# Patient Record
Sex: Male | Born: 1935 | Race: White | Hispanic: No | Smoking: Never smoker
Health system: Southern US, Community
[De-identification: ages and names within clinical notes are randomized; demographics above are authoritative.]

## PROBLEM LIST (undated history)

## (undated) DIAGNOSIS — I10 Essential (primary) hypertension: Secondary | ICD-10-CM

## (undated) DIAGNOSIS — K219 Gastro-esophageal reflux disease without esophagitis: Secondary | ICD-10-CM

## (undated) DIAGNOSIS — E119 Type 2 diabetes mellitus without complications: Secondary | ICD-10-CM

## (undated) DIAGNOSIS — E785 Hyperlipidemia, unspecified: Secondary | ICD-10-CM

## (undated) DIAGNOSIS — N400 Enlarged prostate without lower urinary tract symptoms: Secondary | ICD-10-CM

## (undated) HISTORY — PX: KNEE SURGERY: SHX244

## (undated) HISTORY — PX: HIP FRACTURE SURGERY: SHX118

---

## 2006-10-31 ENCOUNTER — Ambulatory Visit: Payer: Self-pay | Admitting: Gastroenterology

## 2006-11-02 ENCOUNTER — Ambulatory Visit: Payer: Self-pay | Admitting: Gastroenterology

## 2007-12-25 ENCOUNTER — Ambulatory Visit: Payer: Self-pay | Admitting: Gastroenterology

## 2008-01-07 ENCOUNTER — Ambulatory Visit: Payer: Self-pay | Admitting: Gastroenterology

## 2008-05-18 ENCOUNTER — Emergency Department: Payer: Self-pay

## 2013-03-15 ENCOUNTER — Ambulatory Visit: Payer: Self-pay | Admitting: Gastroenterology

## 2013-03-18 LAB — PATHOLOGY REPORT

## 2014-12-02 ENCOUNTER — Other Ambulatory Visit: Payer: Self-pay | Admitting: Physician Assistant

## 2014-12-02 DIAGNOSIS — M79605 Pain in left leg: Secondary | ICD-10-CM

## 2014-12-03 ENCOUNTER — Ambulatory Visit
Admission: RE | Admit: 2014-12-03 | Discharge: 2014-12-03 | Disposition: A | Discharge status: Home / Self Care | Payer: Medicare Other | Source: Ambulatory Visit | Attending: Physician Assistant | Admitting: Physician Assistant

## 2014-12-03 DIAGNOSIS — M79605 Pain in left leg: Secondary | ICD-10-CM | POA: Diagnosis present

## 2014-12-03 DIAGNOSIS — I82412 Acute embolism and thrombosis of left femoral vein: Secondary | ICD-10-CM | POA: Diagnosis not present

## 2015-01-02 ENCOUNTER — Other Ambulatory Visit: Payer: Self-pay | Admitting: Orthopedic Surgery

## 2015-01-02 DIAGNOSIS — M2392 Unspecified internal derangement of left knee: Secondary | ICD-10-CM

## 2015-01-14 ENCOUNTER — Other Ambulatory Visit: Payer: Medicare Other

## 2016-10-21 ENCOUNTER — Emergency Department
Admission: EM | Admit: 2016-10-21 | Discharge: 2016-10-21 | Disposition: A | Discharge status: Home / Self Care | Payer: Medicare Other | Attending: Emergency Medicine | Admitting: Emergency Medicine

## 2016-10-21 ENCOUNTER — Emergency Department: Payer: Medicare Other

## 2016-10-21 DIAGNOSIS — S51012A Laceration without foreign body of left elbow, initial encounter: Secondary | ICD-10-CM | POA: Insufficient documentation

## 2016-10-21 DIAGNOSIS — Z79899 Other long term (current) drug therapy: Secondary | ICD-10-CM | POA: Diagnosis not present

## 2016-10-21 DIAGNOSIS — W010XXA Fall on same level from slipping, tripping and stumbling without subsequent striking against object, initial encounter: Secondary | ICD-10-CM | POA: Diagnosis not present

## 2016-10-21 DIAGNOSIS — R55 Syncope and collapse: Secondary | ICD-10-CM | POA: Diagnosis present

## 2016-10-21 DIAGNOSIS — Y999 Unspecified external cause status: Secondary | ICD-10-CM | POA: Diagnosis not present

## 2016-10-21 DIAGNOSIS — Z23 Encounter for immunization: Secondary | ICD-10-CM | POA: Insufficient documentation

## 2016-10-21 DIAGNOSIS — Y929 Unspecified place or not applicable: Secondary | ICD-10-CM | POA: Insufficient documentation

## 2016-10-21 DIAGNOSIS — Y939 Activity, unspecified: Secondary | ICD-10-CM | POA: Diagnosis not present

## 2016-10-21 DIAGNOSIS — R42 Dizziness and giddiness: Secondary | ICD-10-CM

## 2016-10-21 LAB — CBC WITH DIFFERENTIAL/PLATELET
BASOS PCT: 0 %
Basophils Absolute: 0 10*3/uL (ref 0–0.1)
EOS ABS: 0 10*3/uL (ref 0–0.7)
Eosinophils Relative: 1 %
HCT: 31 % — ABNORMAL LOW (ref 40.0–52.0)
Hemoglobin: 10.8 g/dL — ABNORMAL LOW (ref 13.0–18.0)
Lymphocytes Relative: 12 %
Lymphs Abs: 0.9 10*3/uL — ABNORMAL LOW (ref 1.0–3.6)
MCH: 31.1 pg (ref 26.0–34.0)
MCHC: 34.7 g/dL (ref 32.0–36.0)
MCV: 89.5 fL (ref 80.0–100.0)
MONO ABS: 0.5 10*3/uL (ref 0.2–1.0)
MONOS PCT: 8 %
NEUTROS PCT: 79 %
Neutro Abs: 5.7 10*3/uL (ref 1.4–6.5)
PLATELETS: 221 10*3/uL (ref 150–440)
RBC: 3.46 MIL/uL — ABNORMAL LOW (ref 4.40–5.90)
RDW: 14.4 % (ref 11.5–14.5)
WBC: 7.2 10*3/uL (ref 3.8–10.6)

## 2016-10-21 LAB — HEPATIC FUNCTION PANEL
ALK PHOS: 75 U/L (ref 38–126)
ALT: 20 U/L (ref 17–63)
AST: 30 U/L (ref 15–41)
Albumin: 3.7 g/dL (ref 3.5–5.0)
BILIRUBIN DIRECT: 0.2 mg/dL (ref 0.1–0.5)
BILIRUBIN INDIRECT: 1.1 mg/dL — AB (ref 0.3–0.9)
Total Bilirubin: 1.3 mg/dL — ABNORMAL HIGH (ref 0.3–1.2)
Total Protein: 6.8 g/dL (ref 6.5–8.1)

## 2016-10-21 LAB — URINALYSIS, COMPLETE (UACMP) WITH MICROSCOPIC
Bilirubin Urine: NEGATIVE
GLUCOSE, UA: NEGATIVE mg/dL
HGB URINE DIPSTICK: NEGATIVE
Ketones, ur: NEGATIVE mg/dL
Leukocytes, UA: NEGATIVE
NITRITE: NEGATIVE
Protein, ur: NEGATIVE mg/dL
RBC / HPF: NONE SEEN RBC/hpf (ref 0–5)
SPECIFIC GRAVITY, URINE: 1.008 (ref 1.005–1.030)
Squamous Epithelial / LPF: NONE SEEN
pH: 6 (ref 5.0–8.0)

## 2016-10-21 LAB — BASIC METABOLIC PANEL
Anion gap: 8 (ref 5–15)
BUN: 17 mg/dL (ref 6–20)
CO2: 24 mmol/L (ref 22–32)
Calcium: 9 mg/dL (ref 8.9–10.3)
Chloride: 102 mmol/L (ref 101–111)
Creatinine, Ser: 0.85 mg/dL (ref 0.61–1.24)
GFR calc Af Amer: >60 mL/min (ref >60)
GLUCOSE: 203 mg/dL — AB (ref 65–99)
POTASSIUM: 3.4 mmol/L — AB (ref 3.5–5.1)
Sodium: 134 mmol/L — ABNORMAL LOW (ref 135–145)

## 2016-10-21 LAB — TROPONIN I: Troponin I: <0.03 ng/mL (ref <0.03)

## 2016-10-21 MED ORDER — ASPIRIN 81 MG PO CHEW
324.0000 mg | CHEWABLE_TABLET | Freq: Once | ORAL | Status: AC
  Administered 2016-10-21: 324 mg via ORAL
  Filled 2016-10-21: qty 4

## 2016-10-21 MED ORDER — MECLIZINE HCL 25 MG PO TABS
25.0000 mg | ORAL_TABLET | Freq: Once | ORAL | Status: AC
  Administered 2016-10-21: 25 mg via ORAL
  Filled 2016-10-21: qty 1

## 2016-10-21 MED ORDER — TETANUS-DIPHTH-ACELL PERTUSSIS 5-2.5-18.5 LF-MCG/0.5 IM SUSP
0.5000 mL | Freq: Once | INTRAMUSCULAR | Status: AC
  Administered 2016-10-21: 0.5 mL via INTRAMUSCULAR
  Filled 2016-10-21: qty 0.5

## 2016-10-21 NOTE — Discharge Instructions (Signed)
Please keep your left elbow clean and dry and follow up in the wound care clinic this coming Monday for reevaluation. Return to the emergency department sooner for any new or worsening symptoms such as chest pain, shortness of breath, or for any other issues whatsoever.  It was a pleasure to take care of you today, and thank you for coming to our emergency department.  If you have any questions or concerns before leaving please ask the nurse to grab me and I'm more than happy to go through your aftercare instructions again.  If you were prescribed any opioid pain medication today such as Norco, Vicodin, Percocet, morphine, hydrocodone, or oxycodone please make sure you do not drive when you are taking this medication as it can alter your ability to drive safely.  If you have any concerns once you are home that you are not improving or are in fact getting worse before you can make it to your follow-up appointment, please do not hesitate to call 911 and come back for further evaluation.  Merrily Brittle, MD  Results for orders placed or performed during the hospital encounter of 10/21/16  Basic metabolic panel  Result Value Ref Range   Sodium 134 (L) 135 - 145 mmol/L   Potassium 3.4 (L) 3.5 - 5.1 mmol/L   Chloride 102 101 - 111 mmol/L   CO2 24 22 - 32 mmol/L   Glucose, Bld 203 (H) 65 - 99 mg/dL   BUN 17 6 - 20 mg/dL   Creatinine, Ser 7.57 0.61 - 1.24 mg/dL   Calcium 9.0 8.9 - 97.2 mg/dL   GFR calc non Af Amer >60 >60 mL/min   GFR calc Af Amer >60 >60 mL/min   Anion gap 8 5 - 15  Hepatic function panel  Result Value Ref Range   Total Protein 6.8 6.5 - 8.1 g/dL   Albumin 3.7 3.5 - 5.0 g/dL   AST 30 15 - 41 U/L   ALT 20 17 - 63 U/L   Alkaline Phosphatase 75 38 - 126 U/L   Total Bilirubin 1.3 (H) 0.3 - 1.2 mg/dL   Bilirubin, Direct 0.2 0.1 - 0.5 mg/dL   Indirect Bilirubin 1.1 (H) 0.3 - 0.9 mg/dL  Troponin I  Result Value Ref Range   Troponin I <0.03 <0.03 ng/mL  CBC with Differential    Result Value Ref Range   WBC 7.2 3.8 - 10.6 K/uL   RBC 3.46 (L) 4.40 - 5.90 MIL/uL   Hemoglobin 10.8 (L) 13.0 - 18.0 g/dL   HCT 82.0 (L) 60.1 - 56.1 %   MCV 89.5 80.0 - 100.0 fL   MCH 31.1 26.0 - 34.0 pg   MCHC 34.7 32.0 - 36.0 g/dL   RDW 53.7 94.3 - 27.6 %   Platelets 221 150 - 440 K/uL   Neutrophils Relative % 79 %   Neutro Abs 5.7 1.4 - 6.5 K/uL   Lymphocytes Relative 12 %   Lymphs Abs 0.9 (L) 1.0 - 3.6 K/uL   Monocytes Relative 8 %   Monocytes Absolute 0.5 0.2 - 1.0 K/uL   Eosinophils Relative 1 %   Eosinophils Absolute 0.0 0 - 0.7 K/uL   Basophils Relative 0 %   Basophils Absolute 0.0 0 - 0.1 K/uL  Urinalysis, Complete w Microscopic  Result Value Ref Range   Color, Urine YELLOW (A) YELLOW   APPearance CLEAR (A) CLEAR   Specific Gravity, Urine 1.008 1.005 - 1.030   pH 6.0 5.0 - 8.0  Glucose, UA NEGATIVE NEGATIVE mg/dL   Hgb urine dipstick NEGATIVE NEGATIVE   Bilirubin Urine NEGATIVE NEGATIVE   Ketones, ur NEGATIVE NEGATIVE mg/dL   Protein, ur NEGATIVE NEGATIVE mg/dL   Nitrite NEGATIVE NEGATIVE   Leukocytes, UA NEGATIVE NEGATIVE   RBC / HPF NONE SEEN 0 - 5 RBC/hpf   WBC, UA 0-5 0 - 5 WBC/hpf   Bacteria, UA RARE (A) NONE SEEN   Squamous Epithelial / LPF NONE SEEN NONE SEEN   Mucous PRESENT    Dg Chest 1 View  Result Date: 10/21/2016 CLINICAL DATA:  Weakness. EXAM: CHEST 1 VIEW COMPARISON:  None. FINDINGS: The cardiomediastinal silhouette is normal in size. Normal pulmonary vascularity. Patchy right basilar atelectasis. No focal consolidation, pleural effusion, or pneumothorax. No acute osseous abnormality. Osteopenia. IMPRESSION: No active cardiopulmonary disease. Electronically Signed   By: Obie Dredge M.D.   On: 10/21/2016 12:47   Dg Elbow Complete Left  Result Date: 10/21/2016 CLINICAL DATA:  Larey Seat today with laceration and pain. EXAM: LEFT ELBOW - COMPLETE 3+ VIEW COMPARISON:  None. FINDINGS: Lateral soft tissue deformity. No evidence of fracture, dislocation  or pre-existing degenerative change. Age related chondrocalcinosis. IMPRESSION: Lateral soft tissue injury. No evidence of fracture, dislocation or radiopaque foreign object. Electronically Signed   By: Paulina Fusi M.D.   On: 10/21/2016 12:46   Ct Head Wo Contrast  Result Date: 10/21/2016 CLINICAL DATA:  The patient suffered an episode of dizziness with a fall 10/20/2016. Initial encounter. EXAM: CT HEAD WITHOUT CONTRAST TECHNIQUE: Contiguous axial images were obtained from the base of the skull through the vertex without intravenous contrast. COMPARISON:  None. FINDINGS: Brain: There is cortical atrophy and chronic microvascular ischemic change. No evidence of acute intracranial abnormality including hemorrhage, infarct, mass lesion, mass effect, midline shift or abnormal extra-axial fluid collection. No hydrocephalus or pneumocephalus. Vascular: Atherosclerosis noted. Skull: Intact. Sinuses/Orbits: Minimal ethmoid air cell disease on the left noted. Other: None. IMPRESSION: No acute abnormality. Atrophy and mild chronic microvascular ischemic change. Atherosclerosis. Electronically Signed   By: Drusilla Kanner M.D.   On: 10/21/2016 13:05

## 2016-10-21 NOTE — ED Provider Notes (Signed)
[facility] Emergency Department Provider Note  ____________________________________________   First MD Initiated Contact with Patient 10/21/16 1158     (approximate)  I have reviewed the triage vital signs and the nursing notes.   HISTORY  Chief Complaint Dizziness    HPI [name] is a 83 y.o. male who presents to the emergency department via EMS after a near syncopal event. He said that he felt room spinning vertigo while at lunch lasting several seconds and he fell to the ground striking his left elbow. He doesn't think he hit his head. No loss of consciousness.He denies chest pain or shortness of breath. Last tetanus is unknown.   History reviewed. No pertinent past medical history.  There are no active problems to display for this patient.   Past Surgical History:  Procedure Laterality Date  . KNEE SURGERY      Prior to Admission medications   Medication Sig Start Date End Date Taking? Authorizing Provider  atorvastatin (LIPITOR) 40 MG tablet Take 20 mg by mouth daily. 09/27/16  Yes [provider]  Fe Fum-FePoly-Vit C-Vit B3 (INTEGRA) 62.5-62.5-40-3 MG CAPS Take 1 capsule by mouth daily. 10/12/16  Yes [provider]  latanoprost (XALATAN) 0.005 % ophthalmic solution Place 1 drop into both eyes at bedtime.   Yes [provider]  lisinopril-hydrochlorothiazide (PRINZIDE,ZESTORETIC) 20-12.5 MG tablet Take 1 tablet by mouth daily. 09/27/16  Yes [provider]  omeprazole (PRILOSEC) 40 MG capsule Take 40 mg by mouth daily. 09/27/16  Yes [provider]    Allergies Patient has no known allergies.  No family history on file.  Social History Social History  Substance Use Topics  . Smoking status: Never Smoker  . Smokeless tobacco: Never Used  . Alcohol use No    Review of Systems Constitutional: No fever/chills Eyes: No visual changes. ENT: No sore throat. Cardiovascular: Denies  chest pain. Respiratory: Denies shortness of breath. Gastrointestinal: No abdominal pain.  No nausea, no vomiting.  No diarrhea.  No constipation. Genitourinary: Negative for dysuria. Musculoskeletal: Negative for back pain. Skin: Positive for wound Neurological: Negative for headaches, focal weakness or numbness.   ____________________________________________   PHYSICAL EXAM:  VITAL SIGNS: ED Triage Vitals [10/21/16 1157]  Enc Vitals Group     BP      Pulse      Resp      Temp      Temp src      SpO2      Weight 123 lb 3.8 oz (55.9 kg)     Height 5\' 9"  (1.753 m)     Head Circumference      Peak Flow      Pain Score      Pain Loc      Pain Edu?      Excl. in GC?     Constitutional: Pleasant cooperative quite thin and chronically ill-appearing Eyes: PERRL EOMI. Head: Atraumatic. Nose: No congestion/rhinnorhea. Mouth/Throat: No trismus Neck: No stridor.   Cardiovascular: Normal rate, regular rhythm. Grossly normal heart sounds.  Good peripheral circulation. Respiratory: Normal respiratory effort.  No retractions. Lungs CTAB and moving good air Gastrointestinal: soft nontender Musculoskeletal: No lower extremity edema   Neurologic:  Normal speech and language. No gross focal neurologic deficits are appreciated. Skin:  8 cm skin tear to the lateral aspect of left elbow vascularly intact fully ranging elbow Psychiatric: Mood and affect are normal. Speech and behavior are normal.    ____________________________________________   DIFFERENTIAL  includes but not limited to  Vertigo, central vertigo, peripheral vertigo, cardiogenic syncope, ____________________________________________   LABS (all labs ordered are listed, but only abnormal results are displayed)  Labs Reviewed  BASIC METABOLIC PANEL - Abnormal; Notable for the following:       Result Value   Sodium 134 (*)    Potassium 3.4 (*)    Glucose, Bld 203 (*)    All other components within normal limits    HEPATIC FUNCTION PANEL - Abnormal; Notable for the following:    Total Bilirubin 1.3 (*)    Indirect Bilirubin 1.1 (*)    All other components within normal limits  CBC WITH DIFFERENTIAL/PLATELET - Abnormal; Notable for the following:    RBC 3.46 (*)    Hemoglobin 10.8 (*)    HCT 31.0 (*)    Lymphs Abs 0.9 (*)    All other components within normal limits  URINALYSIS, COMPLETE (UACMP) WITH MICROSCOPIC - Abnormal; Notable for the following:    Color, Urine YELLOW (*)    APPearance CLEAR (*)    Bacteria, UA RARE (*)    All other components within normal limits  TROPONIN I    Blood work unremarkable __________________________________________  EKG  ED ECG REPORT I, Merrily Brittle, the attending physician, personally viewed and interpreted this ECG.  Date: 10/21/2016 EKG Time: 1201 Rate: 88 Rhythm: normal sinus rhythm with sinus arrhythmia QRS Axis: normal Intervals: normal ST/T Wave abnormalities: Inferior ST depression as well as ST depression in V4 V5 no reciprocal ST elevation Narrative Interpretation: Abnormal EKG ________________________________________  RADIOLOGY  Head CT no acute disease elbow x-ray no fracture dislocation chest x-ray no acute disease ____________________________________________   PROCEDURES  Procedure(s) performed:yes  LACERATION REPAIR Performed by: Merrily Brittle Authorized by: Merrily Brittle Consent: Verbal consent obtained. Risks and benefits: risks, benefits and alternatives were discussed Consent given by: patient Patient identity confirmed: provided demographic data Prepped and Draped in normal sterile fashion Wound explored  Laceration Location: left elbow  Laceration Length: 8cm  No Foreign Bodies seen or palpated  Anesthesia: none required    Irrigation method: syringe Amount of cleaning: standard  Skin closure: Steri-Strips and Dermabond    Patient tolerance: Patient tolerated the procedure well with no  immediate complications.   Procedures  Critical Care performed: no  Observation: no ____________________________________________   INITIAL IMPRESSION / ASSESSMENT AND PLAN / ED COURSE  Pertinent labs & imaging results that were available during my care of the patient were reviewed by me and considered in my medical decision making (see chart for details).  The patient arrives with a near syncopal event feeling lightheaded and falling backwards against a wall. He did not strike his head. He does have obvious trauma to the left side of his elbow. He is currently neurovascularly intact. Blood work and head CT as well as x-rays are pending. Tetanus will be updated.  Fortunately the patient's head CT is negative for acute disease. His elbow wound was washed out and closed with Steri-Strips andDermabond. Doubt acute coronary syndrome or cardiac dysrhythmia. He'll be discharged home with primary care follow-up as well as wound care follow-up.      ____________________________________________   FINAL CLINICAL IMPRESSION(S) / ED DIAGNOSES  Final diagnoses:  Near syncope  Lightheaded  Skin tear of left elbow without complication, initial encounter      NEW MEDICATIONS STARTED DURING THIS VISIT:  Discharge Medication List as of 10/21/2016  3:38 PM       Note:  This document  was prepared using Conservation officer, historic buildings and may include unintentional dictation errors.     Merrily Brittle, MD 10/24/16 [phone]

## 2016-10-21 NOTE — ED Notes (Signed)
Pt transported to xray 

## 2016-10-21 NOTE — ED Notes (Signed)
Pt skin tear cleaned. Dr. Lamont Snowball at bedside applying steri strips and dermabond.

## 2016-10-21 NOTE — ED Triage Notes (Signed)
Pt came to ED via EMS from restaurant. Felt dizzy and took a fall. Small abrasion to head, skin tear to left arm. Was feeling dizzy yesterday as well. Alert and oriented, vs stable.

## 2016-10-28 ENCOUNTER — Encounter: Payer: Medicare Other | Attending: Surgery | Admitting: Surgery

## 2016-10-28 DIAGNOSIS — M109 Gout, unspecified: Secondary | ICD-10-CM | POA: Insufficient documentation

## 2016-10-28 DIAGNOSIS — N182 Chronic kidney disease, stage 2 (mild): Secondary | ICD-10-CM | POA: Diagnosis not present

## 2016-10-28 DIAGNOSIS — X58XXXA Exposure to other specified factors, initial encounter: Secondary | ICD-10-CM | POA: Insufficient documentation

## 2016-10-28 DIAGNOSIS — E11622 Type 2 diabetes mellitus with other skin ulcer: Secondary | ICD-10-CM | POA: Diagnosis not present

## 2016-10-28 DIAGNOSIS — N400 Enlarged prostate without lower urinary tract symptoms: Secondary | ICD-10-CM | POA: Insufficient documentation

## 2016-10-28 DIAGNOSIS — E785 Hyperlipidemia, unspecified: Secondary | ICD-10-CM | POA: Insufficient documentation

## 2016-10-28 DIAGNOSIS — M1990 Unspecified osteoarthritis, unspecified site: Secondary | ICD-10-CM | POA: Diagnosis not present

## 2016-10-28 DIAGNOSIS — S51012A Laceration without foreign body of left elbow, initial encounter: Secondary | ICD-10-CM | POA: Diagnosis not present

## 2016-10-28 DIAGNOSIS — E1140 Type 2 diabetes mellitus with diabetic neuropathy, unspecified: Secondary | ICD-10-CM | POA: Insufficient documentation

## 2016-10-28 DIAGNOSIS — Z87891 Personal history of nicotine dependence: Secondary | ICD-10-CM | POA: Insufficient documentation

## 2016-10-28 DIAGNOSIS — I129 Hypertensive chronic kidney disease with stage 1 through stage 4 chronic kidney disease, or unspecified chronic kidney disease: Secondary | ICD-10-CM | POA: Diagnosis not present

## 2016-10-28 DIAGNOSIS — S51002A Unspecified open wound of left elbow, initial encounter: Secondary | ICD-10-CM | POA: Diagnosis not present

## 2016-10-28 DIAGNOSIS — E1122 Type 2 diabetes mellitus with diabetic chronic kidney disease: Secondary | ICD-10-CM | POA: Diagnosis not present

## 2016-10-30 NOTE — Progress Notes (Signed)
[name] ([id number]) Visit Report for 10/28/2016 Chief Complaint Document Details Patient Name: [name] Date of Service: 10/28/2016 10:30 AM Medical Record Number: [id number] Patient Account Number: [id number] Date of Birth/Sex: June 23, 1926 (83 y.o. Male) Treating RN: Phillis Haggis Primary Care Provider: Einar Crow Other Clinician: Referring Provider: Merrily Brittle Treating Provider/Extender: Rudene Re in Treatment: 0 Information Obtained from: Patient Chief Complaint Patient seen for complaints of Non-Healing Wound to the left elbow region and lateral forearm which she's had for a week Electronic Signature(s) Signed: 10/28/2016 11:54:07 AM By: Evlyn Kanner MD, FACS Entered By: Evlyn Kanner on 10/28/2016 11:12:53 [name] ([id number]) -------------------------------------------------------------------------------- Debridement Details Patient Name: [name] Date of Service: 10/28/2016 10:30 AM Medical Record Number: [id number] Patient Account Number: [id number] Date of Birth/Sex: [birth year] (83 y.o. Male) Treating RN: Phillis Haggis Primary Care Provider: Einar Crow Other Clinician: Referring Provider: Merrily Brittle Treating Provider/Extender: Rudene Re in Treatment: 0 Debridement Performed for Wound #1 Left Elbow Assessment: Performed By: Physician Evlyn Kanner, MD Debridement: Debridement Pre-procedure Verification/Time Out Yes - 11:01 Taken: Start Time: 11:02 Pain Control: Lidocaine 4% Topical Solution Level: Skin/Subcutaneous Tissue Total Area Debrided (L x 3 (cm) x 3 (cm) = 9 (cm) W): Tissue and other Viable, Non-Viable, Exudate, Fibrin/Slough, Subcutaneous material debrided: Instrument: Forceps, Scissors Bleeding: Minimum Hemostasis Achieved: Pressure End Time: 11:04 Procedural Pain: 0 Post Procedural Pain: 0 Response to Treatment: Procedure was tolerated well Post  Debridement Measurements of Total Wound Length: (cm) 7.8 Width: (cm) 3.9 Depth: (cm) 0.1 Volume: (cm) 2.389 Character of Wound/Ulcer Post Requires Further Debridement Debridement: Post Procedure Diagnosis Same as Pre-procedure Electronic Signature(s) Signed: 10/28/2016 11:54:07 AM By: Evlyn Kanner MD, FACS Signed: 10/28/2016 4:07:04 PM By: Alejandro Mulling Entered By: Evlyn Kanner on 10/28/2016 11:12:31 [name], [name] ([id number]) -------------------------------------------------------------------------------- HPI Details Patient Name: [name] Date of Service: 10/28/2016 10:30 AM Medical Record Number: [id number] Patient Account Number: [id number] Date of Birth/Sex: 1926/06/05 (83 y.o. Male) Treating RN: Phillis Haggis Primary Care Provider: Einar Crow Other Clinician: Referring Provider: Merrily Brittle Treating Provider/Extender: Rudene Re in Treatment: 0 History of Present Illness Location: left lateral forearm and elbow Quality: Patient reports experiencing a sharp pain to affected area(s). Severity: Patient states wound are getting worse. Duration: Patient has had the wound for < 2 weeks prior to presenting for treatment Timing: Pain in wound is Intermittent (comes and goes Context: The wound occurred when the patient had a syncopal attack and fall Modifying Factors: Other treatment(s) tried include:went to the ER for Steri-Strips and Dermabond Associated Signs and Symptoms: Patient reports having increase discharge. HPI Description: 83 year old patient had a syncopal attack and a fall on 10/21/2016 and had a injury to his left elbow which was treated in the ER after appropriate x-rays were reviewed. The wound was closed with Steri-Strips and Dermabond and was asked to follow-up at the wound center. past medical history significant for type 2 diabetes mellitus, hyperlipidemia, benign hypertension, stage II chronic kidney disease, gout,  prostatic hypertrophy, status post EGD and colonoscope he and right hip fracture repair. he has never been a smoker. Lab work done on 09/20/2016 was reviewed and his hemoglobin A1c was 6.3% Electronic Signature(s) Signed: 10/28/2016 11:54:07 AM By: Evlyn Kanner MD, FACS Entered By: Evlyn Kanner on 10/28/2016 11:13:47 [name], [name] ([id number]) -------------------------------------------------------------------------------- Physical Exam Details Patient Name: [name] Date of Service: 10/28/2016 10:30 AM Medical Record Number: [id number] Patient Account Number: [id number] Date of Birth/Sex: Jan 31, 1927 (83 y.o. Male) Treating RN:  Ashok Cordia Debi Primary Care Provider: Einar Crow Other Clinician: Referring Provider: Merrily Brittle Treating Provider/Extender: Rudene Re in Treatment: 0 Constitutional . Pulse regular. Respirations normal and unlabored. Afebrile. . Eyes Nonicteric. Reactive to light. Ears, Nose, Mouth, and Throat Lips, teeth, and gums WNL.Marland Kitchen Moist mucosa without lesions. Neck supple and nontender. No palpable supraclavicular or cervical adenopathy. Normal sized without goiter. Respiratory WNL. No retractions.. Cardiovascular Pedal Pulses WNL. No clubbing, cyanosis or edema. Gastrointestinal (GI) Abdomen without masses or tenderness.. No liver or spleen enlargement or tenderness.. Lymphatic No adneopathy. No adenopathy. No adenopathy. Musculoskeletal Adexa without tenderness or enlargement.. Digits and nails w/o clubbing, cyanosis, infection, petechiae, ischemia, or inflammatory conditions.. Integumentary (Hair, Skin) No suspicious lesions. No crepitus or fluctuance. No peri-wound warmth or erythema. No masses.Marland Kitchen Psychiatric Judgement and insight Intact.. No evidence of depression, anxiety, or agitation.. Notes the patient has had a lacerated wound which has a lot of dead necrotic skin and using a forceps and scissors I have  sharply removed all of this and wash the wound was cleaned with saline and bleeding controlled with pressure Electronic Signature(s) Signed: 10/28/2016 11:54:07 AM By: Evlyn Kanner MD, FACS Entered By: Evlyn Kanner on 10/28/2016 11:14:20 [name] ([id number]) -------------------------------------------------------------------------------- Physician Orders Details Patient Name: [name] Date of Service: 10/28/2016 10:30 AM Medical Record Number: [id number] Patient Account Number: [id number] Date of Birth/Sex: 1926/08/06 (83 y.o. Male) Treating RN: Phillis Haggis Primary Care Provider: Einar Crow Other Clinician: Referring Provider: Merrily Brittle Treating Provider/Extender: Rudene Re in Treatment: 0 Verbal / Phone Orders: No Diagnosis Coding Wound Cleansing Wound #1 Left Elbow o Clean wound with Normal Saline. o Cleanse wound with mild soap and water o May Shower, gently pat wound dry prior to applying new dressing. Anesthetic Wound #1 Left Elbow o Topical Lidocaine 4% cream applied to wound bed prior to debridement Primary Wound Dressing Wound #1 Left Elbow o Aquacel Ag Secondary Dressing Wound #1 Left Elbow o ABD pad o Conform/Kerlix o Other - stretch netting #4 Dressing Change Frequency Wound #1 Left Elbow o Change dressing every other day. Follow-up Appointments Wound #1 Left Elbow o Return Appointment in 1 week. Additional Orders / Instructions Wound #1 Left Elbow o Increase protein intake. Electronic Signature(s) Signed: 10/28/2016 11:54:07 AM By: Evlyn Kanner MD, FACS XADRIAN, CRAIGHEAD ([id number]) Signed: 10/28/2016 4:07:04 PM By: Alejandro Mulling Entered By: Alejandro Mulling on 10/28/2016 11:06:29 [name], [name] ([id number]) -------------------------------------------------------------------------------- Problem List Details Patient Name: [name] Date of Service:  10/28/2016 10:30 AM Medical Record Number: [id number] Patient Account Number: [id number] Date of Birth/Sex: 03-18-1926 (83 y.o. Male) Treating RN: Phillis Haggis Primary Care Provider: Einar Crow Other Clinician: Referring Provider: Merrily Brittle Treating Provider/Extender: Rudene Re in Treatment: 0 Active Problems ICD-10 Encounter Code Description Active Date Diagnosis E11.622 Type 2 diabetes mellitus with other skin ulcer 10/28/2016 Yes S51.012A Laceration without foreign body of left elbow, initial 10/28/2016 Yes encounter S51.002A Unspecified open wound of left elbow, initial encounter 10/28/2016 Yes Inactive Problems Resolved Problems Electronic Signature(s) Signed: 10/28/2016 11:54:07 AM By: Evlyn Kanner MD, FACS Entered By: Evlyn Kanner on 10/28/2016 11:12:12 [name] ([id number]) -------------------------------------------------------------------------------- Progress Note Details Patient Name: [name] Date of Service: 10/28/2016 10:30 AM Medical Record Number: [id number] Patient Account Number: [id number] Date of Birth/Sex: Apr 01, 1926 (83 y.o. Male) Treating RN: Phillis Haggis Primary Care Provider: Einar Crow Other Clinician: Referring Provider: Merrily Brittle Treating Provider/Extender: Rudene Re in Treatment: 0 Subjective Chief Complaint Information obtained from Patient Patient  seen for complaints of Non-Healing Wound to the left elbow region and lateral forearm which she's had for a week History of Present Illness (HPI) The following HPI elements were documented for the patient's wound: Location: left lateral forearm and elbow Quality: Patient reports experiencing a sharp pain to affected area(s). Severity: Patient states wound are getting worse. Duration: Patient has had the wound for < 2 weeks prior to presenting for treatment Timing: Pain in wound is Intermittent (comes and goes Context: The  wound occurred when the patient had a syncopal attack and fall Modifying Factors: Other treatment(s) tried include:went to the ER for Steri-Strips and Dermabond Associated Signs and Symptoms: Patient reports having increase discharge. 83 year old patient had a syncopal attack and a fall on 10/21/2016 and had a injury to his left elbow which was treated in the ER after appropriate x-rays were reviewed. The wound was closed with Steri-Strips and Dermabond and was asked to follow-up at the wound center. past medical history significant for type 2 diabetes mellitus, hyperlipidemia, benign hypertension, stage II chronic kidney disease, gout, prostatic hypertrophy, status post EGD and colonoscope he and right hip fracture repair. he has never been a smoker. Lab work done on 09/20/2016 was reviewed and his hemoglobin A1c was 6.3% Wound History Patient presents with 1 open wound that has been present for approximately 1 week. Patient has been treating wound in the following manner: bandage. Laboratory tests have not been performed in the last month. Patient reportedly has not tested positive for an antibiotic resistant organism. Patient reportedly has not tested positive for osteomyelitis. Patient reportedly has not had testing performed to evaluate circulation in the legs. Patient experiences the following problems associated with their wounds: swelling. Patient History Information obtained from Patient. Allergies NKDA Butterbaugh, JEDAIAH SETLOCK ([id number]) Family History Cancer - Father, Diabetes - Father, No family history of Heart Disease, Hereditary Spherocytosis, Hypertension, Kidney Disease, Lung Disease, Seizures, Stroke, Thyroid Problems, Tuberculosis. Social History Former smoker - chewed tobacco, Marital Status - Widowed, Alcohol Use - Never, Drug Use - No History, Caffeine Use - Daily. Medical History Hematologic/Lymphatic Patient has history of Anemia Cardiovascular Patient has  history of Hypertension Endocrine Patient has history of Type II Diabetes Musculoskeletal Patient has history of Osteoarthritis Neurologic Patient has history of Neuropathy Patient is treated with Controlled Diet. Blood sugar is tested. Review of Systems (ROS) Constitutional Symptoms (General Health) The patient has no complaints or symptoms. Eyes The patient has no complaints or symptoms. Ear/Nose/Mouth/Throat The patient has no complaints or symptoms. Respiratory The patient has no complaints or symptoms. Cardiovascular hyperlipidemia Gastrointestinal Complains or has symptoms of Frequent diarrhea, gerd Genitourinary Complains or has symptoms of Incontinence/dribbling. Immunological The patient has no complaints or symptoms. Integumentary (Skin) The patient has no complaints or symptoms. Oncologic The patient has no complaints or symptoms. Psychiatric The patient has no complaints or symptoms. [name], [name] ([id number]) Objective Constitutional Pulse regular. Respirations normal and unlabored. Afebrile. Vitals Time Taken: 10:26 AM, Height: 69 in, Source: Stated, Weight: 125 lbs, Source: Measured, BMI: 18.5, Pulse: 62 bpm, Respiratory Rate: 16 breaths/min, Blood Pressure: 126/77 mmHg. Eyes Nonicteric. Reactive to light. Ears, Nose, Mouth, and Throat Lips, teeth, and gums WNL.Marland Kitchen Moist mucosa without lesions. Neck supple and nontender. No palpable supraclavicular or cervical adenopathy. Normal sized without goiter. Respiratory WNL. No retractions.. Cardiovascular Pedal Pulses WNL. No clubbing, cyanosis or edema. Gastrointestinal (GI) Abdomen without masses or tenderness.. No liver or spleen enlargement or tenderness.. Lymphatic No adneopathy. No adenopathy. No adenopathy. Musculoskeletal Adexa  without tenderness or enlargement.. Digits and nails w/o clubbing, cyanosis, infection, petechiae, ischemia, or inflammatory conditions.Marland Kitchen Psychiatric Judgement and  insight Intact.. No evidence of depression, anxiety, or agitation.. General Notes: the patient has had a lacerated wound which has a lot of dead necrotic skin and using a forceps and scissors I have sharply removed all of this and wash the wound was cleaned with saline and bleeding controlled with pressure Integumentary (Hair, Skin) No suspicious lesions. No crepitus or fluctuance. No peri-wound warmth or erythema. No masses.Marland Kitchen JUVENAL, UMAR ([id number]) Wound #1 status is Open. Original cause of wound was Trauma. The wound is located on the Left Elbow. The wound measures 7.8cm length x 3.9cm width x 0.1cm depth; 23.892cm^2 area and 2.389cm^3 volume. There is no tunneling or undermining noted. There is a large amount of serosanguineous drainage noted. The wound margin is distinct with the outline attached to the wound base. There is small (1-33%) red, pink granulation within the wound bed. There is a large (67-100%) amount of necrotic tissue within the wound bed including Adherent Slough. The periwound skin appearance exhibited: Maceration, Erythema. The surrounding wound skin color is noted with erythema which is circumferential. Periwound temperature was noted as No Abnormality. The periwound has tenderness on palpation. Assessment Active Problems ICD-10 E11.622 - Type 2 diabetes mellitus with other skin ulcer S51.012A - Laceration without foreign body of left elbow, initial encounter S51.002A - Unspecified open wound of left elbow, initial encounter the patient has a open lacerated wound on the left elbow which after debridement needs local care and I have recommended: 1. Silver alginate and aborted for him to be applied every other day 2. May wash with soap and water 3. Continue good care of his diet-controlled diabetes mellitus 4. Regular visits the wound center Procedures Wound #1 Pre-procedure diagnosis of Wound #1 is a Trauma, Other located on the Left Elbow . There was  a Skin/Subcutaneous Tissue Debridement ([id number]) debridement with total area of 9 sq cm performed by Evlyn Kanner, MD. with the following instrument(s): Forceps and Scissors to remove Viable and Non- Viable tissue/material including Exudate, Fibrin/Slough, and Subcutaneous after achieving pain control using Lidocaine 4% Topical Solution. A time out was conducted at 11:01, prior to the start of the procedure. A Minimum amount of bleeding was controlled with Pressure. The procedure was tolerated well with a pain level of 0 throughout and a pain level of 0 following the procedure. Post Debridement Measurements: 7.8cm length x 3.9cm width x 0.1cm depth; 2.389cm^3 volume. Character of Wound/Ulcer Post Debridement requires further debridement. Post procedure Diagnosis Wound #1: Same as Pre-Procedure Schooley, Chritopher J. ([id number]) Plan Wound Cleansing: Wound #1 Left Elbow: Clean wound with Normal Saline. Cleanse wound with mild soap and water May Shower, gently pat wound dry prior to applying new dressing. Anesthetic: Wound #1 Left Elbow: Topical Lidocaine 4% cream applied to wound bed prior to debridement Primary Wound Dressing: Wound #1 Left Elbow: Aquacel Ag Secondary Dressing: Wound #1 Left Elbow: ABD pad Conform/Kerlix Other - stretch netting #4 Dressing Change Frequency: Wound #1 Left Elbow: Change dressing every other day. Follow-up Appointments: Wound #1 Left Elbow: Return Appointment in 1 week. Additional Orders / Instructions: Wound #1 Left Elbow: Increase protein intake. the patient has a open lacerated wound on the left elbow which after debridement needs local care and I have recommended: 1. Silver alginate and aborted for him to be applied every other day 2. May wash with soap and water 3. Continue good care of  his diet-controlled diabetes mellitus 4. Regular visits the wound center Electronic Signature(s) Signed: 10/28/2016 3:48:30 PM By: Evlyn Kanner MD,  FACS Previous Signature: 10/28/2016 11:54:07 AM Version By: Evlyn Kanner MD, FACS Entered By: Evlyn Kanner on 10/28/2016 11:56:03 [name], [name] ([id number]) [name], [name] ([id number]) -------------------------------------------------------------------------------- ROS/PFSH Details Patient Name: [name] Date of Service: 10/28/2016 10:30 AM Medical Record Number: [id number] Patient Account Number: [id number] Date of Birth/Sex: 05/13/1926 (83 y.o. Male) Treating RN: Phillis Haggis Primary Care Provider: Einar Crow Other Clinician: Referring Provider: Merrily Brittle Treating Provider/Extender: Rudene Re in Treatment: 0 Information Obtained From Patient Wound History Do you currently have one or more open woundso Yes How many open wounds do you currently haveo 1 Approximately how long have you had your woundso 1 week How have you been treating your wound(s) until nowo bandage Has your wound(s) ever healed and then re-openedo No Have you had any lab work done in the past montho No Have you tested positive for an antibiotic resistant organism (MRSA, VRE)o No Have you tested positive for osteomyelitis (bone infection)o No Have you had any tests for circulation on your legso No Have you had other problems associated with your woundso Swelling Gastrointestinal Complaints and Symptoms: Positive for: Frequent diarrhea Review of System Notes: gerd Genitourinary Complaints and Symptoms: Positive for: Incontinence/dribbling Constitutional Symptoms (General Health) Complaints and Symptoms: No Complaints or Symptoms Eyes Complaints and Symptoms: No Complaints or Symptoms Ear/Nose/Mouth/Throat Complaints and Symptoms: No Complaints or Symptoms Hematologic/Lymphatic [name], [name] ([id number]) Medical History: Positive for: Anemia Respiratory Complaints and Symptoms: No Complaints or Symptoms Cardiovascular Complaints and  Symptoms: Review of System Notes: hyperlipidemia Medical History: Positive for: Hypertension Endocrine Medical History: Positive for: Type II Diabetes Time with diabetes: 15 years Treated with: Diet Blood sugar tested every day: Yes Tested : Immunological Complaints and Symptoms: No Complaints or Symptoms Integumentary (Skin) Complaints and Symptoms: No Complaints or Symptoms Musculoskeletal Medical History: Positive for: Osteoarthritis Neurologic Medical History: Positive for: Neuropathy Oncologic Complaints and Symptoms: No Complaints or Symptoms Psychiatric [name], Christian COEY. ([id number]) Complaints and Symptoms: No Complaints or Symptoms Immunizations Pneumococcal Vaccine: Received Pneumococcal Vaccination: No Family and Social History Cancer: Yes - Father; Diabetes: Yes - Father; Heart Disease: No; Hereditary Spherocytosis: No; Hypertension: No; Kidney Disease: No; Lung Disease: No; Seizures: No; Stroke: No; Thyroid Problems: No; Tuberculosis: No; Former smoker - chewed tobacco; Marital Status - Widowed; Alcohol Use: Never; Drug Use: No History; Caffeine Use: Daily; Financial Concerns: No; Food, Clothing or Shelter Needs: No; Support System Lacking: No; Transportation Concerns: No; Advanced Directives: No; Patient does not want information on Advanced Directives; Do not resuscitate: No; Living Will: Yes (Not Provided); Medical Power of Attorney: Yes - Chinita Greenland (daughter) (Not Provided) Physician Affirmation I have reviewed and agree with the above information. Electronic Signature(s) Signed: 10/28/2016 11:54:07 AM By: Evlyn Kanner MD, FACS Signed: 10/28/2016 4:07:04 PM By: Alejandro Mulling Entered By: Alejandro Mulling on 10/28/2016 11:21:06 [name], [name] ([id number]) -------------------------------------------------------------------------------- SuperBill Details Patient Name: [name] Date of Service: 10/28/2016 Medical Record  Number: [id number] Patient Account Number: [id number] Date of Birth/Sex: 12-Dec-1926 (83 y.o. Male) Treating RN: Phillis Haggis Primary Care Provider: Einar Crow Other Clinician: Referring Provider: Merrily Brittle Treating Provider/Extender: Rudene Re in Treatment: 0 Diagnosis Coding ICD-10 Codes Code Description E11.622 Type 2 diabetes mellitus with other skin ulcer S51.012A Laceration without foreign body of left elbow, initial encounter S51.002A Unspecified open wound of left elbow, initial encounter Facility Procedures CPT4 Code: 75643329 Description:  99213 - WOUND CARE VISIT-LEV 3 EST PT Modifier: Quantity: 1 CPT4 Code: 16109604 Description: 11042 - DEB SUBQ TISSUE 20 SQ CM/< ICD-10 Description Diagnosis E11.622 Type 2 diabetes mellitus with other skin ulcer S51.012A Laceration without foreign body of left elbow, init S51.002A Unspecified open wound of left elbow, initial encou Modifier: ial encounter nter Quantity: 1 Physician Procedures CPT4 Code: 5409811 Description: 99204 - WC PHYS LEVEL 4 - NEW PT ICD-10 Description Diagnosis E11.622 Type 2 diabetes mellitus with other skin ulcer S51.012A Laceration without foreign body of left elbow, init S51.002A Unspecified open wound of left elbow, initial encou Modifier: 25 ial encounter nter Quantity: 1 CPT4 Code: [id number] [place], CL Description: 11042 - WC PHYS SUBQ TISS 20 SQ CM ICD-10 Description Diagnosis E11.622 Type 2 diabetes mellitus with other skin ulcer S51.012A Laceration without foreign body of left elbow, init S51.002A Unspecified open wound of left elbow, initial encou  ARENCE J. ([id number]) Modifier: ial encounter nter Quantity: 1 Electronic Signature(s) Signed: 10/28/2016 11:54:07 AM By: Evlyn Kanner MD, FACS Signed: 10/28/2016 4:07:04 PM By: Alejandro Mulling Entered By: Alejandro Mulling on 10/28/2016 11:32:22

## 2016-10-30 NOTE — Progress Notes (Signed)
XZAVIER, SWINGER ([id number]) Visit Report for 10/28/2016 Abuse/Suicide Risk Screen Details Patient Name: [name], [name] Date of Service: 10/28/2016 10:30 AM Medical Record Number: [id number] Patient Account Number: [id number] Date of Birth/Sex: [birth year] (83 y.o. Male) Treating RN: Phillis Haggis Primary Care Rylan Kaufmann: Einar Crow Other Clinician: Referring Jedrick Hutcherson: Merrily Brittle Treating Adler Chartrand/Extender: Rudene Re in Treatment: 0 Abuse/Suicide Risk Screen Items Answer ABUSE/SUICIDE RISK SCREEN: Has anyone close to you tried to hurt or harm you recentlyo No Do you feel uncomfortable with anyone in your familyo No Has anyone forced you do things that you didnot want to doo No Do you have any thoughts of harming yourselfo No Patient displays signs or symptoms of abuse and/or neglect. No Electronic Signature(s) Signed: 10/28/2016 4:07:04 PM By: Alejandro Mulling Entered By: Alejandro Mulling on 10/28/2016 10:39:38 Guyett, Corinna Gab ([id number]) -------------------------------------------------------------------------------- Activities of Daily Living Details Patient Name: [name] Date of Service: 10/28/2016 10:30 AM Medical Record Number: [id number] Patient Account Number: [id number] Date of Birth/Sex: April 10, 1926 (83 y.o. Male) Treating RN: Phillis Haggis Primary Care Tacori Kvamme: Einar Crow Other Clinician: Referring Arelys Glassco: Merrily Brittle Treating Karlin Heilman/Extender: Rudene Re in Treatment: 0 Activities of Daily Living Items Answer Activities of Daily Living (Please select one for each item) Drive Automobile Completely Able Take Medications Completely Able Use Telephone Completely Able Care for Appearance Completely Able Use Toilet Completely Able Bath / Shower Completely Able Dress Self Completely Able Feed Self Completely Able Walk Completely Able Get In / Out Bed Completely Able Housework Completely  Able Prepare Meals Completely Able Handle Money Completely Able Shop for Self Completely Able Electronic Signature(s) Signed: 10/28/2016 4:07:04 PM By: Alejandro Mulling Entered By: Alejandro Mulling on 10/28/2016 10:40:01 [name] ([id number]) -------------------------------------------------------------------------------- Education Assessment Details Patient Name: [name] Date of Service: 10/28/2016 10:30 AM Medical Record Number: [id number] Patient Account Number: [id number] Date of Birth/Sex: [birth year] (83 y.o. Male) Treating RN: Phillis Haggis Primary Care Vanshika Jastrzebski: Einar Crow Other Clinician: Referring Sherin Murdoch: Merrily Brittle Treating Azzan Butler/Extender: Rudene Re in Treatment: 0 Primary Learner Assessed: Patient Learning Preferences/Education Level/Primary Language Learning Preference: Explanation, Printed Material Highest Education Level: Grade School Preferred Language: English Cognitive Barrier Assessment/Beliefs Language Barrier: No Translator Needed: No Memory Deficit: No Emotional Barrier: No Cultural/Religious Beliefs Affecting Medical No Care: Physical Barrier Assessment Impaired Vision: No Impaired Hearing: Yes HOH Decreased Hand dexterity: No Knowledge/Comprehension Assessment Knowledge Level: Medium Comprehension Level: Medium Ability to understand written Medium instructions: Ability to understand verbal Medium instructions: Motivation Assessment Anxiety Level: Calm Cooperation: Cooperative Education Importance: Acknowledges Need Interest in Health Problems: Asks Questions Perception: Coherent Willingness to Engage in Self- Medium Management Activities: Readiness to Engage in Self- Medium Management Activities: Electronic Signature(s) CONTRELL, BALLENTINE ([id number]) Signed: 10/28/2016 4:07:04 PM By: Alejandro Mulling Entered By: Alejandro Mulling on 10/28/2016 10:40:27 TOURE, EDMONDS  ([id number]) -------------------------------------------------------------------------------- Fall Risk Assessment Details Patient Name: [name] Date of Service: 10/28/2016 10:30 AM Medical Record Number: [id number] Patient Account Number: [id number] Date of Birth/Sex: March 15, 1926 (83 y.o. Male) Treating RN: Phillis Haggis Primary Care Catrena Vari: Einar Crow Other Clinician: Referring Dejai Schubach: Merrily Brittle Treating Reshma Hoey/Extender: Rudene Re in Treatment: 0 Fall Risk Assessment Items Have you had 2 or more falls in the last 12 monthso 0 No Have you had any fall that resulted in injury in the last 12 monthso 0 No FALL RISK ASSESSMENT: History of falling - immediate or within 3 months 25 Yes Secondary diagnosis 15 Yes Ambulatory aid None/bed rest/wheelchair/nurse 0 Yes Crutches/cane/walker 15 Yes  Furniture 0 No IV Access/Saline Lock 0 No Gait/Training Normal/bed rest/immobile 0 No Weak 0 No Impaired 0 No Mental Status Oriented to own ability 0 Yes Electronic Signature(s) Signed: 10/28/2016 4:07:04 PM By: Alejandro Mulling Entered By: Alejandro Mulling on 10/28/2016 10:41:34 Mcbee, Corinna Gab ([id number]) -------------------------------------------------------------------------------- Foot Assessment Details Patient Name: [name] Date of Service: 10/28/2016 10:30 AM Medical Record Number: [id number] Patient Account Number: [id number] Date of Birth/Sex: 02/06/1927 (83 y.o. Male) Treating RN: Phillis Haggis Primary Care Taylynn Easton: Einar Crow Other Clinician: Referring Asberry Lascola: Merrily Brittle Treating David Towson/Extender: Rudene Re in Treatment: 0 Foot Assessment Items Site Locations + = Sensation present, - = Sensation absent, C = Callus, U = Ulcer R = Redness, W = Warmth, M = Maceration, PU = Pre-ulcerative lesion F = Fissure, S = Swelling, D = Dryness Assessment Right: Left: Other Deformity: No No Prior Foot  Ulcer: No No Prior Amputation: No No Charcot Joint: No No Ambulatory Status: Gait: Electronic Signature(s) Signed: 10/28/2016 4:07:04 PM By: Alejandro Mulling Entered By: Alejandro Mulling on 10/28/2016 10:41:52 Bottoms, Corinna Gab ([id number]) -------------------------------------------------------------------------------- Nutrition Risk Assessment Details Patient Name: [name] Date of Service: 10/28/2016 10:30 AM Medical Record Number: [id number] Patient Account Number: [id number] Date of Birth/Sex: 1927/02/01 (83 y.o. Male) Treating RN: Phillis Haggis Primary Care Moraima Burd: Einar Crow Other Clinician: Referring Velina Drollinger: Merrily Brittle Treating Briarrose Shor/Extender: Rudene Re in Treatment: 0 Height (in): 69 Weight (lbs): 125 Body Mass Index (BMI): 18.5 Nutrition Risk Assessment Items NUTRITION RISK SCREEN: I have an illness or condition that made me change the kind and/or 2 Yes amount of food I eat I eat fewer than two meals per day 0 No I eat few fruits and vegetables, or milk products 0 No I have three or more drinks of beer, liquor or wine almost every day 0 No I have tooth or mouth problems that make it hard for me to eat 0 No I don't always have enough money to buy the food I need 0 No I eat alone most of the time 0 No I take three or more different prescribed or over-the-counter drugs a 1 Yes day Without wanting to, I have lost or gained 10 pounds in the last six 0 No months I am not always physically able to shop, cook and/or feed myself 0 No Nutrition Protocols Good Risk Protocol Moderate Risk Protocol Electronic Signature(s) Signed: 10/28/2016 4:07:04 PM By: Alejandro Mulling Entered By: Alejandro Mulling on 10/28/2016 10:41:46

## 2016-10-30 NOTE — Progress Notes (Signed)
[name], [name] ([id number]) Visit Report for 10/28/2016 Allergy List Details Patient Name: [name], [name] Date of Service: 10/28/2016 10:30 AM Medical Record Number: [id number] Patient Account Number: [id number] Date of Birth/Sex: 09/29/1926 (83 y.o. Male) Treating RN: [name] Primary Care [name]: [name] Other Clinician: Referring [name]: [name] Treating [name]/Extender: [name] in Treatment: 0 Allergies Active Allergies NKDA Allergy Notes Electronic Signature(s) Signed: 10/28/2016 4:07:04 PM By: [name] Entered By: [name] on 10/28/2016 10:30:36 [name] ([id number]) -------------------------------------------------------------------------------- Arrival Information Details Patient Name: [name] Date of Service: 10/28/2016 10:30 AM Medical Record Number: [id number] Patient Account Number: [id number] Date of Birth/Sex: [birth year] (83 y.o. Male) Treating RN: [name] Primary Care Ziyon Cedotal: [name] Other Clinician: Referring [name]: [name] Treating [name]/Extender: [name] in Treatment: 0 Visit Information Patient Arrived: Wheel Chair Arrival Time: 10:22 Accompanied By: daughter and granddaughter Transfer Assistance: EasyPivot Patient Lift Patient Identification Verified: Yes Secondary Verification Yes Process Completed: Patient Requires No Transmission-Based Precautions: Patient Has Alerts: Yes Patient Alerts: DM II Electronic Signature(s) Signed: 10/28/2016 4:07:04 PM By: [name] Entered By: [name] on 10/28/2016 11:20:51 [name], [name] ([id number]) -------------------------------------------------------------------------------- Clinic Level of Care Assessment Details Patient Name: [name] Date of Service: 10/28/2016 10:30 AM Medical Record Number: [id number] Patient Account Number:  [id number] Date of Birth/Sex: 1926/09/30 (83 y.o. Male) Treating RN: [name] Primary Care Embry Huss: [name] Other Clinician: Referring [name]: [name] Treating [name]/Extender: [name] in Treatment: 0 Clinic Level of Care Assessment Items TOOL 4 Quantity Score X - Use when only an EandM is performed on FOLLOW-UP visit 1 0 ASSESSMENTS - Nursing Assessment / Reassessment X - Reassessment of Co-morbidities (includes updates in patient status) 1 10 X - Reassessment of Adherence to Treatment Plan 1 5 ASSESSMENTS - Wound and Skin Assessment / Reassessment X - Simple Wound Assessment / Reassessment - one wound 1 5 []  - Complex Wound Assessment / Reassessment - multiple wounds 0 []  - Dermatologic / Skin Assessment (not related to wound area) 0 ASSESSMENTS - Focused Assessment []  - Circumferential Edema Measurements - multi extremities 0 []  - Nutritional Assessment / Counseling / Intervention 0 []  - Lower Extremity Assessment (monofilament, tuning fork, pulses) 0 []  - Peripheral Arterial Disease Assessment (using hand held doppler) 0 ASSESSMENTS - Ostomy and/or Continence Assessment and Care []  - Incontinence Assessment and Management 0 []  - Ostomy Care Assessment and Management (repouching, etc.) 0 PROCESS - Coordination of Care []  - Simple Patient / Family Education for ongoing care 0 X - Complex (extensive) Patient / Family Education for ongoing care 1 20 X - Staff obtains Chiropractor, Records, Test Results / Process Orders 1 10 []  - Staff telephones HHA, Nursing Homes / Clarify orders / etc 0 []  - Routine Transfer to another Facility (non-emergent condition) 0 [name], [name] ([id number]) []  - Routine Hospital Admission (non-emergent condition) 0 []  - New Admissions / Manufacturing engineer / Ordering NPWT, Apligraf, etc. 0 []  - Emergency Hospital Admission (emergent condition) 0 X - Simple Discharge Coordination 1 10 []  - Complex  (extensive) Discharge Coordination 0 PROCESS - Special Needs []  - Pediatric / Minor Patient Management 0 []  - Isolation Patient Management 0 []  - Hearing / Language / Visual special needs 0 []  - Assessment of Community assistance (transportation, D/C planning, etc.) 0 []  - Additional assistance / Altered mentation 0 []  - Support Surface(s) Assessment (bed, cushion, seat, etc.) 0 INTERVENTIONS - Wound Cleansing / Measurement X - Simple Wound  Cleansing - one wound 1 5 []  - Complex Wound Cleansing - multiple wounds 0 X - Wound Imaging (photographs - any number of wounds) 1 5 []  - Wound Tracing (instead of photographs) 0 X - Simple Wound Measurement - one wound 1 5 []  - Complex Wound Measurement - multiple wounds 0 INTERVENTIONS - Wound Dressings X - Small Wound Dressing one or multiple wounds 1 10 []  - Medium Wound Dressing one or multiple wounds 0 []  - Large Wound Dressing one or multiple wounds 0 X - Application of Medications - topical 1 5 []  - Application of Medications - injection 0 INTERVENTIONS - Miscellaneous []  - External ear exam 0 [name], [name] ([id number]) []  - Specimen Collection (cultures, biopsies, blood, body fluids, etc.) 0 []  - Specimen(s) / Culture(s) sent or taken to Lab for analysis 0 []  - Patient Transfer (multiple staff / Michiel Sites Lift / Similar devices) 0 []  - Simple Staple / Suture removal (25 or less) 0 []  - Complex Staple / Suture removal (26 or more) 0 []  - Hypo / Hyperglycemic Management (close monitor of Blood Glucose) 0 []  - Ankle / Brachial Index (ABI) - do not check if billed separately 0 X - Vital Signs 1 5 Has the patient been seen at the hospital within the last three years: Yes Total Score: 95 Level Of Care: New/Established - Level 3 Electronic Signature(s) Signed: 10/28/2016 4:07:04 PM By: [name] Entered By: [name] on 10/28/2016 11:32:15 [name], [name]  ([id number]) -------------------------------------------------------------------------------- Encounter Discharge Information Details Patient Name: [name] Date of Service: 10/28/2016 10:30 AM Medical Record Number: [id number] Patient Account Number: [id number] Date of Birth/Sex: 17-Jul-1926 (83 y.o. Male) Treating RN: [name] Primary Care Zeriah Baysinger: [name] Other Clinician: Referring Creig Landin: [name] Treating Crissy Mccreadie/Extender: [name] in Treatment: 0 Encounter Discharge Information Items Discharge Pain Level: 0 Discharge Condition: Stable Ambulatory Status: Wheelchair Discharge Destination: Home Transportation: Private Auto daughter and Accompanied By: granddaughter Schedule Follow-up Appointment: Yes Medication Reconciliation completed and provided to No Patient/Care Jarrah Babich: Provided on Clinical Summary of Care: 10/28/2016 Form Type Recipient Paper Patient CR Electronic Signature(s) Signed: 10/28/2016 4:07:04 PM By: [name] Entered By: [name] on 10/28/2016 11:20:22 [name], [name] ([id number]) -------------------------------------------------------------------------------- Lower Extremity Assessment Details Patient Name: [name] Date of Service: 10/28/2016 10:30 AM Medical Record Number: [id number] Patient Account Number: [id number] Date of Birth/Sex: [birth year] (83 y.o. Male) Treating RN: [name] Primary Care Aryah Doering: [name] Other Clinician: Referring Hibah Odonnell: [name] Treating Kevork Joyce/Extender: [name] in Treatment: 0 Electronic Signature(s) Signed: 10/28/2016 4:07:04 PM By: [name] Entered By: [name] on 10/28/2016 10:42:05 [name], [name] ([id number]) -------------------------------------------------------------------------------- Multi Wound Chart Details Patient Name: [name] Date of Service:  10/28/2016 10:30 AM Medical Record Number: [id number] Patient Account Number: [id number] Date of Birth/Sex: 1926-07-14 (83 y.o. Male) Treating RN: [name] Primary Care Eligio Angert: [name] Other Clinician: Referring Millan Legan: [name] Treating Tajuanna Burnett/Extender: [name] in Treatment: 0 Vital Signs Height(in): 69 Pulse(bpm): 62 Weight(lbs): 125 Blood Pressure 126/77 (mmHg): Body Mass Index(BMI): 18 Temperature(F): Respiratory Rate 16 (breaths/min): Photos: [1:No Photos] [N/A:N/A] Wound Location: [1:Left Elbow] [N/A:N/A] Wounding Event: [1:Trauma] [N/A:N/A] Primary Etiology: [1:Trauma, Other] [N/A:N/A] Comorbid History: [1:Hypertension, Type II Diabetes, Osteoarthritis, Neuropathy] [N/A:N/A] Date Acquired: [1:10/21/2016] [N/A:N/A] Weeks of Treatment: [1:0] [N/A:N/A] Wound Status: [1:Open] [N/A:N/A] Measurements L x W x D 7.8x3.9x0.1 [N/A:N/A] (cm) Area (cm) : [1:23.892] [N/A:N/A] Volume (cm) : [1:2.389] [N/A:N/A] Classification: [1:Partial Thickness] [N/A:N/A] Exudate Amount: [1:Large] [N/A:N/A] Exudate Type: [  1:Serosanguineous] [N/A:N/A] Exudate Color: [1:red, brown] [N/A:N/A] Wound Margin: [1:Distinct, outline attached] [N/A:N/A] Granulation Amount: [1:Small (1-33%)] [N/A:N/A] Granulation Quality: [1:Red, Pink] [N/A:N/A] Necrotic Amount: [1:Large (67-100%)] [N/A:N/A] Epithelialization: [1:None] [N/A:N/A] Debridement: [1:Debridement (22025- 11047)] [N/A:N/A] Pre-procedure [1:11:01] [N/A:N/A] Verification/Time Out Taken: Pain Control: [1:Lidocaine 4% Topical Solution] [N/A:N/A] Tissue Debrided: Fibrin/Slough, Exudates, N/A N/A Subcutaneous Level: Skin/Subcutaneous N/A N/A Tissue Debridement Area (sq 9 N/A N/A cm): Instrument: Forceps, Scissors N/A N/A Bleeding: Minimum N/A N/A Hemostasis Achieved: Pressure N/A N/A Procedural Pain: 0 N/A N/A Post Procedural Pain: 0 N/A N/A Debridement Treatment Procedure was tolerated N/A  N/A Response: well Post Debridement 7.8x3.9x0.1 N/A N/A Measurements L x W x D (cm) Post Debridement 2.389 N/A N/A Volume: (cm) Periwound Skin Texture: No Abnormalities Noted N/A N/A Periwound Skin Maceration: Yes N/A N/A Moisture: Periwound Skin Color: Erythema: Yes N/A N/A Erythema Location: Circumferential N/A N/A Temperature: No Abnormality N/A N/A Tenderness on Yes N/A N/A Palpation: Wound Preparation: Ulcer Cleansing: N/A N/A Rinsed/Irrigated with Saline Topical Anesthetic Applied: Other: lidocaine 4% Procedures Performed: Debridement N/A N/A Treatment Notes Electronic Signature(s) Signed: 10/28/2016 11:54:07 AM By: Evlyn Kanner MD, FACS Entered By: Evlyn Kanner on 10/28/2016 11:12:20 [name], [name] ([id number]) -------------------------------------------------------------------------------- Multi-Disciplinary Care Plan Details Patient Name: [name] Date of Service: 10/28/2016 10:30 AM Medical Record Number: [id number] Patient Account Number: [id number] Date of Birth/Sex: 03/01/1927 (83 y.o. Male) Treating RN: [name] Primary Care Eloisa Chokshi: [name] Other Clinician: Referring Misha Vanoverbeke: [name] Treating Shaheem Pichon/Extender: [name] in Treatment: 0 Active Inactive ` Abuse / Safety / Falls / Self Care Management Nursing Diagnoses: History of Falls Potential for falls Goals: Patient will not experience any injury related to falls Date Initiated: 10/28/2016 Target Resolution Date: 02/11/2017 Goal Status: Active Interventions: Assess Activities of Daily Living upon admission and as needed Assess fall risk on admission and as needed Notes: ` Nutrition Nursing Diagnoses: Imbalanced nutrition Impaired glucose control: actual or potential Potential for alteratiion in Nutrition/Potential for imbalanced nutrition Goals: Patient/caregiver agrees to and verbalizes understanding of need to use nutritional  supplements and/or vitamins as prescribed Date Initiated: 10/28/2016 Target Resolution Date: 01/14/2017 Goal Status: Active Patient/caregiver will maintain therapeutic glucose control Date Initiated: 10/28/2016 Target Resolution Date: 02/11/2017 Goal Status: Active Interventions: Assess patient nutrition upon admission and as needed per policy [name], [name] ([id number]) Notes: ` Orientation to the Wound Care Program Nursing Diagnoses: Knowledge deficit related to the wound healing center program Goals: Patient/caregiver will verbalize understanding of the Wound Healing Center Program Date Initiated: 10/28/2016 Target Resolution Date: 11/12/2016 Goal Status: Active Interventions: Provide education on orientation to the wound center Notes: ` Pain, Acute or Chronic Nursing Diagnoses: Pain, acute or chronic: actual or potential Potential alteration in comfort, pain Goals: Patient/caregiver will verbalize adequate pain control between visits Date Initiated: 10/28/2016 Target Resolution Date: 02/11/2017 Goal Status: Active Interventions: Complete pain assessment as per visit requirements Notes: ` Wound/Skin Impairment Nursing Diagnoses: Impaired tissue integrity Knowledge deficit related to ulceration/compromised skin integrity Goals: Ulcer/skin breakdown will have a volume reduction of 80% by week 12 Date Initiated: 10/28/2016 Target Resolution Date: 02/04/2017 Goal Status: Active [name], [name] ([id number]) Interventions: Assess patient/caregiver ability to perform ulcer/skin care regimen upon admission and as needed Notes: Electronic Signature(s) Signed: 10/28/2016 4:07:04 PM By: [name] Entered By: [name] on 10/28/2016 11:11:20 [name] ([id number]) -------------------------------------------------------------------------------- Pain Assessment Details Patient Name: [name] Date of Service: 10/28/2016 10:30  AM Medical Record Number: [id number] Patient Account Number: [id number] Date of Birth/Sex: [birth year] (83  y.o. Male) Treating RN: Ashok Cordia, Debi Primary Care Ramyah Pankowski: [name] Other Clinician: Referring Chele Cornell: [name] Treating Apolonio Cutting/Extender: [name] in Treatment: 0 Active Problems Location of Pain Severity and Description of Pain Patient Has Paino No Site Locations Pain Management and Medication Current Pain Management: Electronic Signature(s) Signed: 10/28/2016 4:07:04 PM By: [name] Entered By: [name] on 10/28/2016 10:26:39 [name] ([id number]) -------------------------------------------------------------------------------- Patient/Caregiver Education Details Patient Name: [name] Date of Service: 10/28/2016 10:30 AM Medical Record Number: [id number] Patient Account Number: [id number] Date of Birth/Gender: 09-24-1926 (83 y.o. Male) Treating RN: [name] Primary Care Physician: [name] Other Clinician: Referring Physician: Merrily [name] Treating Physician/Extender: [name] in Treatment: 0 Education Assessment Education Provided To: Patient and Caregiver daughter Education Topics Provided Welcome To [facility]: Handouts: Welcome To [facility] Methods: Explain/Verbal Responses: State content correctly Wound/Skin Impairment: Handouts: Other: change dressing as ordered Methods: Demonstration, Explain/Verbal Responses: State content correctly Electronic Signature(s) Signed: 10/28/2016 4:07:04 PM By: [name] Entered By: [name] on 10/28/2016 11:20:39 Volkman, [name] ([id number]) -------------------------------------------------------------------------------- Wound Assessment Details Patient Name: [name] Date of Service: 10/28/2016 10:30 AM Medical Record Number: [id number] Patient Account Number:  [id number] Date of Birth/Sex: [birth year] (83 y.o. Male) Treating RN: [name] Primary Care Briseidy Spark: [name] Other Clinician: Referring Kiva Norland: [name] Treating Estefani Bateson/Extender: [name] in Treatment: 0 Wound Status Wound Number: 1 Primary Trauma, Other Etiology: Wound Location: Left Elbow Wound Open Wounding Event: Trauma Status: Date Acquired: 10/21/2016 Comorbid Hypertension, Type II Diabetes, Weeks Of Treatment: 0 History: Osteoarthritis, Neuropathy Clustered Wound: No Photos Photo Uploaded By: [name] on 10/28/2016 16:05:37 Wound Measurements Length: (cm) 7.8 Width: (cm) 3.9 Depth: (cm) 0.1 Area: (cm) 23.892 Volume: (cm) 2.389 % Reduction in Area: % Reduction in Volume: Epithelialization: None Tunneling: No Undermining: No Wound Description Classification: Partial Thickness Foul Odor Aft Wound Margin: Distinct, outline attached Slough/Fibrin Exudate Amount: Large Exudate Type: Serosanguineous Exudate Color: red, brown er Cleansing: No o Yes Wound Bed Granulation Amount: Small (1-33%) Granulation Quality: Red, Pink Necrotic Amount: Large (67-100%) Necrotic Quality: Adherent Slough [name], [name] ([id number]) Periwound Skin Texture Texture Color No Abnormalities Noted: No No Abnormalities Noted: No Erythema: Yes Moisture Erythema Location: Circumferential No Abnormalities Noted: No Maceration: Yes Temperature / Pain Temperature: No Abnormality Tenderness on Palpation: Yes Wound Preparation Ulcer Cleansing: Rinsed/Irrigated with Saline Topical Anesthetic Applied: Other: lidocaine 4%, Treatment Notes Wound #1 (Left Elbow) 1. Cleansed with: Clean wound with Normal Saline 2. Anesthetic Topical Lidocaine 4% cream to wound bed prior to debridement 4. Dressing Applied: Aquacel Ag 5. Secondary Dressing Applied ABD Pad Kerlix/Conform 7. Secured with Tape Notes stretch netting #3 Electronic  Signature(s) Signed: 10/28/2016 4:07:04 PM By: [name] Entered By: [name] on 10/28/2016 10:52:30 [name], BERENGUER ([id number]) -------------------------------------------------------------------------------- Vitals Details Patient Name: [name] Date of Service: 10/28/2016 10:30 AM Medical Record Number: [id number] Patient Account Number: [id number] Date of Birth/Sex: March 05, 1927 (83 y.o. Male) Treating RN: [name] Primary Care Colbi Schiltz: [name] Other Clinician: Referring Floreine Kingdon: [name] Treating Lanson Randle/Extender: [name] in Treatment: 0 Vital Signs Time Taken: 10:26 Pulse (bpm): 62 Height (in): 69 Respiratory Rate (breaths/min): 16 Source: Stated Blood Pressure (mmHg): 126/77 Weight (lbs): 125 Reference Range: 80 - 120 mg / dl Source: Measured Body Mass Index (BMI): 18.5 Electronic Signature(s) Signed: 10/28/2016 4:07:04 PM By: [name] Entered By: [name] on 10/28/2016 10:29:43

## 2016-11-03 ENCOUNTER — Encounter: Payer: Medicare Other | Admitting: Surgery

## 2016-11-03 DIAGNOSIS — E11622 Type 2 diabetes mellitus with other skin ulcer: Secondary | ICD-10-CM | POA: Diagnosis not present

## 2016-11-04 ENCOUNTER — Ambulatory Visit: Payer: Medicare Other | Admitting: Surgery

## 2016-11-05 NOTE — Progress Notes (Signed)
[name], Favian J. ([id number]) Visit Report for 11/03/2016 Chief Complaint Document Details Patient Name: [name], [name] Date of Service: 11/03/2016 9:15 AM Medical Record Number: [id number] Patient Account Number: [id number] Date of Birth/Sex: 06/16/1926 (83 y.o. Male) Treating RN: Huel CoventryWoody, Kim Primary Care Provider: Einar CrowAnderson, Marshall Other Clinician: Referring Provider: Einar CrowAnderson, Marshall Treating Provider/Extender: Rudene ReBritto, Jemiah Ellen[place] Weeks in Treatment: 0 Information Obtained from: Patient Chief Complaint Patient seen for complaints of Non-Healing Wound to the left elbow region and lateral forearm which she's had for a week Electronic Signature(s) Signed: 11/03/2016 4:28:32 PM By: Evlyn KannerBritto, Victoriah Wilds MD, FACS Entered By: Evlyn KannerBritto, Elide Stalzer on 11/03/2016 09:37:09 [name], Malak J. ([id number]) -------------------------------------------------------------------------------- HPI Details Patient Name: [name], [name] Date of Service: 11/03/2016 9:15 AM Medical Record Number: [id number] Patient Account Number: [id number] Date of Birth/Sex: 07/08/1926 (83 y.o. Male) Treating RN: Huel CoventryWoody, Kim Primary Care Provider: Einar CrowAnderson, Marshall Other Clinician: Referring Provider: Einar CrowAnderson, Marshall Treating Provider/Extender: Rudene ReBritto, Jennett Tarbell Weeks in Treatment: 0 History of Present Illness Location: left lateral forearm and elbow Quality: Patient reports experiencing a sharp pain to affected area(s). Severity: Patient states wound are getting worse. Duration: Patient has had the wound for < 2 weeks prior to presenting for treatment Timing: Pain in wound is Intermittent (comes and goes Context: The wound occurred when the patient had a syncopal attack and fall Modifying Factors: Other treatment(s) tried include:went to the ER for Steri-Strips and Dermabond Associated Signs and Symptoms: Patient reports having increase discharge. HPI Description: 83 year old patient had a syncopal attack and a fall on  10/21/2016 and had a injury to his left elbow which was treated in the ER after appropriate x-rays were reviewed. The wound was closed with Steri-Strips and Dermabond and was asked to follow-up at the wound center. past medical history significant for type 2 diabetes mellitus, hyperlipidemia, benign hypertension, stage II chronic kidney disease, gout, prostatic hypertrophy, status post EGD and colonoscope he and right hip fracture repair. he has never been a smoker. Lab work done on 09/20/2016 was reviewed and his hemoglobin A1c was 6.3% 11/03/2016 -- he has been very compliant with his dressing changes and is gotten them done by his granddaughter who has taken excellent care of him Electronic Signature(s) Signed: 11/03/2016 4:28:32 PM By: Evlyn KannerBritto, Ludivina Guymon MD, FACS Entered By: Evlyn KannerBritto, Jeanpierre Thebeau on 11/03/2016 09:37:41 [name], [name] ([id number]) -------------------------------------------------------------------------------- Physical Exam Details Patient Name: [name], [name] Date of Service: 11/03/2016 9:15 AM Medical Record Number: [id number] Patient Account Number: [id number] Date of Birth/Sex: 10/19/1926 50(83 y.o. Male) Treating RN: Huel CoventryWoody, Kim Primary Care Provider: Einar CrowAnderson, Marshall Other Clinician: Referring Provider: Einar CrowAnderson, Marshall Treating Provider/Extender: Rudene ReBritto, Aedon Deason Weeks in Treatment: 0 Constitutional . Pulse regular. Respirations normal and unlabored. Afebrile. . Eyes Nonicteric. Reactive to light. Ears, Nose, Mouth, and Throat Lips, teeth, and gums WNL.Marland Kitchen. Moist mucosa without lesions. Neck supple and nontender. No palpable supraclavicular or cervical adenopathy. Normal sized without goiter. Respiratory WNL. No retractions.. Cardiovascular Pedal Pulses WNL. No clubbing, cyanosis or edema. Lymphatic No adneopathy. No adenopathy. No adenopathy. Musculoskeletal Adexa without tenderness or enlargement.. Digits and nails w/o clubbing, cyanosis, infection,  petechiae, ischemia, or inflammatory conditions.. Integumentary (Hair, Skin) No suspicious lesions. No crepitus or fluctuance. No peri-wound warmth or erythema. No masses.Marland Kitchen. Psychiatric Judgement and insight Intact.. No evidence of depression, anxiety, or agitation.. Notes the wound is looking very good with a few open areas and no sharp debridement was required today. Electronic Signature(s) Signed: 11/03/2016 4:28:32 PM By: Evlyn KannerBritto, Hasson Gaspard MD, FACS Entered By: Evlyn KannerBritto, Deontrae Drinkard on 11/03/2016 09:38:05 Geiselman, Calob Shela CommonsJ. (  [id number]) -------------------------------------------------------------------------------- Physician Orders Details Patient Name: OGDEN, HANDLIN Date of Service: 11/03/2016 9:15 AM Medical Record Number: [id number] Patient Account Number: [id number] Date of Birth/Sex: 06/03/1926 (83 y.o. Male) Treating RN: Huel Coventry Primary Care Provider: Einar Crow Other Clinician: Referring Provider: Einar Crow Treating Provider/Extender: Rudene Re in Treatment: 0 Verbal / Phone Orders: No Diagnosis Coding ICD-10 Coding Code Description E11.622 Type 2 diabetes mellitus with other skin ulcer S51.012A Laceration without foreign body of left elbow, initial encounter S51.002A Unspecified open wound of left elbow, initial encounter Wound Cleansing Wound #1 Left Elbow o Clean wound with Normal Saline. o Cleanse wound with mild soap and water o May Shower, gently pat wound dry prior to applying new dressing. Primary Wound Dressing Wound #1 Left Elbow o Other: - telfa Secondary Dressing Wound #1 Left Elbow o ABD pad o Conform/Kerlix o Other - stretch netting #4 Dressing Change Frequency Wound #1 Left Elbow o Change dressing every other day. Follow-up Appointments Wound #1 Left Elbow o Return Appointment in 1 week. Additional Orders / Instructions Wound #1 Left Elbow o Increase protein intake. MYLIK, PRO  ([id number]) Electronic Signature(s) Signed: 11/03/2016 9:52:54 AM By: Elliot Gurney, BSN, RN, CWS, Kim RN, BSN Signed: 11/03/2016 4:28:32 PM By: Evlyn Kanner MD, FACS Entered By: Elliot Gurney, BSN, RN, CWS, Kim on 11/03/2016 09:39:45 BRACEN, SCHUM ([id number]) -------------------------------------------------------------------------------- Problem List Details Patient Name: [name] Date of Service: 11/03/2016 9:15 AM Medical Record Number: [id number] Patient Account Number: [id number] Date of Birth/Sex: 09-16-1926 (83 y.o. Male) Treating RN: Huel Coventry Primary Care Provider: Einar Crow Other Clinician: Referring Provider: Einar Crow Treating Provider/Extender: Rudene Re in Treatment: 0 Active Problems ICD-10 Encounter Code Description Active Date Diagnosis E11.622 Type 2 diabetes mellitus with other skin ulcer 10/28/2016 Yes S51.012A Laceration without foreign body of left elbow, initial 10/28/2016 Yes encounter S51.002A Unspecified open wound of left elbow, initial encounter 10/28/2016 Yes Inactive Problems Resolved Problems Electronic Signature(s) Signed: 11/03/2016 4:28:32 PM By: Evlyn Kanner MD, FACS Entered By: Evlyn Kanner on 11/03/2016 09:36:54 [name] ([id number]) -------------------------------------------------------------------------------- Progress Note Details Patient Name: [name] Date of Service: 11/03/2016 9:15 AM Medical Record Number: [id number] Patient Account Number: [id number] Date of Birth/Sex: Apr 09, 1926 (83 y.o. Male) Treating RN: Huel Coventry Primary Care Provider: Einar Crow Other Clinician: Referring Provider: Einar Crow Treating Provider/Extender: Rudene Re in Treatment: 0 Subjective Chief Complaint Information obtained from Patient Patient seen for complaints of Non-Healing Wound to the left elbow region and lateral forearm which she's had for a week History of  Present Illness (HPI) The following HPI elements were documented for the patient's wound: Location: left lateral forearm and elbow Quality: Patient reports experiencing a sharp pain to affected area(s). Severity: Patient states wound are getting worse. Duration: Patient has had the wound for < 2 weeks prior to presenting for treatment Timing: Pain in wound is Intermittent (comes and goes Context: The wound occurred when the patient had a syncopal attack and fall Modifying Factors: Other treatment(s) tried include:went to the ER for Steri-Strips and Dermabond Associated Signs and Symptoms: Patient reports having increase discharge. 83 year old patient had a syncopal attack and a fall on 10/21/2016 and had a injury to his left elbow which was treated in the ER after appropriate x-rays were reviewed. The wound was closed with Steri-Strips and Dermabond and was asked to follow-up at the wound center. past medical history significant for type 2 diabetes mellitus, hyperlipidemia, benign hypertension, stage II chronic kidney disease, gout, prostatic hypertrophy,  status post EGD and colonoscope he and right hip fracture repair. he has never been a smoker. Lab work done on 09/20/2016 was reviewed and his hemoglobin A1c was 6.3% 11/03/2016 -- he has been very compliant with his dressing changes and is gotten them done by his granddaughter who has taken excellent care of him Objective Constitutional Mcbeth, Grantham J. ([id number]) Pulse regular. Respirations normal and unlabored. Afebrile. Vitals Time Taken: 9:20 AM, Height: 69 in, Weight: 125 lbs, BMI: 18.5, Pulse: 62 bpm, Respiratory Rate: 16 breaths/min, Blood Pressure: 128/63 mmHg. Eyes Nonicteric. Reactive to light. Ears, Nose, Mouth, and Throat Lips, teeth, and gums WNL.Marland Kitchen Moist mucosa without lesions. Neck supple and nontender. No palpable supraclavicular or cervical adenopathy. Normal sized without goiter. Respiratory WNL. No  retractions.. Cardiovascular Pedal Pulses WNL. No clubbing, cyanosis or edema. Lymphatic No adneopathy. No adenopathy. No adenopathy. Musculoskeletal Adexa without tenderness or enlargement.. Digits and nails w/o clubbing, cyanosis, infection, petechiae, ischemia, or inflammatory conditions.Marland Kitchen Psychiatric Judgement and insight Intact.. No evidence of depression, anxiety, or agitation.. General Notes: the wound is looking very good with a few open areas and no sharp debridement was required today. Integumentary (Hair, Skin) No suspicious lesions. No crepitus or fluctuance. No peri-wound warmth or erythema. No masses.. Wound #1 status is Open. Original cause of wound was Trauma. The wound is located on the Left Elbow. The wound measures 7.5cm length x 1cm width x 0.1cm depth; 5.89cm^2 area and 0.589cm^3 volume. There is Fat Layer (Subcutaneous Tissue) Exposed exposed. There is no tunneling or undermining noted. There is a large amount of serosanguineous drainage noted. The wound margin is distinct with the outline attached to the wound base. There is small (1-33%) red, pink granulation within the wound bed. There is a small (1-33%) amount of necrotic tissue within the wound bed including Eschar and Adherent Slough. The periwound skin appearance exhibited: Scarring. The periwound skin appearance did not exhibit: Callus, Crepitus, Excoriation, Induration, Rash, Dry/Scaly, Maceration, Atrophie Blanche, Cyanosis, Ecchymosis, Hemosiderin Staining, Mottled, Pallor, Rubor, Erythema. Periwound temperature was noted as No Abnormality. The periwound has tenderness on palpation. NAVID, [name] ([id number]) Assessment Active Problems ICD-10 E11.622 - Type 2 diabetes mellitus with other skin ulcer S51.012A - Laceration without foreign body of left elbow, initial encounter S51.002A - Unspecified open wound of left elbow, initial encounter Plan Wound Cleansing: Wound #1 Left Elbow: Clean wound  with Normal Saline. Cleanse wound with mild soap and water May Shower, gently pat wound dry prior to applying new dressing. Primary Wound Dressing: Wound #1 Left Elbow: Other: - telfa Secondary Dressing: Wound #1 Left Elbow: ABD pad Conform/Kerlix Other - stretch netting #4 Dressing Change Frequency: Wound #1 Left Elbow: Change dressing every other day. Follow-up Appointments: Wound #1 Left Elbow: Return Appointment in 1 week. Additional Orders / Instructions: Wound #1 Left Elbow: Increase protein intake. after review today, I have recommended: 1. Telfa, gauze and a Kerlix to be applied every other day Whan, Kamareon J. ([id number]) 2. May wash with soap and water 3. Continue good care of his diet-controlled diabetes mellitus 4. Regular visits the wound center Electronic Signature(s) Signed: 11/03/2016 4:28:32 PM By: Evlyn Kanner MD, FACS Entered By: Evlyn Kanner on 11/03/2016 15:53:05 ROCKNE, DEARINGER ([id number]) -------------------------------------------------------------------------------- SuperBill Details Patient Name: [name] Date of Service: 11/03/2016 Medical Record Number: [id number] Patient Account Number: [id number] Date of Birth/Sex: 1926-03-11 (83 y.o. Male) Treating RN: Huel Coventry Primary Care Provider: Einar Crow Other Clinician: Referring Provider: Einar Crow Treating Provider/Extender: Rudene Re in  Treatment: 0 Diagnosis Coding ICD-10 Codes Code Description E11.622 Type 2 diabetes mellitus with other skin ulcer S51.012A Laceration without foreign body of left elbow, initial encounter S51.002A Unspecified open wound of left elbow, initial encounter Facility Procedures CPT4 Code: 16109604 Description: [id number] - WOUND CARE VISIT-LEV 2 EST PT Modifier: Quantity: 1 Physician Procedures CPT4 Code: [id number] Description: 99213 - WC PHYS LEVEL 3 - EST PT ICD-10 Description Diagnosis E11.622 Type 2 diabetes  mellitus with other skin ulcer S51.012A Laceration without foreign body of left elbow, ini S51.002A Unspecified open wound of left elbow, initial enco Modifier: tial encounter unter Quantity: 1 Electronic Signature(s) Signed: 11/03/2016 9:52:54 AM By: Elliot Gurney, BSN, RN, CWS, Kim RN, BSN Signed: 11/03/2016 4:28:32 PM By: Evlyn Kanner MD, FACS Entered By: Elliot Gurney, BSN, RN, CWS, Kim on 11/03/2016 09:40:24

## 2016-11-05 NOTE — Progress Notes (Signed)
KILLIAN, SCHWER ([id number]) Visit Report for 11/03/2016 Arrival Information Details Patient Name: [name], [name] Date of Service: 11/03/2016 9:15 AM Medical Record Number: [id number] Patient Account Number: [id number] Date of Birth/Sex: 05-05-1926 (83 y.o. Male) Treating RN: Huel Coventry Primary Care Ivaan Liddy: Einar Crow Other Clinician: Referring Shatera Rennert: Einar Crow Treating Tyler Robidoux/Extender: Rudene Re in Treatment: 0 Visit Information History Since Last Visit Added or deleted any medications: No Patient Arrived: Wheel Chair Any new allergies or adverse reactions: No Arrival Time: 09:20 Had a fall or experienced change in No Accompanied By: stepdaughter, activities of daily living that may affect barbara risk of falls: Transfer Assistance: None Signs or symptoms of abuse/neglect since last No Patient Identification Verified: Yes visito Secondary Verification Process Yes Hospitalized since last visit: No Completed: Has Dressing in Place as Prescribed: Yes Patient Requires Transmission- No Pain Present Now: No Based Precautions: Patient Has Alerts: Yes Patient Alerts: DM II Electronic Signature(s) Signed: 11/03/2016 9:52:54 AM By: Elliot Gurney, BSN, RN, CWS, Kim RN, BSN Entered By: Elliot Gurney, BSN, RN, CWS, Kim on 11/03/2016 09:20:43 [name] ([id number]) -------------------------------------------------------------------------------- Clinic Level of Care Assessment Details Patient Name: [name] Date of Service: 11/03/2016 9:15 AM Medical Record Number: [id number] Patient Account Number: [id number] Date of Birth/Sex: [birth year] (83 y.o. Male) Treating RN: Huel Coventry Primary Care Dakotah Orrego: Einar Crow Other Clinician: Referring Gordan Grell: Einar Crow Treating Lanny Donoso/Extender: Rudene Re in Treatment: 0 Clinic Level of Care Assessment Items TOOL 4 Quantity Score []  - Use when only an EandM is  performed on FOLLOW-UP visit 0 ASSESSMENTS - Nursing Assessment / Reassessment []  - Reassessment of Co-morbidities (includes updates in patient status) 0 X - Reassessment of Adherence to Treatment Plan 1 5 ASSESSMENTS - Wound and Skin Assessment / Reassessment X - Simple Wound Assessment / Reassessment - one wound 1 5 []  - Complex Wound Assessment / Reassessment - multiple wounds 0 []  - Dermatologic / Skin Assessment (not related to wound area) 0 ASSESSMENTS - Focused Assessment []  - Circumferential Edema Measurements - multi extremities 0 []  - Nutritional Assessment / Counseling / Intervention 0 []  - Lower Extremity Assessment (monofilament, tuning fork, pulses) 0 []  - Peripheral Arterial Disease Assessment (using hand held doppler) 0 ASSESSMENTS - Ostomy and/or Continence Assessment and Care []  - Incontinence Assessment and Management 0 []  - Ostomy Care Assessment and Management (repouching, etc.) 0 PROCESS - Coordination of Care X - Simple Patient / Family Education for ongoing care 1 15 []  - Complex (extensive) Patient / Family Education for ongoing care 0 []  - Staff obtains Chiropractor, Records, Test Results / Process Orders 0 []  - Staff telephones HHA, Nursing Homes / Clarify orders / etc 0 []  - Routine Transfer to another Facility (non-emergent condition) 0 [name], [name] ([id number]) []  - Routine Hospital Admission (non-emergent condition) 0 []  - New Admissions / Manufacturing engineer / Ordering NPWT, Apligraf, etc. 0 []  - Emergency Hospital Admission (emergent condition) 0 X - Simple Discharge Coordination 1 10 []  - Complex (extensive) Discharge Coordination 0 PROCESS - Special Needs []  - Pediatric / Minor Patient Management 0 []  - Isolation Patient Management 0 []  - Hearing / Language / Visual special needs 0 []  - Assessment of Community assistance (transportation, D/C planning, etc.) 0 []  - Additional assistance / Altered mentation 0 []  - Support Surface(s)  Assessment (bed, cushion, seat, etc.) 0 INTERVENTIONS - Wound Cleansing / Measurement X - Simple Wound Cleansing - one wound 1 5 []  - Complex Wound Cleansing - multiple wounds 0 X -  Wound Imaging (photographs - any number of wounds) 1 5 []  - Wound Tracing (instead of photographs) 0 X - Simple Wound Measurement - one wound 1 5 []  - Complex Wound Measurement - multiple wounds 0 INTERVENTIONS - Wound Dressings []  - Small Wound Dressing one or multiple wounds 0 X - Medium Wound Dressing one or multiple wounds 1 15 []  - Large Wound Dressing one or multiple wounds 0 []  - Application of Medications - topical 0 []  - Application of Medications - injection 0 INTERVENTIONS - Miscellaneous []  - External ear exam 0 [name], [name] ([id number]) []  - Specimen Collection (cultures, biopsies, blood, body fluids, etc.) 0 []  - Specimen(s) / Culture(s) sent or taken to Lab for analysis 0 []  - Patient Transfer (multiple staff / Michiel Sites Lift / Similar devices) 0 []  - Simple Staple / Suture removal (25 or less) 0 []  - Complex Staple / Suture removal (26 or more) 0 []  - Hypo / Hyperglycemic Management (close monitor of Blood Glucose) 0 []  - Ankle / Brachial Index (ABI) - do not check if billed separately 0 X - Vital Signs 1 5 Has the patient been seen at the hospital within the last three years: Yes Total Score: 70 Level Of Care: New/Established - Level 2 Electronic Signature(s) Signed: 11/03/2016 9:52:54 AM By: Elliot Gurney, BSN, RN, CWS, Kim RN, BSN Entered By: Elliot Gurney, BSN, RN, CWS, Kim on 11/03/2016 09:40:15 [name] ([id number]) -------------------------------------------------------------------------------- Encounter Discharge Information Details Patient Name: [name] Date of Service: 11/03/2016 9:15 AM Medical Record Number: [id number] Patient Account Number: [id number] Date of Birth/Sex: 03/15/1926 (83 y.o. Male) Treating RN: Huel Coventry Primary Care Pellegrino Kennard: Einar Crow Other Clinician: Referring Addalyne Vandehei: Einar Crow Treating Yan Pankratz/Extender: Rudene Re in Treatment: 0 Encounter Discharge Information Items Discharge Pain Level: 0 Discharge Condition: Stable Ambulatory Status: Wheelchair Discharge Destination: Home Transportation: Private Auto Accompanied By: daughter Schedule Follow-up Appointment: Yes Medication Reconciliation completed Yes and provided to Patient/Care Austin Pongratz: Patient Clinical Summary of Care: Declined Electronic Signature(s) Signed: 11/03/2016 9:52:54 AM By: Elliot Gurney, BSN, RN, CWS, Kim RN, BSN Entered By: Elliot Gurney, BSN, RN, CWS, Kim on 11/03/2016 09:49:04 [name] ([id number]) -------------------------------------------------------------------------------- Lower Extremity Assessment Details Patient Name: [name] Date of Service: 11/03/2016 9:15 AM Medical Record Number: [id number] Patient Account Number: [id number] Date of Birth/Sex: [birth year] (83 y.o. Male) Treating RN: Huel Coventry Primary Care Keishla Oyer: Einar Crow Other Clinician: Referring Tyshon Fanning: Einar Crow Treating Quinnlan Abruzzo/Extender: Rudene Re in Treatment: 0 Electronic Signature(s) Signed: 11/03/2016 9:52:54 AM By: Elliot Gurney, BSN, RN, CWS, Kim RN, BSN Entered By: Elliot Gurney, BSN, RN, CWS, Kim on 11/03/2016 09:27:48 EDEM, TIEGS ([id number]) -------------------------------------------------------------------------------- Multi Wound Chart Details Patient Name: [name] Date of Service: 11/03/2016 9:15 AM Medical Record Number: [id number] Patient Account Number: [id number] Date of Birth/Sex: 02-02-1927 (83 y.o. Male) Treating RN: Huel Coventry Primary Care Cache Bills: Einar Crow Other Clinician: Referring Inez Stantz: Einar Crow Treating Norberta Stobaugh/Extender: Rudene Re in Treatment: 0 Vital Signs Height(in): 69 Pulse(bpm): 62 Weight(lbs): 125 Blood  Pressure 128/63 (mmHg): Body Mass Index(BMI): 18 Temperature(F): Respiratory Rate 16 (breaths/min): Photos: [N/A:N/A] Wound Location: Left Elbow N/A N/A Wounding Event: Trauma N/A N/A Primary Etiology: Trauma, Other N/A N/A Comorbid History: Anemia, Hypertension, N/A N/A Type II Diabetes, Osteoarthritis, Neuropathy Date Acquired: 10/21/2016 N/A N/A Weeks of Treatment: 0 N/A N/A Wound Status: Open N/A N/A Clustered Wound: Yes N/A N/A Clustered Quantity: 4 N/A N/A Measurements L x W x D 7.5x1x0.1 N/A N/A (cm) Area (cm) : 5.89 N/A N/A  Volume (cm) : 0.589 N/A N/A % Reduction in Area: 75.30% N/A N/A % Reduction in Volume: 75.30% N/A N/A Classification: Partial Thickness N/A N/A Exudate Amount: Large N/A N/A Exudate Type: Serosanguineous N/A N/A Exudate Color: red, brown N/A N/A Wound Margin: Distinct, outline attached N/A N/A Granulation Amount: Small (1-33%) N/A N/A Granulation Quality: Red, Pink N/A N/A [name], [name] ([id number]) Necrotic Amount: Small (1-33%) N/A N/A Necrotic Tissue: Eschar, Adherent Slough N/A N/A Exposed Structures: Fat Layer (Subcutaneous N/A N/A Tissue) Exposed: Yes Epithelialization: Large (67-100%) N/A N/A Periwound Skin Texture: Scarring: Yes N/A N/A Excoriation: No Induration: No Callus: No Crepitus: No Rash: No Periwound Skin Maceration: No N/A N/A Moisture: Dry/Scaly: No Periwound Skin Color: Atrophie Blanche: No N/A N/A Cyanosis: No Ecchymosis: No Erythema: No Hemosiderin Staining: No Mottled: No Pallor: No Rubor: No Temperature: No Abnormality N/A N/A Tenderness on Yes N/A N/A Palpation: Wound Preparation: Ulcer Cleansing: N/A N/A Rinsed/Irrigated with Saline Topical Anesthetic Applied: Other: lidocaine 4% Treatment Notes Electronic Signature(s) Signed: 11/03/2016 4:28:32 PM By: Evlyn Kanner MD, FACS Entered By: Evlyn Kanner on 11/03/2016 09:36:59 [name]  ([id number]) -------------------------------------------------------------------------------- Multi-Disciplinary Care Plan Details Patient Name: [name] Date of Service: 11/03/2016 9:15 AM Medical Record Number: [id number] Patient Account Number: [id number] Date of Birth/Sex: February 03, 1927 (83 y.o. Male) Treating RN: Huel Coventry Primary Care Lacresia Darwish: Einar Crow Other Clinician: Referring Thais Silberstein: Einar Crow Treating Tammara Massing/Extender: Rudene Re in Treatment: 0 Active Inactive ` Abuse / Safety / Falls / Self Care Management Nursing Diagnoses: History of Falls Potential for falls Goals: Patient will not experience any injury related to falls Date Initiated: 10/28/2016 Target Resolution Date: 02/11/2017 Goal Status: Active Interventions: Assess Activities of Daily Living upon admission and as needed Assess fall risk on admission and as needed Notes: ` Nutrition Nursing Diagnoses: Imbalanced nutrition Impaired glucose control: actual or potential Potential for alteratiion in Nutrition/Potential for imbalanced nutrition Goals: Patient/caregiver agrees to and verbalizes understanding of need to use nutritional supplements and/or vitamins as prescribed Date Initiated: 10/28/2016 Target Resolution Date: 01/14/2017 Goal Status: Active Patient/caregiver will maintain therapeutic glucose control Date Initiated: 10/28/2016 Target Resolution Date: 02/11/2017 Goal Status: Active Interventions: Assess patient nutrition upon admission and as needed per policy [name], [name] ([id number]) Notes: ` Orientation to the Wound Care Program Nursing Diagnoses: Knowledge deficit related to the wound healing center program Goals: Patient/caregiver will verbalize understanding of the Wound Healing Center Program Date Initiated: 10/28/2016 Target Resolution Date: 11/12/2016 Goal Status: Active Interventions: Provide education on orientation to the wound  center Notes: ` Pain, Acute or Chronic Nursing Diagnoses: Pain, acute or chronic: actual or potential Potential alteration in comfort, pain Goals: Patient/caregiver will verbalize adequate pain control between visits Date Initiated: 10/28/2016 Target Resolution Date: 02/11/2017 Goal Status: Active Interventions: Complete pain assessment as per visit requirements Notes: ` Wound/Skin Impairment Nursing Diagnoses: Impaired tissue integrity Knowledge deficit related to ulceration/compromised skin integrity Goals: Ulcer/skin breakdown will have a volume reduction of 80% by week 12 Date Initiated: 10/28/2016 Target Resolution Date: 02/04/2017 Goal Status: Active [name], [name] ([id number]) Interventions: Assess patient/caregiver ability to perform ulcer/skin care regimen upon admission and as needed Notes: Electronic Signature(s) Signed: 11/03/2016 9:52:54 AM By: Elliot Gurney, BSN, RN, CWS, Kim RN, BSN Entered By: Elliot Gurney, BSN, RN, CWS, Kim on 11/03/2016 09:28:14 [name], [name] ([id number]) -------------------------------------------------------------------------------- Pain Assessment Details Patient Name: [name] Date of Service: 11/03/2016 9:15 AM Medical Record Number: [id number] Patient Account Number: [id number] Date of Birth/Sex: 01-May-1926 (83 y.o. Male) Treating RN: Elliot Gurney,  Kim Primary Care Oddie Bottger: Einar CrowAnderson, Marshall Other Clinician: Referring Jasmine Maceachern: Einar CrowAnderson, Marshall Treating Duana Benedict/Extender: Rudene ReBritto, Errol Weeks in Treatment: 0 Active Problems Location of Pain Severity and Description of Pain Patient Has Paino No Site Locations With Dressing Change: No Pain Management and Medication Current Pain Management: Goals for Pain Management Topical or injectable lidocaine is offered to patient for acute pain when surgical debridement is performed. If needed, Patient is instructed to use over the counter pain medication for the following 24-48 hours  after debridement. Wound care MDs do not prescribed pain medications. Patient has chronic pain or uncontrolled pain. Patient has been instructed to make an appointment with their Primary Care Physician for pain management. Electronic Signature(s) Signed: 11/03/2016 9:52:54 AM By: Elliot GurneyWoody, BSN, RN, CWS, Kim RN, BSN Entered By: Elliot GurneyWoody, BSN, RN, CWS, Kim on 11/03/2016 09:20:52 Christian LarocheIGGSBEE, [name] ([id number]) -------------------------------------------------------------------------------- Patient/Caregiver Education Details Patient Name: Christian LarocheIGGSBEE, [name] Date of Service: 11/03/2016 9:15 AM Medical Record Number: [id number] Patient Account Number: [id number] Date of Birth/Gender: 09/28/1926 (83 y.o. Male) Treating RN: Huel CoventryWoody, Kim Primary Care Physician: Einar CrowAnderson, Marshall Other Clinician: Referring Physician: Einar CrowAnderson, Marshall Treating Physician/Extender: Rudene ReBritto, Errol Weeks in Treatment: 0 Education Assessment Education Provided To: Patient Education Topics Provided Wound/Skin Impairment: Handouts: Caring for Your Ulcer, Other: wound care as prescribed Methods: Demonstration, Explain/Verbal Responses: State content correctly Electronic Signature(s) Signed: 11/03/2016 9:52:54 AM By: Elliot GurneyWoody, BSN, RN, CWS, Kim RN, BSN Entered By: Elliot GurneyWoody, BSN, RN, CWS, Kim on 11/03/2016 09:49:22 Christian LarocheIGGSBEE, [name] ([id number]) -------------------------------------------------------------------------------- Wound Assessment Details Patient Name: Christian LarocheIGGSBEE, Antrell J. Date of Service: 11/03/2016 9:15 AM Medical Record Number: [id number] Patient Account Number: [id number] Date of Birth/Sex: 02/23/1927 (83 y.o. Male) Treating RN: Huel CoventryWoody, Kim Primary Care Dan Dissinger: Einar CrowAnderson, Marshall Other Clinician: Referring Burdette Gergely: Einar CrowAnderson, Marshall Treating Jalacia Mattila/Extender: Rudene ReBritto, Errol Weeks in Treatment: 0 Wound Status Wound Number: 1 Primary Trauma, Other Etiology: Wound Location: Left Elbow Wound  Open Wounding Event: Trauma Status: Date Acquired: 10/21/2016 Comorbid Anemia, Hypertension, Type II Weeks Of Treatment: 0 History: Diabetes, Osteoarthritis, Neuropathy Clustered Wound: Yes Photos Wound Measurements Length: (cm) 7.5 % Reduction i Width: (cm) 1 % Reduction i Depth: (cm) 0.1 Epithelializa Clustered Quantity: 4 Tunneling: Area: (cm) 5.89 Undermining: Volume: (cm) 0.589 n Area: 75.3% n Volume: 75.3% tion: Large (67-100%) No No Wound Description Classification: Partial Thickness Foul Odor Aft Wound Margin: Distinct, outline attached Slough/Fibrin Exudate Amount: Large Exudate Type: Serosanguineous Exudate Color: red, brown er Cleansing: No o Yes Wound Bed Granulation Amount: Small (1-33%) Exposed Structure Granulation Quality: Red, Pink Fat Layer (Subcutaneous Tissue) Exposed: Yes Necrotic Amount: Small (1-33%) Necrotic Quality: Eschar, Adherent Slough Periwound Skin Texture Texture Color Tullo, Caeden J. ([id number]) No Abnormalities Noted: No No Abnormalities Noted: No Callus: No Atrophie Blanche: No Crepitus: No Cyanosis: No Excoriation: No Ecchymosis: No Induration: No Erythema: No Rash: No Hemosiderin Staining: No Scarring: Yes Mottled: No Pallor: No Moisture Rubor: No No Abnormalities Noted: No Dry / Scaly: No Temperature / Pain Maceration: No Temperature: No Abnormality Tenderness on Palpation: Yes Wound Preparation Ulcer Cleansing: Rinsed/Irrigated with Saline Topical Anesthetic Applied: Other: lidocaine 4%, Treatment Notes Wound #1 (Left Elbow) 1. Cleansed with: Clean wound with Normal Saline 4. Dressing Applied: Other dressing (specify in notes) 5. Secondary Dressing Applied ABD and Kerlix/Conform Notes Telfa non-stick, stretch netting #3 Electronic Signature(s) Signed: 11/03/2016 9:52:54 AM By: Elliot GurneyWoody, BSN, RN, CWS, Kim RN, BSN Entered By: Elliot GurneyWoody, BSN, RN, CWS, Kim on 11/03/2016 09:27:27 Christian LarocheIGGSBEE, Daric J.  ([id number]) -------------------------------------------------------------------------------- Vitals Details Patient Name: Christian LarocheIGGSBEE, Eeshan J. Date of Service:  11/03/2016 9:15 AM Medical Record Number: [id number] Patient Account Number: [id number] Date of Birth/Sex: 02-05-1927 (83 y.o. Male) Treating RN: Huel Coventry Primary Care Mariane Burpee: Einar Crow Other Clinician: Referring Cayce Quezada: Einar Crow Treating Demeka Sutter/Extender: Rudene Re in Treatment: 0 Vital Signs Time Taken: 09:20 Pulse (bpm): 62 Height (in): 69 Respiratory Rate (breaths/min): 16 Weight (lbs): 125 Blood Pressure (mmHg): 128/63 Body Mass Index (BMI): 18.5 Reference Range: 80 - 120 mg / dl Electronic Signature(s) Signed: 11/03/2016 9:52:54 AM By: Elliot Gurney, BSN, RN, CWS, Kim RN, BSN Entered By: Elliot Gurney, BSN, RN, CWS, Kim on 11/03/2016 09:21:14

## 2016-11-11 ENCOUNTER — Encounter: Payer: Medicare Other | Attending: Surgery | Admitting: Surgery

## 2016-11-11 DIAGNOSIS — X58XXXA Exposure to other specified factors, initial encounter: Secondary | ICD-10-CM | POA: Diagnosis not present

## 2016-11-11 DIAGNOSIS — M109 Gout, unspecified: Secondary | ICD-10-CM | POA: Insufficient documentation

## 2016-11-11 DIAGNOSIS — E785 Hyperlipidemia, unspecified: Secondary | ICD-10-CM | POA: Diagnosis not present

## 2016-11-11 DIAGNOSIS — N182 Chronic kidney disease, stage 2 (mild): Secondary | ICD-10-CM | POA: Insufficient documentation

## 2016-11-11 DIAGNOSIS — S51002A Unspecified open wound of left elbow, initial encounter: Secondary | ICD-10-CM | POA: Insufficient documentation

## 2016-11-11 DIAGNOSIS — S51012A Laceration without foreign body of left elbow, initial encounter: Secondary | ICD-10-CM | POA: Insufficient documentation

## 2016-11-11 DIAGNOSIS — I129 Hypertensive chronic kidney disease with stage 1 through stage 4 chronic kidney disease, or unspecified chronic kidney disease: Secondary | ICD-10-CM | POA: Insufficient documentation

## 2016-11-11 DIAGNOSIS — E11622 Type 2 diabetes mellitus with other skin ulcer: Secondary | ICD-10-CM | POA: Insufficient documentation

## 2016-11-11 DIAGNOSIS — N400 Enlarged prostate without lower urinary tract symptoms: Secondary | ICD-10-CM | POA: Diagnosis not present

## 2016-11-11 DIAGNOSIS — E1122 Type 2 diabetes mellitus with diabetic chronic kidney disease: Secondary | ICD-10-CM | POA: Insufficient documentation

## 2016-11-13 NOTE — Progress Notes (Signed)
JAQUAVIAN, FIRKUS ([id number]) Visit Report for 11/11/2016 Chief Complaint Document Details Patient Name: [name], [name] Date of Service: 11/11/2016 3:30 PM Medical Record Number: [id number] Patient Account Number: [id number] Date of Birth/Sex: [birth year] (83 y.o. Male) Treating RN: Phillis Haggis Primary Care Provider: Einar Crow Other Clinician: Referring Provider: Einar Crow Treating Provider/Extender: Rudene Re in Treatment: 2 Information Obtained from: Patient Chief Complaint Patient seen for complaints of Non-Healing Wound to the left elbow region and lateral forearm which she's had for a week Electronic Signature(s) Signed: 11/11/2016 3:48:14 PM By: Evlyn Kanner MD, FACS Entered By: Evlyn Kanner on 11/11/2016 15:46:39 [name], [name] ([id number]) -------------------------------------------------------------------------------- HPI Details Patient Name: [name] Date of Service: 11/11/2016 3:30 PM Medical Record Number: [id number] Patient Account Number: [id number] Date of Birth/Sex: 1926-05-14 (83 y.o. Male) Treating RN: Phillis Haggis Primary Care Provider: Einar Crow Other Clinician: Referring Provider: Einar Crow Treating Provider/Extender: Rudene Re in Treatment: 2 History of Present Illness Location: left lateral forearm and elbow Quality: Patient reports experiencing a sharp pain to affected area(s). Severity: Patient states wound are getting worse. Duration: Patient has had the wound for < 2 weeks prior to presenting for treatment Timing: Pain in wound is Intermittent (comes and goes Context: The wound occurred when the patient had a syncopal attack and fall Modifying Factors: Other treatment(s) tried include:went to the ER for Steri-Strips and Dermabond Associated Signs and Symptoms: Patient reports having increase discharge. HPI Description: 83 year old patient had a syncopal attack and a  fall on 10/21/2016 and had a injury to his left elbow which was treated in the ER after appropriate x-rays were reviewed. The wound was closed with Steri-Strips and Dermabond and was asked to follow-up at the wound center. past medical history significant for type 2 diabetes mellitus, hyperlipidemia, benign hypertension, stage II chronic kidney disease, gout, prostatic hypertrophy, status post EGD and colonoscope he and right hip fracture repair. he has never been a smoker. Lab work done on 09/20/2016 was reviewed and his hemoglobin A1c was 6.3% 11/03/2016 -- he has been very compliant with his dressing changes and is gotten them done by his granddaughter who has taken excellent care of him Electronic Signature(s) Signed: 11/11/2016 3:48:14 PM By: Evlyn Kanner MD, FACS Entered By: Evlyn Kanner on 11/11/2016 15:46:45 [name] ([id number]) -------------------------------------------------------------------------------- Physical Exam Details Patient Name: [name] Date of Service: 11/11/2016 3:30 PM Medical Record Number: [id number] Patient Account Number: [id number] Date of Birth/Sex: March 16, 1926 (83 y.o. Male) Treating RN: Phillis Haggis Primary Care Provider: Einar Crow Other Clinician: Referring Provider: Einar Crow Treating Provider/Extender: Rudene Re in Treatment: 2 Constitutional . Pulse regular. Respirations normal and unlabored. Afebrile. . Eyes Nonicteric. Reactive to light. Ears, Nose, Mouth, and Throat Lips, teeth, and gums WNL.Marland Kitchen Moist mucosa without lesions. Neck supple and nontender. No palpable supraclavicular or cervical adenopathy. Normal sized without goiter. Respiratory WNL. No retractions.. Cardiovascular Pedal Pulses WNL. No clubbing, cyanosis or edema. Lymphatic No adneopathy. No adenopathy. No adenopathy. Musculoskeletal Adexa without tenderness or enlargement.. Digits and nails w/o clubbing, cyanosis,  infection, petechiae, ischemia, or inflammatory conditions.. Integumentary (Hair, Skin) No suspicious lesions. No crepitus or fluctuance. No peri-wound warmth or erythema. No masses.Marland Kitchen Psychiatric Judgement and insight Intact.. No evidence of depression, anxiety, or agitation.. Notes the wound is looking excellent with minimal hyper granulation tissue and other than that there is no surrounding cellulitis Electronic Signature(s) Signed: 11/11/2016 3:48:14 PM By: Evlyn Kanner MD, FACS Entered By: Evlyn Kanner on 11/11/2016 15:47:14 [name], Christian  Shela [name] ([id number]) -------------------------------------------------------------------------------- Physician Orders Details Patient Name: [name] Date of Service: 11/11/2016 3:30 PM Medical Record Number: [id number] Patient Account Number: [id number] Date of Birth/Sex: January 09, 1927 (83 y.o. Male) Treating RN: Phillis Haggis Primary Care Provider: Einar Crow Other Clinician: Referring Provider: Einar Crow Treating Provider/Extender: Rudene Re in Treatment: 2 Verbal / Phone Orders: No Diagnosis Coding Wound Cleansing Wound #1 Left Elbow o Clean wound with Normal Saline. o Cleanse wound with mild soap and water o May Shower, gently pat wound dry prior to applying new dressing. Primary Wound Dressing Wound #1 Left Elbow o Hydrafera Blue Secondary Dressing Wound #1 Left Elbow o ABD pad o Conform/Kerlix o Other - stretch netting #4 Dressing Change Frequency Wound #1 Left Elbow o Change dressing every other day. Follow-up Appointments Wound #1 Left Elbow o Return Appointment in 1 week. Additional Orders / Instructions Wound #1 Left Elbow o Increase protein intake. Electronic Signature(s) Signed: 11/11/2016 3:48:14 PM By: Evlyn Kanner MD, FACS Signed: 11/11/2016 4:50:20 PM By: Alejandro Mulling Entered By: Alejandro Mulling on 11/11/2016 15:43:50 [name], [name]  ([id number]) [name], [name] ([id number]) -------------------------------------------------------------------------------- Problem List Details Patient Name: [name] Date of Service: 11/11/2016 3:30 PM Medical Record Number: [id number] Patient Account Number: [id number] Date of Birth/Sex: 1926/05/22 (83 y.o. Male) Treating RN: Phillis Haggis Primary Care Provider: Einar Crow Other Clinician: Referring Provider: Einar Crow Treating Provider/Extender: Rudene Re in Treatment: 2 Active Problems ICD-10 Encounter Code Description Active Date Diagnosis E11.622 Type 2 diabetes mellitus with other skin ulcer 10/28/2016 Yes S51.012A Laceration without foreign body of left elbow, initial 10/28/2016 Yes encounter S51.002A Unspecified open wound of left elbow, initial encounter 10/28/2016 Yes Inactive Problems Resolved Problems Electronic Signature(s) Signed: 11/11/2016 3:48:14 PM By: Evlyn Kanner MD, FACS Entered By: Evlyn Kanner on 11/11/2016 15:46:28 [name] ([id number]) -------------------------------------------------------------------------------- Progress Note Details Patient Name: [name] Date of Service: 11/11/2016 3:30 PM Medical Record Number: [id number] Patient Account Number: [id number] Date of Birth/Sex: [birth year] (83 y.o. Male) Treating RN: Phillis Haggis Primary Care Provider: Einar Crow Other Clinician: Referring Provider: Einar Crow Treating Provider/Extender: Rudene Re in Treatment: 2 Subjective Chief Complaint Information obtained from Patient Patient seen for complaints of Non-Healing Wound to the left elbow region and lateral forearm which she's had for a week History of Present Illness (HPI) The following HPI elements were documented for the patient's wound: Location: left lateral forearm and elbow Quality: Patient reports experiencing a sharp pain to affected  area(s). Severity: Patient states wound are getting worse. Duration: Patient has had the wound for < 2 weeks prior to presenting for treatment Timing: Pain in wound is Intermittent (comes and goes Context: The wound occurred when the patient had a syncopal attack and fall Modifying Factors: Other treatment(s) tried include:went to the ER for Steri-Strips and Dermabond Associated Signs and Symptoms: Patient reports having increase discharge. 83 year old patient had a syncopal attack and a fall on 10/21/2016 and had a injury to his left elbow which was treated in the ER after appropriate x-rays were reviewed. The wound was closed with Steri-Strips and Dermabond and was asked to follow-up at the wound center. past medical history significant for type 2 diabetes mellitus, hyperlipidemia, benign hypertension, stage II chronic kidney disease, gout, prostatic hypertrophy, status post EGD and colonoscope he and right hip fracture repair. he has never been a smoker. Lab work done on 09/20/2016 was reviewed and his hemoglobin A1c was 6.3% 11/03/2016 -- he has been very compliant with his dressing  changes and is gotten them done by his granddaughter who has taken excellent care of him Objective [name], OQUENDO. ([id number]) Constitutional Pulse regular. Respirations normal and unlabored. Afebrile. Vitals Time Taken: 3:31 PM, Height: 69 in, Weight: 125 lbs, BMI: 18.5, Pulse: 67 bpm, Respiratory Rate: 16 breaths/min, Blood Pressure: 148/69 mmHg. Eyes Nonicteric. Reactive to light. Ears, Nose, Mouth, and Throat Lips, teeth, and gums WNL.Marland Kitchen Moist mucosa without lesions. Neck supple and nontender. No palpable supraclavicular or cervical adenopathy. Normal sized without goiter. Respiratory WNL. No retractions.. Cardiovascular Pedal Pulses WNL. No clubbing, cyanosis or edema. Lymphatic No adneopathy. No adenopathy. No adenopathy. Musculoskeletal Adexa without tenderness or enlargement.. Digits  and nails w/o clubbing, cyanosis, infection, petechiae, ischemia, or inflammatory conditions.Marland Kitchen Psychiatric Judgement and insight Intact.. No evidence of depression, anxiety, or agitation.. General Notes: the wound is looking excellent with minimal hyper granulation tissue and other than that there is no surrounding cellulitis Integumentary (Hair, Skin) No suspicious lesions. No crepitus or fluctuance. No peri-wound warmth or erythema. No masses.. Wound #1 status is Open. Original cause of wound was Trauma. The wound is located on the Left Elbow. The wound measures 1cm length x 1.5cm width x 0.1cm depth; 1.178cm^2 area and 0.118cm^3 volume. There is Fat Layer (Subcutaneous Tissue) Exposed exposed. There is no tunneling or undermining noted. There is a large amount of serosanguineous drainage noted. The wound margin is distinct with the outline attached to the wound base. There is large (67-100%) red, pink granulation within the wound bed. There is no necrotic tissue within the wound bed. The periwound skin appearance exhibited: Scarring. The periwound skin appearance did not exhibit: Callus, Crepitus, Excoriation, Induration, Rash, Dry/Scaly, Maceration, Atrophie Blanche, Cyanosis, Ecchymosis, Hemosiderin Staining, Mottled, Pallor, Rubor, Erythema. Periwound temperature was noted as No Abnormality. The periwound has tenderness on palpation. [name], [name] ([id number]) Assessment Active Problems ICD-10 E11.622 - Type 2 diabetes mellitus with other skin ulcer S51.012A - Laceration without foreign body of left elbow, initial encounter S51.002A - Unspecified open wound of left elbow, initial encounter Plan Wound Cleansing: Wound #1 Left Elbow: Clean wound with Normal Saline. Cleanse wound with mild soap and water May Shower, gently pat wound dry prior to applying new dressing. Primary Wound Dressing: Wound #1 Left Elbow: Hydrafera Blue Secondary Dressing: Wound #1 Left Elbow: ABD  pad Conform/Kerlix Other - stretch netting #4 Dressing Change Frequency: Wound #1 Left Elbow: Change dressing every other day. Follow-up Appointments: Wound #1 Left Elbow: Return Appointment in 1 week. Additional Orders / Instructions: Wound #1 Left Elbow: Increase protein intake. after review today, I have recommended: 1. Hydrofera Blue and a Kerlix to be applied every other day [name], Christian KNOTEK. ([id number]) 2. May wash with soap and water 3. Continue good care of his diet-controlled diabetes mellitus 4. Regular visits the wound center Electronic Signature(s) Signed: 11/11/2016 3:48:14 PM By: Evlyn Kanner MD, FACS Entered By: Evlyn Kanner on 11/11/2016 15:47:49 [name], [name] ([id number]) -------------------------------------------------------------------------------- SuperBill Details Patient Name: [name] Date of Service: 11/11/2016 Medical Record Number: [id number] Patient Account Number: [id number] Date of Birth/Sex: 01/07/1927 (83 y.o. Male) Treating RN: Phillis Haggis Primary Care Provider: Einar Crow Other Clinician: Referring Provider: Einar Crow Treating Provider/Extender: Rudene Re in Treatment: 2 Diagnosis Coding ICD-10 Codes Code Description [phone] Type 2 diabetes mellitus with other skin ulcer S51.012A Laceration without foreign body of left elbow, initial encounter S51.002A Unspecified open wound of left elbow, initial encounter Facility Procedures CPT4 Code: [id number] Description: 99213 - WOUND CARE VISIT-LEV 3 EST  PT Modifier: Quantity: 1 Physician Procedures CPT4 Code: [id number] Description: 99213 - WC PHYS LEVEL 3 - EST PT ICD-10 Description Diagnosis E11.622 Type 2 diabetes mellitus with other skin ulcer S51.012A Laceration without foreign body of left elbow, ini S51.002A Unspecified open wound of left elbow, initial enco Modifier: tial encounter unter Quantity: 1 Electronic Signature(s) Signed:  11/11/2016 4:29:57 PM By: Evlyn KannerBritto, Laketta Soderberg MD, FACS Signed: 11/11/2016 4:50:20 PM By: Alejandro MullingPinkerton, Debra Previous Signature: 11/11/2016 3:48:14 PM Version By: Evlyn KannerBritto, Donzell Coller MD, FACS Entered By: Alejandro MullingPinkerton, Debra on 11/11/2016 15:55:14

## 2016-11-14 NOTE — Progress Notes (Signed)
Christian, [name] ([id number]) Visit Report for 11/11/2016 Arrival Information Details Patient Name: [name], [name] Date of Service: 11/11/2016 3:30 PM Medical Record Number: [id number] Patient Account Number: [id number] Date of Birth/Sex: [birth year] (83 y.o. Male) Treating RN: Phillis Haggis Primary Care Daemion Mcniel: Einar Crow Other Clinician: Referring Melody Savidge: Einar Crow Treating Orazio Weller/Extender: Rudene Re in Treatment: 2 Visit Information History Since Last Visit All ordered tests and consults were completed: No Patient Arrived: Wheel Chair Added or deleted any medications: No Arrival Time: 15:30 Any new allergies or adverse reactions: No Accompanied By: granddaughter Had a fall or experienced change in No Transfer Assistance: EasyPivot Patient activities of daily living that may affect Lift risk of falls: Patient Identification Verified: Yes Signs or symptoms of abuse/neglect since last No Secondary Verification Process Yes visito Completed: Hospitalized since last visit: No Patient Requires Transmission- No Has Dressing in Place as Prescribed: Yes Based Precautions: Pain Present Now: No Patient Has Alerts: Yes Patient Alerts: DM II Electronic Signature(s) Signed: 11/11/2016 4:50:20 PM By: Alejandro Mulling Entered By: Alejandro Mulling on 11/11/2016 15:50:37 Broom, Corinna Gab ([id number]) -------------------------------------------------------------------------------- Clinic Level of Care Assessment Details Patient Name: [name] Date of Service: 11/11/2016 3:30 PM Medical Record Number: [id number] Patient Account Number: [id number] Date of Birth/Sex: April 20, 1926 (83 y.o. Male) Treating RN: Phillis Haggis Primary Care Zyan Mirkin: Einar Crow Other Clinician: Referring Ashyla Luth: Einar Crow Treating Sahian Kerney/Extender: Rudene Re in Treatment: 2 Clinic Level of Care Assessment Items TOOL 4 Quantity  Score X - Use when only an EandM is performed on FOLLOW-UP visit 1 0 ASSESSMENTS - Nursing Assessment / Reassessment X - Reassessment of Co-morbidities (includes updates in patient status) 1 10 X - Reassessment of Adherence to Treatment Plan 1 5 ASSESSMENTS - Wound and Skin Assessment / Reassessment X - Simple Wound Assessment / Reassessment - one wound 1 5  - Complex Wound Assessment / Reassessment - multiple wounds 0  - Dermatologic / Skin Assessment (not related to wound area) 0 ASSESSMENTS - Focused Assessment  - Circumferential Edema Measurements - multi extremities 0  - Nutritional Assessment / Counseling / Intervention 0  - Lower Extremity Assessment (monofilament, tuning fork, pulses) 0  - Peripheral Arterial Disease Assessment (using hand held doppler) 0 ASSESSMENTS - Ostomy and/or Continence Assessment and Care  - Incontinence Assessment and Management 0  - Ostomy Care Assessment and Management (repouching, etc.) 0 PROCESS - Coordination of Care X - Simple Patient / Family Education for ongoing care 1 15  - Complex (extensive) Patient / Family Education for ongoing care 0  - Staff obtains Chiropractor, Records, Test Results / Process Orders 0  - Staff telephones HHA, Nursing Homes / Clarify orders / etc 0  - Routine Transfer to another Facility (non-emergent condition) 0 Heese, [place] J. ([id number])  - Routine Hospital Admission (non-emergent condition) 0  - New Admissions / Manufacturing engineer / Ordering NPWT, Apligraf, etc. 0  - Emergency Hospital Admission (emergent condition) 0 X - Simple Discharge Coordination 1 10  - Complex (extensive) Discharge Coordination 0 PROCESS - Special Needs  - Pediatric / Minor Patient Management 0  - Isolation Patient Management 0  - Hearing / Language / Visual special needs 0  - Assessment of Community assistance (transportation, D/C planning, etc.) 0  - Additional assistance / Altered  mentation 0  - Support Surface(s) Assessment (bed, cushion, seat, etc.) 0 INTERVENTIONS - Wound Cleansing / Measurement X - Simple Wound Cleansing - one wound 1 5  - Complex Wound Cleansing - multiple  wounds 0 X - Wound Imaging (photographs - any number of wounds) 1 5  - Wound Tracing (instead of photographs) 0 X - Simple Wound Measurement - one wound 1 5  - Complex Wound Measurement - multiple wounds 0 INTERVENTIONS - Wound Dressings X - Small Wound Dressing one or multiple wounds 1 10  - Medium Wound Dressing one or multiple wounds 0  - Large Wound Dressing one or multiple wounds 0 X - Application of Medications - topical 1 5  - Application of Medications - injection 0 INTERVENTIONS - Miscellaneous  - External ear exam 0 Lamantia, Mattew J. ([id number])  - Specimen Collection (cultures, biopsies, blood, body fluids, etc.) 0  - Specimen(s) / Culture(s) sent or taken to Lab for analysis 0  - Patient Transfer (multiple staff / Michiel Sites Lift / Similar devices) 0  - Simple Staple / Suture removal (25 or less) 0  - Complex Staple / Suture removal (26 or more) 0  - Hypo / Hyperglycemic Management (close monitor of Blood Glucose) 0  - Ankle / Brachial Index (ABI) - do not check if billed separately 0 X - Vital Signs 1 5 Has the patient been seen at the hospital within the last three years: Yes Total Score: 80 Level Of Care: New/Established - Level 3 Electronic Signature(s) Signed: 11/11/2016 4:50:20 PM By: Alejandro Mulling Entered By: Alejandro Mulling on 11/11/2016 15:55:06 Dunnavant, Corinna Gab ([id number]) -------------------------------------------------------------------------------- Encounter Discharge Information Details Patient Name: [name] Date of Service: 11/11/2016 3:30 PM Medical Record Number: [id number] Patient Account Number: [id number] Date of Birth/Sex: Oct 26, 1926 (83 y.o. Male) Treating RN: Phillis Haggis Primary Care Latona Krichbaum:  Einar Crow Other Clinician: Referring Tushar Enns: Einar Crow Treating Aalia Greulich/Extender: Rudene Re in Treatment: 2 Encounter Discharge Information Items Discharge Pain Level: 0 Discharge Condition: Stable Ambulatory Status: Wheelchair Discharge Destination: Home Transportation: Private Auto Accompanied By: granddaughter Schedule Follow-up Appointment: Yes Medication Reconciliation completed and provided to Patient/Care No Keona Sheffler: Provided on Clinical Summary of Care: 11/11/2016 Form Type Recipient Paper Patient CR Electronic Signature(s) Signed: 11/14/2016 10:12:15 AM By: Gwenlyn Perking Entered By: Gwenlyn Perking on 11/11/2016 15:51:24 Albano, Corinna Gab ([id number]) -------------------------------------------------------------------------------- Lower Extremity Assessment Details Patient Name: [name] Date of Service: 11/11/2016 3:30 PM Medical Record Number: [id number] Patient Account Number: [id number] Date of Birth/Sex: May 07, 1926 (83 y.o. Male) Treating RN: Phillis Haggis Primary Care Raychell Holcomb: Einar Crow Other Clinician: Referring Angelle Isais: Einar Crow Treating Emmalia Heyboer/Extender: Rudene Re in Treatment: 2 Electronic Signature(s) Signed: 11/11/2016 4:50:20 PM By: Alejandro Mulling Entered By: Alejandro Mulling on 11/11/2016 15:34:24 Dames, Corinna Gab ([id number]) -------------------------------------------------------------------------------- Multi Wound Chart Details Patient Name: [name] Date of Service: 11/11/2016 3:30 PM Medical Record Number: [id number] Patient Account Number: [id number] Date of Birth/Sex: [birth year] (83 y.o. Male) Treating RN: Phillis Haggis Primary Care Laporshia Hogen: Einar Crow Other Clinician: Referring Lilly Gasser: Einar Crow Treating Keeghan Bialy/Extender: Rudene Re in Treatment: 2 Vital Signs Height(in): 69 Pulse(bpm): 67 Weight(lbs): 125 Blood  Pressure 148/69 (mmHg): Body Mass Index(BMI): 18 Temperature(F): Respiratory Rate 16 (breaths/min): Photos: [1:No Photos] [N/A:N/A] Wound Location: [1:Left Elbow] [N/A:N/A] Wounding Event: [1:Trauma] [N/A:N/A] Primary Etiology: [1:Trauma, Other] [N/A:N/A] Comorbid History: [1:Anemia, Hypertension, Type II Diabetes, Osteoarthritis, Neuropathy] [N/A:N/A] Date Acquired: [1:10/21/2016] [N/A:N/A] Weeks of Treatment: [1:2] [N/A:N/A] Wound Status: [1:Open] [N/A:N/A] Clustered Wound: [1:Yes] [N/A:N/A] Clustered Quantity: [1:4] [N/A:N/A] Measurements L x W x D 1x1.5x0.1 [N/A:N/A] (cm) Area (cm) : [1:1.178] [N/A:N/A] Volume (cm) : [1:0.118] [N/A:N/A] % Reduction in Area: [1:95.10%] [N/A:N/A] % Reduction in Volume: 95.10% [N/A:N/A] Classification: [1:Partial Thickness] [N/A:N/A]  Exudate Amount: [1:Large] [N/A:N/A] Exudate Type: [1:Serosanguineous] [N/A:N/A] Exudate Color: [1:red, brown] [N/A:N/A] Wound Margin: [1:Distinct, outline attached N/A] Granulation Amount: [1:Large (67-100%)] [N/A:N/A] Granulation Quality: [1:Red, Pink] [N/A:N/A] Necrotic Amount: [1:None Present (0%)] [N/A:N/A] Exposed Structures: [1:Fat Layer (Subcutaneous N/A Tissue) Exposed: Yes] Epithelialization: [1:Large (67-100%)] [N/A:N/A] Periwound Skin Texture: Scarring: Yes N/A N/A Excoriation: No Induration: No Callus: No Crepitus: No Rash: No Periwound Skin Maceration: No N/A N/A Moisture: Dry/Scaly: No Periwound Skin Color: Atrophie Blanche: No N/A N/A Cyanosis: No Ecchymosis: No Erythema: No Hemosiderin Staining: No Mottled: No Pallor: No Rubor: No Temperature: No Abnormality N/A N/A Tenderness on Yes N/A N/A Palpation: Wound Preparation: Ulcer Cleansing: N/A N/A Rinsed/Irrigated with Saline Topical Anesthetic Applied: Other: lidocaine 4% Treatment Notes Wound #1 (Left Elbow) 1. Cleansed with: Clean wound with Normal Saline 2. Anesthetic Topical Lidocaine 4% cream to wound bed prior  to debridement 4. Dressing Applied: Hydrafera Blue 5. Secondary Dressing Applied ABD Pad Kerlix/Conform 7. Secured with Tape Notes stretch netting #3 Electronic Signature(s) Signed: 11/11/2016 3:48:14 PM By: Evlyn KannerBritto, Errol MD, FACS Entered By: Evlyn KannerBritto, Errol on 11/11/2016 15:46:33 Christian LarocheRIGGSBEE, Ender J. ([id number]) Christian LarocheRIGGSBEE, Tylique J. ([id number]) -------------------------------------------------------------------------------- Multi-Disciplinary Care Plan Details Patient Name: Christian LarocheIGGSBEE, Lyle J. Date of Service: 11/11/2016 3:30 PM Medical Record Number: [id number] Patient Account Number: [id number] Date of Birth/Sex: 07/24/1926 (83 y.o. Male) Treating RN: Phillis HaggisPinkerton, Debi Primary Care Rickiya Picariello: Einar CrowAnderson, Marshall Other Clinician: Referring Sharma Lawrance: Einar CrowAnderson, Marshall Treating Jenniffer Vessels/Extender: Rudene ReBritto, Errol Weeks in Treatment: 2 Active Inactive ` Abuse / Safety / Falls / Self Care Management Nursing Diagnoses: History of Falls Potential for falls Goals: Patient will not experience any injury related to falls Date Initiated: 10/28/2016 Target Resolution Date: 02/11/2017 Goal Status: Active Interventions: Assess Activities of Daily Living upon admission and as needed Assess fall risk on admission and as needed Notes: ` Nutrition Nursing Diagnoses: Imbalanced nutrition Impaired glucose control: actual or potential Potential for alteratiion in Nutrition/Potential for imbalanced nutrition Goals: Patient/caregiver agrees to and verbalizes understanding of need to use nutritional supplements and/or vitamins as prescribed Date Initiated: 10/28/2016 Target Resolution Date: 01/14/2017 Goal Status: Active Patient/caregiver will maintain therapeutic glucose control Date Initiated: 10/28/2016 Target Resolution Date: 02/11/2017 Goal Status: Active Interventions: Assess patient nutrition upon admission and as needed per policy Christian LarocheRIGGSBEE, Moss J.  ([id number]) Notes: ` Orientation to the Wound Care Program Nursing Diagnoses: Knowledge deficit related to the wound healing center program Goals: Patient/caregiver will verbalize understanding of the Wound Healing Center Program Date Initiated: 10/28/2016 Target Resolution Date: 11/12/2016 Goal Status: Active Interventions: Provide education on orientation to the wound center Notes: ` Pain, Acute or Chronic Nursing Diagnoses: Pain, acute or chronic: actual or potential Potential alteration in comfort, pain Goals: Patient/caregiver will verbalize adequate pain control between visits Date Initiated: 10/28/2016 Target Resolution Date: 02/11/2017 Goal Status: Active Interventions: Complete pain assessment as per visit requirements Notes: ` Wound/Skin Impairment Nursing Diagnoses: Impaired tissue integrity Knowledge deficit related to ulceration/compromised skin integrity Goals: Ulcer/skin breakdown will have a volume reduction of 80% by week 12 Date Initiated: 10/28/2016 Target Resolution Date: 02/04/2017 Goal Status: Active Christian LarocheRIGGSBEE, Huie J. ([id number]) Interventions: Assess patient/caregiver ability to perform ulcer/skin care regimen upon admission and as needed Notes: Electronic Signature(s) Signed: 11/11/2016 4:50:20 PM By: Alejandro MullingPinkerton, Debra Entered By: Alejandro MullingPinkerton, Debra on 11/11/2016 15:34:28 Christian LarocheIGGSBEE, Dareion J. ([id number]) -------------------------------------------------------------------------------- Pain Assessment Details Patient Name: Christian LarocheIGGSBEE, Khyre J. Date of Service: 11/11/2016 3:30 PM Medical Record Number: [id number] Patient Account Number: [id number] Date of Birth/Sex: 12/23/1926 27(83 y.o. Male) Treating RN: Phillis HaggisPinkerton, Debi Primary Care Soua Lenk:  Einar Crow Other Clinician: Referring Dartanion Teo: Einar Crow Treating Samariyah Cowles/Extender: Rudene Re in Treatment: 2 Active Problems Location of Pain Severity and Description of  Pain Patient Has Paino No Site Locations Pain Management and Medication Current Pain Management: Electronic Signature(s) Signed: 11/11/2016 4:50:20 PM By: Alejandro Mulling Entered By: Alejandro Mulling on 11/11/2016 15:31:22 Behringer, Corinna Gab ([id number]) -------------------------------------------------------------------------------- Patient/Caregiver Education Details Patient Name: [name] Date of Service: 11/11/2016 3:30 PM Medical Record Number: [id number] Patient Account Number: [id number] Date of Birth/Gender: 29-Jun-1926 (83 y.o. Male) Treating RN: Phillis Haggis Primary Care Physician: Einar Crow Other Clinician: Referring Physician: Einar Crow Treating Physician/Extender: Rudene Re in Treatment: 2 Education Assessment Education Provided To: Patient Education Topics Provided Wound/Skin Impairment: Handouts: Other: change dressing as ordered Methods: Demonstration, Explain/Verbal Responses: State content correctly Electronic Signature(s) Signed: 11/11/2016 4:50:20 PM By: Alejandro Mulling Entered By: Alejandro Mulling on 11/11/2016 15:43:14 Yetman, Corinna Gab ([id number]) -------------------------------------------------------------------------------- Wound Assessment Details Patient Name: [name] Date of Service: 11/11/2016 3:30 PM Medical Record Number: [id number] Patient Account Number: [id number] Date of Birth/Sex: [birth year] (83 y.o. Male) Treating RN: Phillis Haggis Primary Care Jorja Empie: Einar Crow Other Clinician: Referring Zyheir Daft: Einar Crow Treating Zeanna Sunde/Extender: Rudene Re in Treatment: 2 Wound Status Wound Number: 1 Primary Trauma, Other Etiology: Wound Location: Left Elbow Wound Open Wounding Event: Trauma Status: Date Acquired: 10/21/2016 Comorbid Anemia, Hypertension, Type II Weeks Of Treatment: 2 History: Diabetes, Osteoarthritis, Neuropathy Clustered Wound:  Yes Wound Measurements Length: (cm) 1 Width: (cm) 1.5 Depth: (cm) 0.1 Clustered Quantity: 4 Area: (cm) 1.178 Volume: (cm) 0.118 % Reduction in Area: 95.1% % Reduction in Volume: 95.1% Epithelialization: Large (67-100%) Tunneling: No Undermining: No Wound Description Classification: Partial Thickness Wound Margin: Distinct, outline attached Exudate Amount: Large Exudate Type: Serosanguineous Exudate Color: red, brown Foul Odor After Cleansing: No Slough/Fibrino No Wound Bed Granulation Amount: Large (67-100%) Exposed Structure Granulation Quality: Red, Pink Fat Layer (Subcutaneous Tissue) Exposed: Yes Necrotic Amount: None Present (0%) Periwound Skin Texture Texture Color No Abnormalities Noted: No No Abnormalities Noted: No Callus: No Atrophie Blanche: No Crepitus: No Cyanosis: No Excoriation: No Ecchymosis: No Induration: No Erythema: No Rash: No Hemosiderin Staining: No Scarring: Yes Mottled: No Pallor: No Moisture Rubor: No No Abnormalities Noted: No Finigan, Jamoni J. ([id number]) Dry / Scaly: No Temperature / Pain Maceration: No Temperature: No Abnormality Tenderness on Palpation: Yes Wound Preparation Ulcer Cleansing: Rinsed/Irrigated with Saline Topical Anesthetic Applied: Other: lidocaine 4%, Treatment Notes Wound #1 (Left Elbow) 1. Cleansed with: Clean wound with Normal Saline 2. Anesthetic Topical Lidocaine 4% cream to wound bed prior to debridement 4. Dressing Applied: Hydrafera Blue 5. Secondary Dressing Applied ABD Pad Kerlix/Conform 7. Secured with Tape Notes stretch netting #3 Electronic Signature(s) Signed: 11/11/2016 4:50:20 PM By: Alejandro Mulling Entered By: Alejandro Mulling on 11/11/2016 15:37:32 MONTEY, EBEL ([id number]) -------------------------------------------------------------------------------- Vitals Details Patient Name: [name] Date of Service: 11/11/2016 3:30 PM Medical Record Number:  [id number] Patient Account Number: [id number] Date of Birth/Sex: 1926-11-05 (83 y.o. Male) Treating RN: Phillis Haggis Primary Care Sharron Simpson: Einar Crow Other Clinician: Referring Raya Mckinstry: Einar Crow Treating Maciah Feeback/Extender: Rudene Re in Treatment: 2 Vital Signs Time Taken: 15:31 Pulse (bpm): 67 Height (in): 69 Respiratory Rate (breaths/min): 16 Weight (lbs): 125 Blood Pressure (mmHg): 148/69 Body Mass Index (BMI): 18.5 Reference Range: 80 - 120 mg / dl Electronic Signature(s) Signed: 11/11/2016 4:50:20 PM By: Alejandro Mulling Entered By: Alejandro Mulling on 11/11/2016 15:33:08

## 2016-11-21 ENCOUNTER — Encounter: Payer: Medicare Other | Admitting: Surgery

## 2016-11-21 DIAGNOSIS — E11622 Type 2 diabetes mellitus with other skin ulcer: Secondary | ICD-10-CM | POA: Diagnosis not present

## 2016-11-21 NOTE — Progress Notes (Signed)
[name], [name] ([id number]) Visit Report for 11/21/2016 Arrival Information Details Patient Name: [name], [name] Date of Service: 11/21/2016 2:15 PM Medical Record Number: [id number] Patient Account Number: [id number] Date of Birth/Sex: 01/19/1927 (83 y.o. Male) Treating RN: [name] Primary Care [name] Other Clinician: Referring [name] Treating [name] in Treatment: 3 Visit Information History Since Last Visit Added or deleted any medications: No Patient Arrived: Wheel Chair Any new allergies or adverse reactions: No Arrival Time: 14:17 Had a fall or experienced change in No activities of daily living that may affect Accompanied By: spouse risk of falls: Transfer Assistance: None Signs or symptoms of abuse/neglect since last No Patient Identification Verified: Yes visito Secondary Verification Process Yes Hospitalized since last visit: No Completed: Has Dressing in Place as Prescribed: Yes Patient Requires Transmission-Based No Pain Present Now: No Precautions: Patient Has Alerts: Yes Patient Alerts: DM II Electronic Signature(s) Signed: 11/21/2016 2:51:05 PM By: [name] Entered By: [name] on 11/21/2016 14:17:47 [name] ([id number]) -------------------------------------------------------------------------------- Encounter Discharge Information Details Patient Name: [name] Date of Service: 11/21/2016 2:15 PM Medical Record Number: [id number] Patient Account Number: [id number] Date of Birth/Sex: 1927/02/01 (83 y.o. Male) Treating RN: [name] Primary Care Brandilynn Taormina: [name] Other Clinician: Referring Momoko Slezak: [name] Treating Nakota Elsen/Extender: [name] in Treatment: 3 Encounter Discharge Information Items Discharge Pain Level: 0 Discharge Condition: Stable Ambulatory Status: Wheelchair Discharge  Destination: Home Transportation: Private Auto Accompanied By: spouse Schedule Follow-up Appointment: Yes Medication Reconciliation completed and provided to Patient/Care No [name]: Provided on Clinical Summary of Care: 11/21/2016 Form Type Recipient Paper Patient CR Electronic Signature(s) Signed: 11/21/2016 2:51:05 PM By: [name] Entered By: [name] on 11/21/2016 14:48:08 [name] ([id number]) -------------------------------------------------------------------------------- Multi Wound Chart Details Patient Name: [name] Date of Service: 11/21/2016 2:15 PM Medical Record Number: [id number] Patient Account Number: [id number] Date of Birth/Sex: 22-Dec-1926 (83 y.o. Male) Treating RN: [name] Primary Care Minda Faas: [name] Other Clinician: Referring Brycin Kille: [name] Treating [name] in Treatment: 3 Vital Signs Height(in): 69 Pulse(bpm): 80 Weight(lbs): 125 Blood Pressure 115/56 (mmHg): Body Mass Index(BMI): 18 Temperature(F): 97.8 Respiratory Rate 16 (breaths/min): Photos: [N/A:N/A] Wound Location: Left Elbow N/A N/A Wounding Event: Trauma N/A N/A Primary Etiology: Trauma, Other N/A N/A Comorbid History: Anemia, Hypertension, N/A N/A Type II Diabetes, Osteoarthritis, Neuropathy Date Acquired: 10/21/2016 N/A N/A Weeks of Treatment: 3 N/A N/A Wound Status: Open N/A N/A Clustered Wound: Yes N/A N/A Clustered Quantity: 4 N/A N/A Measurements L x W x D 2.3x1.4x0.1 N/A N/A (cm) Area (cm) : 2.529 N/A N/A Volume (cm) : 0.253 N/A N/A % Reduction in Area: 89.40% N/A N/A % Reduction in Volume: 89.40% N/A N/A Classification: Partial Thickness N/A N/A Exudate Amount: Large N/A N/A Exudate Type: Serosanguineous N/A N/A Exudate Color: red, brown N/A N/A Wound Margin: Distinct, outline attached N/A N/A Granulation Amount: Large (67-100%) N/A N/A [name], [name]  ([id number]) Granulation Quality: Red, Pink, Hyper- N/A N/A granulation Necrotic Amount: None Present (0%) N/A N/A Exposed Structures: Fat Layer (Subcutaneous N/A N/A Tissue) Exposed: Yes Epithelialization: Medium (34-66%) N/A N/A Periwound Skin Texture: Scarring: Yes N/A N/A Excoriation: No Induration: No Callus: No Crepitus: No Rash: No Periwound Skin Maceration: No N/A N/A Moisture: Dry/Scaly: No Periwound Skin Color: Atrophie Blanche: No N/A N/A Cyanosis: No Ecchymosis: No Erythema: No Hemosiderin Staining: No Mottled: No Pallor: No [name]: No Abnormality N/A N/A Tenderness on Yes N/A N/A Palpation: Wound Preparation:  Ulcer Cleansing: N/A N/A Rinsed/Irrigated with Saline Topical Anesthetic Applied: Other: lidocaine 4% Procedures Performed: CHEM CAUT N/A N/A GRANULATION TISS Treatment Notes Wound #1 (Left Elbow) 1. Cleansed with: Clean wound with Normal Saline 2. Anesthetic Topical Lidocaine 4% cream to wound bed prior to debridement 4. Dressing Applied: Hydrafera Blue 5. Secondary Dressing Applied ABD and Kerlix/Conform 7. Secured with Tape Notes [name], [name] ([id number]) stretch netting #3 Electronic Signature(s) Signed: 11/21/2016 3:43:44 PM By: [name] Previous Signature: 11/21/2016 2:51:05 PM Version By: [name] Entered By: Christian Kanner on 11/21/2016 15:18:52 [name] -------------------------------------------------------------------------------- Multi-Disciplinary Care Plan Details Patient Name: [name] Date of Service: 11/21/2016 2:15 PM Medical Record Number: [id number] Patient Account Number: [id number] Date of Birth/Sex: [birth year] (83 y.o. Male) Treating RN: [name] Primary Care Jarel Cuadra: [name] Other Clinician: Referring Avianah Pellman: [name] Treating Madelyn Tlatelpa/Extender: [name] in Treatment: 3 Active Inactive ` Abuse  / Safety / Falls / Self Care Management Nursing Diagnoses: History of Falls Potential for falls Goals: Patient will not experience any injury related to falls Date Initiated: 10/28/2016 Target Resolution Date: 02/11/2017 Goal Status: Active Interventions: Assess Activities of Daily Living upon admission and as needed Assess fall risk on admission and as needed Notes: ` Nutrition Nursing Diagnoses: Imbalanced nutrition Impaired glucose control: actual or potential Potential for alteratiion in Nutrition/Potential for imbalanced nutrition Goals: Patient/caregiver agrees to and verbalizes understanding of need to use nutritional supplements and/or vitamins as prescribed Date Initiated: 10/28/2016 Target Resolution Date: 01/14/2017 Goal Status: Active Patient/caregiver will maintain therapeutic glucose control Date Initiated: 10/28/2016 Target Resolution Date: 02/11/2017 Goal Status: Active Interventions: Assess patient nutrition upon admission and as needed per policy NEMESIO, CASTRILLON ([id number]) Notes: ` Orientation to the Wound Care Program Nursing Diagnoses: Knowledge deficit related to the wound healing center program Goals: Patient/caregiver will verbalize understanding of the Wound Healing Center Program Date Initiated: 10/28/2016 Target Resolution Date: 11/12/2016 Goal Status: Active Interventions: Provide education on orientation to the wound center Notes: ` Pain, Acute or Chronic Nursing Diagnoses: Pain, acute or chronic: actual or potential Potential alteration in comfort, pain Goals: Patient/caregiver will verbalize adequate pain control between visits Date Initiated: 10/28/2016 Target Resolution Date: 02/11/2017 Goal Status: Active Interventions: Complete pain assessment as per visit requirements Notes: ` Wound/Skin Impairment Nursing Diagnoses: Impaired tissue integrity Knowledge deficit related to ulceration/compromised skin  integrity Goals: Ulcer/skin breakdown will have a volume reduction of 80% by week 12 Date Initiated: 10/28/2016 Target Resolution Date: 02/04/2017 Goal Status: Active [name], [name] ([id number]) Interventions: Assess patient/caregiver ability to perform ulcer/skin care regimen upon admission and as needed Notes: Electronic Signature(s) Signed: 11/21/2016 2:51:05 PM By: [name] Entered By: [name] on 11/21/2016 14:31:00 Haeberle, [name] ([id number]) -------------------------------------------------------------------------------- Pain Assessment Details Patient Name: [name] Date of Service: 11/21/2016 2:15 PM Medical Record Number: [id number] Patient Account Number: [id number] Date of Birth/Sex: [birth year] (83 y.o. Male) Treating RN: [name] Primary Care Mykael Trott: [name] Other Clinician: Referring Carla Whilden: [name] Treating Krystle Oberman/Extender: [name] in Treatment: 3 Active Problems Location of Pain Severity and Description of Pain Patient Has Paino No Site Locations Pain Management and Medication Current Pain Management: Notes Topical or injectable lidocaine is offered to patient for acute pain when surgical debridement is performed. If needed, Patient is instructed to use over the counter pain medication for the following 24-48 hours after debridement. Wound care MDs do not prescribed pain medications. Patient has chronic pain or uncontrolled pain. Patient has been  instructed to make an appointment with their Primary Care Physician for pain management. Electronic Signature(s) Signed: 11/21/2016 2:51:05 PM By: [name] Entered By: [name] on 11/21/2016 14:17:55 Blazina, [name] ([id number]) -------------------------------------------------------------------------------- Patient/Caregiver Education Details Patient Name: [name] Date of Service: 11/21/2016 2:15 PM Medical  Record Number: [id number] Patient Account Number: [id number] Date of Birth/Gender: 03-Mar-1927 (83 y.o. Male) Treating RN: [name] Primary Care Physician: [name] Other Clinician: Referring Physician: Einar [name] Treating Physician/Extender: [name] in Treatment: 3 Education Assessment Education Provided To: Patient and Caregiver Education Topics Provided Wound/Skin Impairment: Handouts: Other: wound care as ordered Methods: Demonstration, Explain/Verbal Responses: State content correctly Electronic Signature(s) Signed: 11/21/2016 2:51:05 PM By: [name] Entered By: [name] on 11/21/2016 14:48:23 [place], [name] ([id number]) -------------------------------------------------------------------------------- Wound Assessment Details Patient Name: [name] Date of Service: 11/21/2016 2:15 PM Medical Record Number: [id number] Patient Account Number: [id number] Date of Birth/Sex: Feb 12, 1927 (83 y.o. Male) Treating RN: [name] Primary Care Reshanda Lewey: [name] Other Clinician: Referring Labib Cwynar: [name] Treating Dillan Lunden/Extender: [name] in Treatment: 3 Wound Status Wound Number: 1 Primary Trauma, Other Etiology: Wound Location: Left Elbow Wound Open Wounding Event: Trauma Status: Date Acquired: 10/21/2016 Comorbid Anemia, Hypertension, Type II Weeks Of Treatment: 3 History: Diabetes, Osteoarthritis, Neuropathy Clustered Wound: Yes Photos Wound Measurements Length: (cm) 2.3 % Reduction i Width: (cm) 1.4 % Reduction i Depth: (cm) 0.1 Epithelializa Clustered Quantity: 4 Tunneling: Area: (cm) 2.529 Undermining: Volume: (cm) 0.253 n Area: 89.4% n Volume: 89.4% tion: Medium (34-66%) No No Wound Description Classification: Partial Thickness Wound Margin: Distinct, outline attached Exudate Amount: Large Exudate Type: Serosanguineous Exudate Color: red, brown Foul  Odor After Cleansing: No Slough/Fibrino No Wound Bed Granulation Amount: Large (67-100%) Exposed Structure Granulation Quality: Red, Pink, Hyper-granulation Fat Layer (Subcutaneous Tissue) Exposed: Yes Necrotic Amount: None Present (0%) Periwound Skin Texture [place], [name] ([id number]) Texture Color No Abnormalities Noted: No No Abnormalities Noted: No Callus: No Atrophie Blanche: No Crepitus: No Cyanosis: No Excoriation: No Ecchymosis: No Induration: No Erythema: No Rash: No Hemosiderin Staining: No Scarring: Yes Mottled: No Pallor: No Moisture Rubor: No No Abnormalities Noted: No Dry / Scaly: No Temperature / Pain Maceration: No Temperature: No Abnormality Tenderness on Palpation: Yes Wound Preparation Ulcer Cleansing: Rinsed/Irrigated with Saline Topical Anesthetic Applied: Other: lidocaine 4%, Treatment Notes Wound #1 (Left Elbow) 1. Cleansed with: Clean wound with Normal Saline 2. Anesthetic Topical Lidocaine 4% cream to wound bed prior to debridement 4. Dressing Applied: Hydrafera Blue 5. Secondary Dressing Applied ABD and Kerlix/Conform 7. Secured with Tape Notes stretch netting #3 Electronic Signature(s) Signed: 11/21/2016 2:51:05 PM By: [name] Entered By: [name] on 11/21/2016 14:30:50 [name], [name] ([id number]) -------------------------------------------------------------------------------- Vitals Details Patient Name: [name] Date of Service: 11/21/2016 2:15 PM Medical Record Number: [id number] Patient Account Number: [id number] Date of Birth/Sex: 1926-04-03 (83 y.o. Male) Treating RN: [name] Primary Care Alyss Granato: [name] Other Clinician: Referring Lugene Beougher: [name] Treating Paschal Blanton/Extender: [name] in Treatment: 3 Vital Signs Time Taken: 14:17 Temperature (F): 97.8 Height (in): 69 Pulse (bpm): 80 Weight (lbs): 125 Respiratory Rate (breaths/min):  16 Body Mass Index (BMI): 18.5 Blood Pressure (mmHg): 115/56 Reference Range: 80 - 120 mg / dl Electronic Signature(s) Signed: 11/21/2016 2:51:05 PM By: [name] Entered By: [name] on 11/21/2016 14:19:17

## 2016-11-21 NOTE — Progress Notes (Signed)
BUDD, FREIERMUTH ([id number]) Visit Report for 11/21/2016 Chief Complaint Document Details Patient Name: [name], [name] Date of Service: 11/21/2016 2:15 PM Medical Record Number: [id number] Patient Account Number: [id number] Date of Birth/Sex: 15-Jan-1927 (83 y.o. Male) Treating RN: Phillis Haggis Primary Care Provider: Einar Crow Other Clinician: Referring Provider: Einar Crow Treating Provider/Extender: Rudene Re in Treatment: 3 Information Obtained from: Patient Chief Complaint Patient seen for complaints of Non-Healing Wound to the left elbow region and lateral forearm which she's had for a week Electronic Signature(s) Signed: 11/21/2016 3:43:44 PM By: Evlyn Kanner MD, FACS Entered By: Evlyn Kanner on 11/21/2016 15:19:41 [name], [name] ([id number]) -------------------------------------------------------------------------------- HPI Details Patient Name: [name] Date of Service: 11/21/2016 2:15 PM Medical Record Number: [id number] Patient Account Number: [id number] Date of Birth/Sex: Feb 19, 1927 (83 y.o. Male) Treating RN: Phillis Haggis Primary Care Provider: Einar Crow Other Clinician: Referring Provider: Einar Crow Treating Provider/Extender: Rudene Re in Treatment: 3 History of Present Illness Location: left lateral forearm and elbow Quality: Patient reports experiencing a sharp pain to affected area(s). Severity: Patient states wound are getting worse. Duration: Patient has had the wound for < 2 weeks prior to presenting for treatment Timing: Pain in wound is Intermittent (comes and goes Context: The wound occurred when the patient had a syncopal attack and fall Modifying Factors: Other treatment(s) tried include:went to the ER for Steri-Strips and Dermabond Associated Signs and Symptoms: Patient reports having increase discharge. HPI Description: 83 year old patient had a syncopal attack and  a fall on 10/21/2016 and had a injury to his left elbow which was treated in the ER after appropriate x-rays were reviewed. The wound was closed with Steri-Strips and Dermabond and was asked to follow-up at the wound center. past medical history significant for type 2 diabetes mellitus, hyperlipidemia, benign hypertension, stage II chronic kidney disease, gout, prostatic hypertrophy, status post EGD and colonoscope he and right hip fracture repair. he has never been a smoker. Lab work done on 09/20/2016 was reviewed and his hemoglobin A1c was 6.3% 11/03/2016 -- he has been very compliant with his dressing changes and is gotten them done by his granddaughter who has taken excellent care of him Electronic Signature(s) Signed: 11/21/2016 3:43:44 PM By: Evlyn Kanner MD, FACS Entered By: Evlyn Kanner on 11/21/2016 15:19:57 Spadafora, Corinna Gab ([id number]) -------------------------------------------------------------------------------- Gaynelle Adu TISS Details Patient Name: [name] Date of Service: 11/21/2016 2:15 PM Medical Record Number: [id number] Patient Account Number: [id number] Date of Birth/Sex: May 06, 1926 (83 y.o. Male) Treating RN: Phillis Haggis Primary Care Provider: Einar Crow Other Clinician: Referring Provider: Einar Crow Treating Provider/Extender: Rudene Re in Treatment: 3 Procedure Performed for: Wound #1 Left Elbow Performed By: Physician Evlyn Kanner, MD Post Procedure Diagnosis Same as Pre-procedure Notes the left elbow area was treated with silver nitrate stick Electronic Signature(s) Signed: 11/21/2016 3:43:44 PM By: Evlyn Kanner MD, FACS Previous Signature: 11/21/2016 2:51:05 PM Version By: Curtis Sites Entered By: Evlyn Kanner on 11/21/2016 15:19:33 [name] ([id number]) -------------------------------------------------------------------------------- Physical Exam Details Patient Name: [name] Date of Service: 11/21/2016 2:15 PM Medical Record Number: [id number] Patient Account Number: [id number] Date of Birth/Sex: 05/05/1926 (83 y.o. Male) Treating RN: Phillis Haggis Primary Care Provider: Einar Crow Other Clinician: Referring Provider: Einar Crow Treating Provider/Extender: Rudene Re in Treatment: 3 Constitutional . Pulse regular. Respirations normal and unlabored. Afebrile. . Eyes Nonicteric. Reactive to light. Ears, Nose, Mouth, and Throat Lips, teeth, and gums WNL.Marland Kitchen Moist mucosa without lesions. Neck supple and nontender. No  palpable supraclavicular or cervical adenopathy. Normal sized without goiter. Respiratory WNL. No retractions.. Cardiovascular Pedal Pulses WNL. No clubbing, cyanosis or edema. Lymphatic No adneopathy. No adenopathy. No adenopathy. Musculoskeletal Adexa without tenderness or enlargement.. Digits and nails w/o clubbing, cyanosis, infection, petechiae, ischemia, or inflammatory conditions.. Integumentary (Hair, Skin) No suspicious lesions. No crepitus or fluctuance. No peri-wound warmth or erythema. No masses.Marland Kitchen Psychiatric Judgement and insight Intact.. No evidence of depression, anxiety, or agitation.. Notes the wound is looking very good and he has minimal hyper granulation tissue which was chemically cauterized with silver nitrate stick Electronic Signature(s) Signed: 11/21/2016 3:43:44 PM By: Evlyn Kanner MD, FACS Entered By: Evlyn Kanner on 11/21/2016 15:20:26 [name], [name] ([id number]) -------------------------------------------------------------------------------- Physician Orders Details Patient Name: [name] Date of Service: 11/21/2016 2:15 PM Medical Record Number: [id number] Patient Account Number: [id number] Date of Birth/Sex: 09-May-1926 (83 y.o. Male) Treating RN: Curtis Sites Primary Care Provider: Einar Crow Other Clinician: Referring Provider: Einar Crow Treating Provider/Extender: Rudene Re in Treatment: 3 Verbal / Phone Orders: No Diagnosis Coding Wound Cleansing Wound #1 Left Elbow o Clean wound with Normal Saline. o Cleanse wound with mild soap and water o May Shower, gently pat wound dry prior to applying new dressing. Primary Wound Dressing Wound #1 Left Elbow o Hydrafera Blue Secondary Dressing Wound #1 Left Elbow o ABD pad o Conform/Kerlix o Other - stretch netting #4 Dressing Change Frequency Wound #1 Left Elbow o Change dressing every other day. Follow-up Appointments Wound #1 Left Elbow o Return Appointment in 1 week. Additional Orders / Instructions Wound #1 Left Elbow o Increase protein intake. Electronic Signature(s) Signed: 11/21/2016 2:51:05 PM By: Curtis Sites Signed: 11/21/2016 3:43:44 PM By: Evlyn Kanner MD, FACS Entered By: Curtis Sites on 11/21/2016 14:31:38 [name], [name] ([id number]) [name], [name] ([id number]) -------------------------------------------------------------------------------- Problem List Details Patient Name: [name] Date of Service: 11/21/2016 2:15 PM Medical Record Number: [id number] Patient Account Number: [id number] Date of Birth/Sex: 05/23/1926 (83 y.o. Male) Treating RN: Phillis Haggis Primary Care Provider: Einar Crow Other Clinician: Referring Provider: Einar Crow Treating Provider/Extender: Rudene Re in Treatment: 3 Active Problems ICD-10 Encounter Code Description Active Date Diagnosis E11.622 Type 2 diabetes mellitus with other skin ulcer 10/28/2016 Yes S51.012A Laceration without foreign body of left elbow, initial 10/28/2016 Yes encounter S51.002A Unspecified open wound of left elbow, initial encounter 10/28/2016 Yes Inactive Problems Resolved Problems Electronic Signature(s) Signed: 11/21/2016 3:43:44 PM By: Evlyn Kanner MD, FACS Entered By: Evlyn Kanner on 11/21/2016  15:18:44 [name] ([id number]) -------------------------------------------------------------------------------- Progress Note Details Patient Name: [name] Date of Service: 11/21/2016 2:15 PM Medical Record Number: [id number] Patient Account Number: [id number] Date of Birth/Sex: [birth year] (83 y.o. Male) Treating RN: Phillis Haggis Primary Care Provider: Einar Crow Other Clinician: Referring Provider: Einar Crow Treating Provider/Extender: Rudene Re in Treatment: 3 Subjective Chief Complaint Information obtained from Patient Patient seen for complaints of Non-Healing Wound to the left elbow region and lateral forearm which she's had for a week History of Present Illness (HPI) The following HPI elements were documented for the patient's wound: Location: left lateral forearm and elbow Quality: Patient reports experiencing a sharp pain to affected area(s). Severity: Patient states wound are getting worse. Duration: Patient has had the wound for < 2 weeks prior to presenting for treatment Timing: Pain in wound is Intermittent (comes and goes Context: The wound occurred when the patient had a syncopal attack and fall Modifying Factors: Other treatment(s) tried include:went to the ER for Steri-Strips  and Dermabond Associated Signs and Symptoms: Patient reports having increase discharge. 83 year old patient had a syncopal attack and a fall on 10/21/2016 and had a injury to his left elbow which was treated in the ER after appropriate x-rays were reviewed. The wound was closed with Steri-Strips and Dermabond and was asked to follow-up at the wound center. past medical history significant for type 2 diabetes mellitus, hyperlipidemia, benign hypertension, stage II chronic kidney disease, gout, prostatic hypertrophy, status post EGD and colonoscope he and right hip fracture repair. he has never been a smoker. Lab work done on 09/20/2016 was  reviewed and his hemoglobin A1c was 6.3% 11/03/2016 -- he has been very compliant with his dressing changes and is gotten them done by his granddaughter who has taken excellent care of him Objective [name], CAWOOD. ([id number]) Constitutional Pulse regular. Respirations normal and unlabored. Afebrile. Vitals Time Taken: 2:17 PM, Height: 69 in, Weight: 125 lbs, BMI: 18.5, Temperature: 97.8 F, Pulse: 80 bpm, Respiratory Rate: 16 breaths/min, Blood Pressure: 115/56 mmHg. Eyes Nonicteric. Reactive to light. Ears, Nose, Mouth, and Throat Lips, teeth, and gums WNL.Marland Kitchen Moist mucosa without lesions. Neck supple and nontender. No palpable supraclavicular or cervical adenopathy. Normal sized without goiter. Respiratory WNL. No retractions.. Cardiovascular Pedal Pulses WNL. No clubbing, cyanosis or edema. Lymphatic No adneopathy. No adenopathy. No adenopathy. Musculoskeletal Adexa without tenderness or enlargement.. Digits and nails w/o clubbing, cyanosis, infection, petechiae, ischemia, or inflammatory conditions.Marland Kitchen Psychiatric Judgement and insight Intact.. No evidence of depression, anxiety, or agitation.. General Notes: the wound is looking very good and he has minimal hyper granulation tissue which was chemically cauterized with silver nitrate stick Integumentary (Hair, Skin) No suspicious lesions. No crepitus or fluctuance. No peri-wound warmth or erythema. No masses.. Wound #1 status is Open. Original cause of wound was Trauma. The wound is located on the Left Elbow. The wound measures 2.3cm length x 1.4cm width x 0.1cm depth; 2.529cm^2 area and 0.253cm^3 volume. There is Fat Layer (Subcutaneous Tissue) Exposed exposed. There is no tunneling or undermining noted. There is a large amount of serosanguineous drainage noted. The wound margin is distinct with the outline attached to the wound base. There is large (67-100%) red, pink, hyper - granulation within the wound bed. There is no  necrotic tissue within the wound bed. The periwound skin appearance exhibited: Scarring. The periwound skin appearance did not exhibit: Callus, Crepitus, Excoriation, Induration, Rash, Dry/Scaly, Maceration, Atrophie Blanche, Cyanosis, Ecchymosis, Hemosiderin Staining, Mottled, Pallor, Rubor, Erythema. Periwound temperature was noted as No Abnormality. The periwound has tenderness on palpation. [name], [name] ([id number]) Assessment Active Problems ICD-10 E11.622 - Type 2 diabetes mellitus with other skin ulcer S51.012A - Laceration without foreign body of left elbow, initial encounter S51.002A - Unspecified open wound of left elbow, initial encounter Procedures Wound #1 Pre-procedure diagnosis of Wound #1 is a Trauma, Other located on the Left Elbow . An CHEM CAUT GRANULATION TISS procedure was performed by Evlyn Kanner, MD. Post procedure Diagnosis Wound #1: Same as Pre-Procedure Notes: the left elbow area was treated with silver nitrate stick Plan Wound Cleansing: Wound #1 Left Elbow: Clean wound with Normal Saline. Cleanse wound with mild soap and water May Shower, gently pat wound dry prior to applying new dressing. Primary Wound Dressing: Wound #1 Left Elbow: Hydrafera Blue Secondary Dressing: Wound #1 Left Elbow: ABD pad Conform/Kerlix Other - stretch netting #4 Dressing Change Frequency: Wound #1 Left Elbow: Change dressing every other day. Follow-up Appointments: Wound #1 Left Elbow: Return Appointment in 1 week.  [name], [name] ([id number]) Additional Orders / Instructions: Wound #1 Left Elbow: Increase protein intake. after review today, I have recommended: 1. Hydrofera Blue and a Kerlix to be applied every other day 2. May wash with soap and water 3. Continue good care of his diet-controlled diabetes mellitus 4. Regular visits the wound center Electronic Signature(s) Signed: 11/21/2016 3:43:44 PM By: Evlyn Kanner MD, FACS Entered By: Evlyn Kanner on 11/21/2016 15:21:01 [name], [name] ([id number]) -------------------------------------------------------------------------------- SuperBill Details Patient Name: [name] Date of Service: 11/21/2016 Medical Record Number: [id number] Patient Account Number: [id number] Date of Birth/Sex: [birth year] (83 y.o. Male) Treating RN: Phillis Haggis Primary Care Provider: Einar Crow Other Clinician: Referring Provider: Einar Crow Treating Provider/Extender: Rudene Re in Treatment: 3 Diagnosis Coding ICD-10 Codes Code Description [phone] Type 2 diabetes mellitus with other skin ulcer S51.012A Laceration without foreign body of left elbow, initial encounter S51.002A Unspecified open wound of left elbow, initial encounter Facility Procedures CPT4 Code: [id number] Description: 17250 - CHEM CAUT GRANULATION TISS ICD-10 Description Diagnosis E11.622 Type 2 diabetes mellitus with other skin ulcer S51.012A Laceration without foreign body of left elbow, init S51.002A Unspecified open wound of left elbow, initial encou Modifier: ial encounter nter Quantity: 1 Physician Procedures CPT4 Code: [id number] Description: 17250 - WC PHYS CHEM CAUT GRAN TISSUE ICD-10 Description Diagnosis E11.622 Type 2 diabetes mellitus with other skin ulcer S51.012A Laceration without foreign body of left elbow, initi S51.002A Unspecified open wound of left elbow, initial  encoun Modifier: al encounter ter Quantity: 1 Electronic Signature(s) Signed: 11/21/2016 3:43:44 PM By: Evlyn Kanner MD, FACS Entered By: Evlyn Kanner on 11/21/2016 15:21:10

## 2016-12-01 ENCOUNTER — Encounter: Payer: Medicare Other | Admitting: Surgery

## 2016-12-01 DIAGNOSIS — E11622 Type 2 diabetes mellitus with other skin ulcer: Secondary | ICD-10-CM | POA: Diagnosis not present

## 2016-12-03 NOTE — Progress Notes (Signed)
MAXX, PHAM ([id number]) Visit Report for 12/01/2016 Chief Complaint Document Details Patient Name: [name], [name] Date of Service: 12/01/2016 12:30 PM Medical Record Number: [id number] Patient Account Number: [id number] Date of Birth/Sex: 1926/07/17 (83 y.o. Male) Treating RN: Phillis Haggis Primary Care Provider: Einar Crow Other Clinician: Referring Provider: Einar Crow Treating Provider/Extender: Rudene Re in Treatment: 4 Information Obtained from: Patient Chief Complaint Patient seen for complaints of Non-Healing Wound to the left elbow region and lateral forearm which she's had for a week Electronic Signature(s) Signed: 12/01/2016 4:10:10 PM By: Evlyn Kanner MD, FACS Entered By: Evlyn Kanner on 12/01/2016 13:07:50 [name], [name] ([id number]) -------------------------------------------------------------------------------- HPI Details Patient Name: [name] Date of Service: 12/01/2016 12:30 PM Medical Record Number: [id number] Patient Account Number: [id number] Date of Birth/Sex: 05-15-1926 (83 y.o. Male) Treating RN: Phillis Haggis Primary Care Provider: Einar Crow Other Clinician: Referring Provider: Einar Crow Treating Provider/Extender: Rudene Re in Treatment: 4 History of Present Illness Location: left lateral forearm and elbow Quality: Patient reports experiencing a sharp pain to affected area(s). Severity: Patient states wound are getting worse. Duration: Patient has had the wound for < 2 weeks prior to presenting for treatment Timing: Pain in wound is Intermittent (comes and goes Context: The wound occurred when the patient had a syncopal attack and fall Modifying Factors: Other treatment(s) tried include:went to the ER for Steri-Strips and Dermabond Associated Signs and Symptoms: Patient reports having increase discharge. HPI Description: 83 year old patient had a syncopal attack  and a fall on 10/21/2016 and had a injury to his left elbow which was treated in the ER after appropriate x-rays were reviewed. The wound was closed with Steri-Strips and Dermabond and was asked to follow-up at the wound center. past medical history significant for type 2 diabetes mellitus, hyperlipidemia, benign hypertension, stage II chronic kidney disease, gout, prostatic hypertrophy, status post EGD and colonoscope he and right hip fracture repair. he has never been a smoker. Lab work done on 09/20/2016 was reviewed and his hemoglobin A1c was 6.3% 11/03/2016 -- he has been very compliant with his dressing changes and is gotten them done by his granddaughter who has taken excellent care of him Electronic Signature(s) Signed: 12/01/2016 4:10:10 PM By: Evlyn Kanner MD, FACS Entered By: Evlyn Kanner on 12/01/2016 13:07:55 [name], [name] ([id number]) -------------------------------------------------------------------------------- Physical Exam Details Patient Name: [name] Date of Service: 12/01/2016 12:30 PM Medical Record Number: [id number] Patient Account Number: [id number] Date of Birth/Sex: [birth year] (83 y.o. Male) Treating RN: Phillis Haggis Primary Care Provider: Einar Crow Other Clinician: Referring Provider: Einar Crow Treating Provider/Extender: Rudene Re in Treatment: 4 Constitutional . Pulse regular. Respirations normal and unlabored. Afebrile. . Eyes Nonicteric. Reactive to light. Ears, Nose, Mouth, and Throat Lips, teeth, and gums WNL.Marland Kitchen Moist mucosa without lesions. Neck supple and nontender. No palpable supraclavicular or cervical adenopathy. Normal sized without goiter. Respiratory WNL. No retractions.. Cardiovascular Pedal Pulses WNL. No clubbing, cyanosis or edema. Lymphatic No adneopathy. No adenopathy. No adenopathy. Musculoskeletal Adexa without tenderness or enlargement.. Digits and nails w/o clubbing,  cyanosis, infection, petechiae, ischemia, or inflammatory conditions.. Integumentary (Hair, Skin) No suspicious lesions. No crepitus or fluctuance. No peri-wound warmth or erythema. No masses.Marland Kitchen Psychiatric Judgement and insight Intact.. No evidence of depression, anxiety, or agitation.. Notes the wound has healed. Electronic Signature(s) Signed: 12/01/2016 4:10:10 PM By: Evlyn Kanner MD, FACS Entered By: Evlyn Kanner on 12/01/2016 13:08:16 [name], [name] ([id number]) -------------------------------------------------------------------------------- Physician Orders Details Patient Name: [name] Date of Service: 12/01/2016  12:30 PM Medical Record Number: [id number] Patient Account Number: [id number] Date of Birth/Sex: 07-16-1926 (83 y.o. Male) Treating RN: Phillis Haggis Primary Care Provider: Einar Crow Other Clinician: Referring Provider: Einar Crow Treating Provider/Extender: Rudene Re in Treatment: 4 Verbal / Phone Orders: Yes Clinician: Ashok Cordia, Debi Read Back and Verified: Yes Diagnosis Coding Discharge From [facility] Services o Discharge from Wound Care Center - Please keep area clean and dry. Protect area. Please call if you have any questions or concerns. Electronic Signature(s) Signed: 12/01/2016 4:10:10 PM By: Evlyn Kanner MD, FACS Entered By: Evlyn Kanner on 12/01/2016 13:08:24 [name], [name] ([id number]) -------------------------------------------------------------------------------- Problem List Details Patient Name: [name] Date of Service: 12/01/2016 12:30 PM Medical Record Number: [id number] Patient Account Number: [id number] Date of Birth/Sex: 07-Jun-1926 (83 y.o. Male) Treating RN: Phillis Haggis Primary Care Provider: Einar Crow Other Clinician: Referring Provider: Einar Crow Treating Provider/Extender: Rudene Re in Treatment: 4 Active Problems ICD-10 Encounter Code  Description Active Date Diagnosis E11.622 Type 2 diabetes mellitus with other skin ulcer 10/28/2016 Yes S51.012A Laceration without foreign body of left elbow, initial 10/28/2016 Yes encounter S51.002A Unspecified open wound of left elbow, initial encounter 10/28/2016 Yes Inactive Problems Resolved Problems Electronic Signature(s) Signed: 12/01/2016 4:10:10 PM By: Evlyn Kanner MD, FACS Entered By: Evlyn Kanner on 12/01/2016 13:07:03 [name] ([id number]) -------------------------------------------------------------------------------- Progress Note Details Patient Name: [name] Date of Service: 12/01/2016 12:30 PM Medical Record Number: [id number] Patient Account Number: [id number] Date of Birth/Sex: [birth year] (83 y.o. Male) Treating RN: Phillis Haggis Primary Care Provider: Einar Crow Other Clinician: Referring Provider: Einar Crow Treating Provider/Extender: Rudene Re in Treatment: 4 Subjective Chief Complaint Information obtained from Patient Patient seen for complaints of Non-Healing Wound to the left elbow region and lateral forearm which she's had for a week History of Present Illness (HPI) The following HPI elements were documented for the patient's wound: Location: left lateral forearm and elbow Quality: Patient reports experiencing a sharp pain to affected area(s). Severity: Patient states wound are getting worse. Duration: Patient has had the wound for < 2 weeks prior to presenting for treatment Timing: Pain in wound is Intermittent (comes and goes Context: The wound occurred when the patient had a syncopal attack and fall Modifying Factors: Other treatment(s) tried include:went to the ER for Steri-Strips and Dermabond Associated Signs and Symptoms: Patient reports having increase discharge. 83 year old patient had a syncopal attack and a fall on 10/21/2016 and had a injury to his left elbow which was treated in the ER  after appropriate x-rays were reviewed. The wound was closed with Steri-Strips and Dermabond and was asked to follow-up at the wound center. past medical history significant for type 2 diabetes mellitus, hyperlipidemia, benign hypertension, stage II chronic kidney disease, gout, prostatic hypertrophy, status post EGD and colonoscope he and right hip fracture repair. he has never been a smoker. Lab work done on 09/20/2016 was reviewed and his hemoglobin A1c was 6.3% 11/03/2016 -- he has been very compliant with his dressing changes and is gotten them done by his granddaughter who has taken excellent care of him Objective [name], BREIT. ([id number]) Constitutional Pulse regular. Respirations normal and unlabored. Afebrile. Vitals Time Taken: 12:42 PM, Height: 69 in, Weight: 125 lbs, BMI: 18.5, Temperature: 97.8 F, Pulse: 66 bpm, Respiratory Rate: 16 breaths/min, Blood Pressure: 125/72 mmHg. Eyes Nonicteric. Reactive to light. Ears, Nose, Mouth, and Throat Lips, teeth, and gums WNL.Marland Kitchen Moist mucosa without lesions. Neck supple and nontender. No palpable supraclavicular or cervical adenopathy. Normal  sized without goiter. Respiratory WNL. No retractions.. Cardiovascular Pedal Pulses WNL. No clubbing, cyanosis or edema. Lymphatic No adneopathy. No adenopathy. No adenopathy. Musculoskeletal Adexa without tenderness or enlargement.. Digits and nails w/o clubbing, cyanosis, infection, petechiae, ischemia, or inflammatory conditions.Marland Kitchen Psychiatric Judgement and insight Intact.. No evidence of depression, anxiety, or agitation.. General Notes: the wound has healed. Integumentary (Hair, Skin) No suspicious lesions. No crepitus or fluctuance. No peri-wound warmth or erythema. No masses.. Wound #1 status is Open. Original cause of wound was Trauma. The wound is located on the Left Elbow. The wound measures 0cm length x 0cm width x 0cm depth; 0cm^2 area and 0cm^3 volume. There is  Fat Layer (Subcutaneous Tissue) Exposed exposed. There is no tunneling or undermining noted. There is a none present amount of drainage noted. The wound margin is distinct with the outline attached to the wound base. There is no granulation within the wound bed. There is no necrotic tissue within the wound bed. The periwound skin appearance exhibited: Scarring. The periwound skin appearance did not exhibit: Callus, Crepitus, Excoriation, Induration, Rash, Dry/Scaly, Maceration, Atrophie Blanche, Cyanosis, Ecchymosis, Hemosiderin Staining, Mottled, Pallor, Rubor, Erythema. Periwound temperature was noted as No Abnormality. [name], [name] ([id number]) Assessment Active Problems ICD-10 E11.622 - Type 2 diabetes mellitus with other skin ulcer S51.012A - Laceration without foreign body of left elbow, initial encounter S51.002A - Unspecified open wound of left elbow, initial encounter Plan Discharge From [facility] Services: Discharge from Wound Care Center - Please keep area clean and dry. Protect area. Please call if you have any questions or concerns. the wound is healed and though he is not keen on protecting this I have spoken to his caregiver to apply a bordered foam over the next several weeks to prevent it from getting the supple scar injured again. He is discharged from the wound care services. Electronic Signature(s) Signed: 12/01/2016 4:10:10 PM By: Evlyn Kanner MD, FACS Entered By: Evlyn Kanner on 12/01/2016 13:09:27 [name], [name] ([id number]) -------------------------------------------------------------------------------- SuperBill Details Patient Name: [name] Date of Service: 12/01/2016 Medical Record Number: [id number] Patient Account Number: [id number] Date of Birth/Sex: 1926/07/29 (83 y.o. Male) Treating RN: Phillis Haggis Primary Care Provider: Einar Crow Other Clinician: Referring Provider: Einar Crow Treating Provider/Extender:  Rudene Re in Treatment: 4 Diagnosis Coding ICD-10 Codes Code Description [phone] Type 2 diabetes mellitus with other skin ulcer S51.012A Laceration without foreign body of left elbow, initial encounter S51.002A Unspecified open wound of left elbow, initial encounter Facility Procedures CPT4 Code: [id number] Description: [id number] - WOUND CARE VISIT-LEV 2 EST PT Modifier: Quantity: 1 Physician Procedures CPT4 Code: 4034742 Description: 59563 - WC PHYS LEVEL 2 - EST PT ICD-10 Description Diagnosis E11.622 Type 2 diabetes mellitus with other skin ulcer S51.012A Laceration without foreign body of left elbow, ini S51.002A Unspecified open wound of left elbow, initial enco Modifier: tial encounter unter Quantity: 1 Electronic Signature(s) Signed: 12/01/2016 4:10:10 PM By: Evlyn Kanner MD, FACS Signed: 12/01/2016 4:27:23 PM By: Alejandro Mulling Entered By: Alejandro Mulling on 12/01/2016 13:15:01

## 2016-12-05 NOTE — Progress Notes (Signed)
SAMIEL, PEEL ([id number]) Visit Report for 12/01/2016 Arrival Information Details Patient Name: [name], [name] Date of Service: 12/01/2016 12:30 PM Medical Record Number: [id number] Patient Account Number: [id number] Date of Birth/Sex: 1926-08-14 (83 y.o. Male) Treating RN: Phillis Haggis Primary Care Estell Puccini: Einar Crow Other Clinician: Referring Tanice Petre: Einar Crow Treating Jaivion Kingsley/Extender: Rudene Re in Treatment: 4 Visit Information History Since Last Visit All ordered tests and consults were completed: No Patient Arrived: Wheel Chair Added or deleted any medications: No Arrival Time: 12:38 Any new allergies or adverse reactions: No Accompanied By: daughter Had a fall or experienced change in No activities of daily living that may affect Transfer Assistance: None risk of falls: Patient Identification Verified: Yes Signs or symptoms of abuse/neglect since last No Secondary Verification Process Yes visito Completed: Hospitalized since last visit: No Patient Requires Transmission-Based No Has Dressing in Place as Prescribed: Yes Precautions: Pain Present Now: No Patient Has Alerts: Yes Patient Alerts: DM II Electronic Signature(s) Signed: 12/01/2016 4:27:23 PM By: Alejandro Mulling Entered By: Alejandro Mulling on 12/01/2016 12:39:21 President, Corinna Gab ([id number]) -------------------------------------------------------------------------------- Clinic Level of Care Assessment Details Patient Name: [name] Date of Service: 12/01/2016 12:30 PM Medical Record Number: [id number] Patient Account Number: [id number] Date of Birth/Sex: 1926/05/02 (83 y.o. Male) Treating RN: Phillis Haggis Primary Care Juliett Eastburn: Einar Crow Other Clinician: Referring Sherrill Mckamie: Einar Crow Treating Tiffanye Hartmann/Extender: Rudene Re in Treatment: 4 Clinic Level of Care Assessment Items TOOL 4 Quantity Score X - Use when  only an EandM is performed on FOLLOW-UP visit 1 0 ASSESSMENTS - Nursing Assessment / Reassessment X - Reassessment of Co-morbidities (includes updates in patient status) 1 10 X - Reassessment of Adherence to Treatment Plan 1 5 ASSESSMENTS - Wound and Skin Assessment / Reassessment X - Simple Wound Assessment / Reassessment - one wound 1 5  - Complex Wound Assessment / Reassessment - multiple wounds 0  - Dermatologic / Skin Assessment (not related to wound area) 0 ASSESSMENTS - Focused Assessment  - Circumferential Edema Measurements - multi extremities 0  - Nutritional Assessment / Counseling / Intervention 0  - Lower Extremity Assessment (monofilament, tuning fork, pulses) 0  - Peripheral Arterial Disease Assessment (using hand held doppler) 0 ASSESSMENTS - Ostomy and/or Continence Assessment and Care  - Incontinence Assessment and Management 0  - Ostomy Care Assessment and Management (repouching, etc.) 0 PROCESS - Coordination of Care X - Simple Patient / Family Education for ongoing care 1 15  - Complex (extensive) Patient / Family Education for ongoing care 0  - Staff obtains Chiropractor, Records, Test Results / Process Orders 0  - Staff telephones HHA, Nursing Homes / Clarify orders / etc 0  - Routine Transfer to another Facility (non-emergent condition) 0 Chou, CLARE CASTO ([id number])  - Routine Hospital Admission (non-emergent condition) 0  - New Admissions / Manufacturing engineer / Ordering NPWT, Apligraf, etc. 0  - Emergency Hospital Admission (emergent condition) 0 X - Simple Discharge Coordination 1 10  - Complex (extensive) Discharge Coordination 0 PROCESS - Special Needs  - Pediatric / Minor Patient Management 0  - Isolation Patient Management 0  - Hearing / Language / Visual special needs 0  - Assessment of Community assistance (transportation, D/C planning, etc.) 0  - Additional assistance / Altered mentation 0  -  Support Surface(s) Assessment (bed, cushion, seat, etc.) 0 INTERVENTIONS - Wound Cleansing / Measurement X - Simple Wound Cleansing - one wound 1 5  - Complex Wound Cleansing - multiple wounds 0 X -  Wound Imaging (photographs - any number of wounds) 1 5  - Wound Tracing (instead of photographs) 0  - Simple Wound Measurement - one wound 0  - Complex Wound Measurement - multiple wounds 0 INTERVENTIONS - Wound Dressings  - Small Wound Dressing one or multiple wounds 0  - Medium Wound Dressing one or multiple wounds 0  - Large Wound Dressing one or multiple wounds 0  - Application of Medications - topical 0  - Application of Medications - injection 0 INTERVENTIONS - Miscellaneous  - External ear exam 0 Vath, Jeet J. ([id number])  - Specimen Collection (cultures, biopsies, blood, body fluids, etc.) 0  - Specimen(s) / Culture(s) sent or taken to Lab for analysis 0  - Patient Transfer (multiple staff / Michiel Sites Lift / Similar devices) 0  - Simple Staple / Suture removal (25 or less) 0  - Complex Staple / Suture removal (26 or more) 0  - Hypo / Hyperglycemic Management (close monitor of Blood Glucose) 0  - Ankle / Brachial Index (ABI) - do not check if billed separately 0 X - Vital Signs 1 5 Has the patient been seen at the hospital within the last three years: Yes Total Score: 60 Level Of Care: New/Established - Level 2 Electronic Signature(s) Signed: 12/01/2016 4:27:23 PM By: Alejandro Mulling Entered By: Alejandro Mulling on 12/01/2016 13:14:50 Demeyer, Corinna Gab ([id number]) -------------------------------------------------------------------------------- Encounter Discharge Information Details Patient Name: [name] Date of Service: 12/01/2016 12:30 PM Medical Record Number: [id number] Patient Account Number: [id number] Date of Birth/Sex: 09/20/1926 (83 y.o. Male) Treating RN: Phillis Haggis Primary Care Jonerik Sliker: Einar Crow Other Clinician: Referring Myla Mauriello: Einar Crow Treating Nora Sabey/Extender: Rudene Re in Treatment: 4 Encounter Discharge Information Items Discharge Pain Level: 0 Discharge Condition: Stable Ambulatory Status: Wheelchair Discharge Destination: Home Transportation: Private Auto Accompanied By: daughter Schedule Follow-up Appointment: No Medication Reconciliation completed No and provided to Patient/Care Sharin Altidor: Patient Clinical Summary of Care: Declined Electronic Signature(s) Signed: 12/05/2016 9:00:05 AM By: Gwenlyn Perking Entered By: Gwenlyn Perking on 12/01/2016 12:54:40 Brindisi, Corinna Gab ([id number]) -------------------------------------------------------------------------------- Lower Extremity Assessment Details Patient Name: [name] Date of Service: 12/01/2016 12:30 PM Medical Record Number: [id number] Patient Account Number: [id number] Date of Birth/Sex: 1926-06-14 (83 y.o. Male) Treating RN: Phillis Haggis Primary Care Eshaal Duby: Einar Crow Other Clinician: Referring Dimitri Shakespeare: Einar Crow Treating Mulan Adan/Extender: Rudene Re in Treatment: 4 Electronic Signature(s) Signed: 12/01/2016 4:27:23 PM By: Alejandro Mulling Entered By: Alejandro Mulling on 12/01/2016 12:45:26 Hovis, Corinna Gab ([id number]) -------------------------------------------------------------------------------- Multi Wound Chart Details Patient Name: [name] Date of Service: 12/01/2016 12:30 PM Medical Record Number: [id number] Patient Account Number: [id number] Date of Birth/Sex: [birth year] (83 y.o. Male) Treating RN: Phillis Haggis Primary Care Atharv Barriere: Einar Crow Other Clinician: Referring Amreen Raczkowski: Einar Crow Treating Elisandra Deshmukh/Extender: Rudene Re in Treatment: 4 Vital Signs Height(in): 69 Pulse(bpm): 66 Weight(lbs): 125 Blood Pressure 125/72 (mmHg): Body Mass Index(BMI):  18 Temperature(F): 97.8 Respiratory Rate 16 (breaths/min): Photos: [N/A:N/A] Wound Location: Left Elbow N/A N/A Wounding Event: Trauma N/A N/A Primary Etiology: Trauma, Other N/A N/A Comorbid History: Anemia, Hypertension, N/A N/A Type II Diabetes, Osteoarthritis, Neuropathy Date Acquired: 10/21/2016 N/A N/A Weeks of Treatment: 4 N/A N/A Wound Status: Open N/A N/A Clustered Wound: Yes N/A N/A Clustered Quantity: 4 N/A N/A Measurements L x W x D 0x0x0 N/A N/A (cm) Area (cm) : 0 N/A N/A Volume (cm) : 0 N/A N/A % Reduction in Area: 100.00% N/A N/A % Reduction in Volume: 100.00% N/A N/A Classification: Partial Thickness N/A N/A  Exudate Amount: None Present N/A N/A Wound Margin: Distinct, outline attached N/A N/A Granulation Amount: None Present (0%) N/A N/A Necrotic Amount: None Present (0%) N/A N/A Exposed Structures: N/A N/A RAINEN, VANROSSUM. ([id number]) Fat Layer (Subcutaneous Tissue) Exposed: Yes Epithelialization: Large (67-100%) N/A N/A Periwound Skin Texture: Scarring: Yes N/A N/A Excoriation: No Induration: No Callus: No Crepitus: No Rash: No Periwound Skin Maceration: No N/A N/A Moisture: Dry/Scaly: No Periwound Skin Color: Atrophie Blanche: No N/A N/A Cyanosis: No Ecchymosis: No Erythema: No Hemosiderin Staining: No Mottled: No Pallor: No Rubor: No Temperature: No Abnormality N/A N/A Tenderness on No N/A N/A Palpation: Wound Preparation: Ulcer Cleansing: N/A N/A Rinsed/Irrigated with Saline Topical Anesthetic Applied: None Treatment Notes Electronic Signature(s) Signed: 12/01/2016 4:10:10 PM By: Evlyn Kanner MD, FACS Entered By: Evlyn Kanner on 12/01/2016 13:07:44 Ground, Corinna Gab ([id number]) -------------------------------------------------------------------------------- Multi-Disciplinary Care Plan Details Patient Name: [name] Date of Service: 12/01/2016 12:30 PM Medical Record Number: [id number] Patient Account  Number: [id number] Date of Birth/Sex: December 11, 1926 (83 y.o. Male) Treating RN: Phillis Haggis Primary Care Breslyn Abdo: Einar Crow Other Clinician: Referring Neytiri Asche: Einar Crow Treating Arvid Marengo/Extender: Rudene Re in Treatment: 4 Active Inactive Electronic Signature(s) Signed: 12/01/2016 4:27:23 PM By: Alejandro Mulling Entered By: Alejandro Mulling on 12/01/2016 12:48:34 Lassalle, Corinna Gab ([id number]) -------------------------------------------------------------------------------- Pain Assessment Details Patient Name: [name] Date of Service: 12/01/2016 12:30 PM Medical Record Number: [id number] Patient Account Number: [id number] Date of Birth/Sex: 04-30-1926 (83 y.o. Male) Treating RN: Phillis Haggis Primary Care Yessica Putnam: Einar Crow Other Clinician: Referring Borna Wessinger: Einar Crow Treating Jasline Buskirk/Extender: Rudene Re in Treatment: 4 Active Problems Location of Pain Severity and Description of Pain Patient Has Paino No Site Locations Pain Management and Medication Current Pain Management: Electronic Signature(s) Signed: 12/01/2016 4:27:23 PM By: Alejandro Mulling Entered By: Alejandro Mulling on 12/01/2016 12:39:28 [name] ([id number]) -------------------------------------------------------------------------------- Patient/Caregiver Education Details Patient Name: [name] Date of Service: 12/01/2016 12:30 PM Medical Record Number: [id number] Patient Account Number: [id number] Date of Birth/Gender: [birth year] (83 y.o. Male) Treating RN: Phillis Haggis Primary Care Physician: Einar Crow Other Clinician: Referring Physician: Einar Crow Treating Physician/Extender: Rudene Re in Treatment: 4 Education Assessment Education Provided To: Patient Education Topics Provided Wound/Skin Impairment: Handouts: Other: Please call if you have any questions or  concerns. Methods: Explain/Verbal Responses: State content correctly Electronic Signature(s) Signed: 12/01/2016 4:27:23 PM By: Alejandro Mulling Entered By: Alejandro Mulling on 12/01/2016 12:49:56 Hage, Corinna Gab ([id number]) -------------------------------------------------------------------------------- Wound Assessment Details Patient Name: [name] Date of Service: 12/01/2016 12:30 PM Medical Record Number: [id number] Patient Account Number: [id number] Date of Birth/Sex: [birth year] (83 y.o. Male) Treating RN: Phillis Haggis Primary Care Sofya Moustafa: Einar Crow Other Clinician: Referring Miata Culbreth: Einar Crow Treating Nashira Mcglynn/Extender: Rudene Re in Treatment: 4 Wound Status Wound Number: 1 Primary Trauma, Other Etiology: Wound Location: Left Elbow Wound Open Wounding Event: Trauma Status: Date Acquired: 10/21/2016 Comorbid Anemia, Hypertension, Type II Weeks Of Treatment: 4 History: Diabetes, Osteoarthritis, Neuropathy Clustered Wound: Yes Photos Photo Uploaded By: Alejandro Mulling on 12/01/2016 12:59:20 Wound Measurements Length: (cm) 0 % Reductio Width: (cm) 0 % Reductio Depth: (cm) 0 Epithelial Clustered Quantity: 4 Tunneling: Area: (cm) 0 Undermini Volume: (cm) 0 n in Area: 100% n in Volume: 100% ization: Large (67-100%) No ng: No Wound Description Classification: Partial Thickness Wound Margin: Distinct, outline attached Exudate Amount: None Present Foul Odor After Cleansing: No Slough/Fibrino No Wound Bed Granulation Amount: None Present (0%) Exposed Structure Necrotic Amount: None Present (0%) Fat Layer (Subcutaneous Tissue) Exposed: Yes  Periwound Skin Texture Texture Color No Abnormalities Noted: No No Abnormalities Noted: No Codispoti, ACESON LABELL. ([id number]) Callus: No Atrophie Blanche: No Crepitus: No Cyanosis: No Excoriation: No Ecchymosis: No Induration: No Erythema: No Rash: No Hemosiderin  Staining: No Scarring: Yes Mottled: No Pallor: No Moisture Rubor: No No Abnormalities Noted: No Dry / Scaly: No Temperature / Pain Maceration: No Temperature: No Abnormality Wound Preparation Ulcer Cleansing: Rinsed/Irrigated with Saline Topical Anesthetic Applied: None Electronic Signature(s) Signed: 12/01/2016 4:27:23 PM By: Alejandro Mulling Entered By: Alejandro Mulling on 12/01/2016 12:47:56 Giovanetti, Corinna Gab ([id number]) -------------------------------------------------------------------------------- Vitals Details Patient Name: [name] Date of Service: 12/01/2016 12:30 PM Medical Record Number: [id number] Patient Account Number: [id number] Date of Birth/Sex: 29-Jul-1926 (83 y.o. Male) Treating RN: Phillis Haggis Primary Care Cathaleen Korol: Einar Crow Other Clinician: Referring Xiomara Sevillano: Einar Crow Treating Mathew Postiglione/Extender: Rudene Re in Treatment: 4 Vital Signs Time Taken: 12:42 Temperature (F): 97.8 Height (in): 69 Pulse (bpm): 66 Weight (lbs): 125 Respiratory Rate (breaths/min): 16 Body Mass Index (BMI): 18.5 Blood Pressure (mmHg): 125/72 Reference Range: 80 - 120 mg / dl Electronic Signature(s) Signed: 12/01/2016 4:27:23 PM By: Alejandro Mulling Entered By: Alejandro Mulling on 12/01/2016 12:43:45

## 2017-01-05 ENCOUNTER — Ambulatory Visit (INDEPENDENT_AMBULATORY_CARE_PROVIDER_SITE_OTHER): Payer: Medicare Other | Admitting: Podiatry

## 2017-01-05 ENCOUNTER — Encounter: Payer: Self-pay | Admitting: Podiatry

## 2017-01-05 VITALS — BP 128/81 | HR 79

## 2017-01-05 DIAGNOSIS — M79674 Pain in right toe(s): Secondary | ICD-10-CM | POA: Diagnosis not present

## 2017-01-05 DIAGNOSIS — M79675 Pain in left toe(s): Secondary | ICD-10-CM | POA: Diagnosis not present

## 2017-01-05 DIAGNOSIS — B351 Tinea unguium: Secondary | ICD-10-CM | POA: Diagnosis not present

## 2017-01-05 NOTE — Progress Notes (Signed)
   Subjective:    Patient ID: Christian LarocheClarence J Nath, male    DOB: 10/25/1926, 83 y.o.   MRN: [id number]  HPI this patient presents the office with chief complaint of long painful nails.  These nails are painful walking and wearing his shoes.  Patient states he is unable to self treat.  He presents the office today for preventative foot care services  Review of Systems  Cardiovascular: Positive for leg swelling.  Hematological: Bruises/bleeds easily.       Objective:   Physical Exam General Appearance  Alert, conversant and in no acute stress.  Vascular  Dorsalis pedis and posterior pulses are not  palpable  bilaterally.  Capillary return is within normal limits  Bilaterally. Temperature is within normal limits  Bilaterally  Neurologic  Senn-Weinstein monofilament wire test within normal limits  bilaterally. Muscle power  Within normal limits bilaterally.  Nails Thick disfigured discolored nails with subungual debris  from hallux to fifth toes left and 2-5 right.. No evidence of bacterial infection or drainage bilaterally.  Orthopedic  No limitations of motion of motion feet bilaterally.  No crepitus or effusions noted.  No bony pathology or digital deformities noted.  Skin  normotropic skin with no porokeratosis noted bilaterally.  No signs of infections or ulcers noted.          Assessment & Plan:  Onychomycosis  B/L    IE  Debridement of nails  X 10.  RTC 3 months   Helane GuntherGregory Lorilyn Laitinen DPM

## 2017-02-07 ENCOUNTER — Other Ambulatory Visit: Payer: Self-pay

## 2017-02-14 NOTE — Progress Notes (Signed)
02/15/2017 9:42 AM   [name] 11/26/1926 [id number]  Referring provider: [name], [name], MD [address] [facility] [place], [place] [id number]  Chief Complaint  Patient presents with  . Urinary Incontinence    HPI: Patient is an 83 year old Caucasian male who is referred by Dr. Marya AmslerMarshall [name]. Anderson for night time incontinence who presents with his granddaughter, [name].    Patient has dementia and is a poor historian.  His granddaughter states recently he has noted frequency, urgency, dysuria, nocturia, hesitancy blood in the urine and weak urinary stream.  He has not had suprapubic pain or flank pain.  He has not had any fevers, chills, nausea or vomiting.    His PVR is > 999 mL.  His UA was unremarkable.  Indwelling Foley placed without difficulty.  1400 cc of urine obtained.      PMH: History reviewed. No pertinent past medical history.  Surgical History: Past Surgical History:  Procedure Laterality Date  . KNEE SURGERY      Home Medications:  Allergies as of 02/15/2017   No Known Allergies     Medication List        Accurate as of 02/15/17  9:42 AM. Always use your most recent med list.          atorvastatin 40 MG tablet Commonly known as:  LIPITOR Take 20 mg by mouth daily.   finasteride 5 MG tablet Commonly known as:  PROSCAR Take 1 tablet (5 mg total) by mouth daily.   furosemide 20 MG tablet Commonly known as:  LASIX Take by mouth.   INTEGRA 62.5-62.5-40-3 MG Caps Take 1 capsule by mouth daily.   latanoprost 0.005 % ophthalmic solution Commonly known as:  XALATAN Place 1 drop into both eyes at bedtime.   lisinopril-hydrochlorothiazide 20-12.5 MG tablet Commonly known as:  PRINZIDE,ZESTORETIC Take 1 tablet by mouth daily.   metFORMIN 500 MG 24 hr tablet Commonly known as:  GLUCOPHAGE-XR Take by mouth.   omeprazole 40 MG capsule Commonly known as:  PRILOSEC Take 40 mg by mouth daily.     POLYSACCHARIDE IRON COMPLEX PO Take by mouth.   potassium chloride 10 MEQ tablet Commonly known as:  K-DUR Take by mouth.   tamsulosin 0.4 MG Caps capsule Commonly known as:  FLOMAX Take 1 capsule (0.4 mg total) by mouth daily.       Allergies: No Known Allergies  Family History: History reviewed. No pertinent family history.  Social History:  reports that  has never smoked. he has never used smokeless tobacco. He reports that he does not drink alcohol or use drugs.  ROS: UROLOGY Frequent Urination?: Yes Hard to postpone urination?: Yes Burning/pain with urination?: Yes Get up at night to urinate?: Yes Leakage of urine?: Yes Urine stream starts and stops?: Yes Trouble starting stream?: Yes Do you have to strain to urinate?: Yes Blood in urine?: Yes Urinary tract infection?: No Sexually transmitted disease?: No Injury to kidneys or bladder?: No Painful intercourse?: No Weak stream?: Yes Erection problems?: No Penile pain?: No  Gastrointestinal Nausea?: No Vomiting?: No Indigestion/heartburn?: Yes Diarrhea?: No Constipation?: No  Constitutional Fever: No Night sweats?: No Weight loss?: No Fatigue?: No  Skin Skin rash/lesions?: Yes Itching?: No  Eyes Blurred vision?: No Double vision?: No  Ears/Nose/Throat Sore throat?: No Sinus problems?: No  Hematologic/Lymphatic Swollen glands?: No Easy bruising?: Yes  Cardiovascular Leg swelling?: Yes Chest pain?: No  Respiratory Cough?: No Shortness of breath?: No  Endocrine Excessive thirst?: No  Musculoskeletal Back pain?: No Joint pain?: Yes  Neurological Headaches?: No Dizziness?: No  Psychologic Depression?: No Anxiety?: No  Physical Exam: BP 107/62 (BP Location: Left Arm, Patient Position: Sitting, Cuff Size: Normal)   Pulse 89   Ht 5\' 9"  (1.753 m)   Wt 116 lb (52.6 kg)   BMI 17.13 kg/m   Constitutional: Well nourished. Alert and oriented, No acute distress. HEENT: [name],  moist mucus membranes. Trachea midline, no masses. Cardiovascular: No clubbing, cyanosis, or edema. Respiratory: Normal respiratory effort, no increased work of breathing. GI: Abdomen is soft, non tender, non distended, no abdominal masses. Liver and spleen not palpable.  No hernias appreciated.  Stool sample for occult testing is not indicated.   GU: No CVA tenderness.  Bladder is palpable to just below the umbilicus.  No bladder fullness or masses.  Patient with circumcised phallus.   Urethral meatus is patent.  No penile discharge. No penile lesions or rashes. Scrotum without lesions, cysts, rashes and/or edema.  Testicles are located scrotally bilaterally. No masses are appreciated in the testicles. Left and right epididymis are normal. Rectal: Patient with  normal sphincter tone. Anus and perineum without scarring or rashes. No rectal masses are appreciated. Prostate is approximately 100 + grams, could not palpated the entire gland due to its size.  Seminal vesicles are normal. Skin: No rashes, bruises or suspicious lesions. Lymph: No cervical or inguinal adenopathy. Neurologic: Grossly intact, no focal deficits, moving all 4 extremities. Psychiatric: Normal mood and affect.  Laboratory Data: Lab Results  Component Value Date   WBC 7.2 10/21/2016   HGB 10.8 (L) 10/21/2016   HCT 31.0 (L) 10/21/2016   MCV 89.5 10/21/2016   PLT 221 10/21/2016    Lab Results  Component Value Date   CREATININE 0.85 10/21/2016    Lab Results  Component Value Date   AST 30 10/21/2016   Lab Results  Component Value Date   ALT 20 10/21/2016    Urinalysis Negative.  See Epic.   I have reviewed the labs.   Pertinent Imaging: Results for [name], [name] (MRN [id number]) as of 02/15/2017 09:33  Ref. Range 02/15/2017 09:18  Scan Result Unknown >999   I have independently reviewed the films.    Assessment & Plan:    1. Urinary retention  - indwelling Foley is placed today - explained to  the granddaughter that it would not be surprising to see blood in the urine during the next few days and to encourage the patient to drink Gatorade   - TOV in one week - if fails keep Foley in place until one month follow up  - RTC in one month for PVR  2. Overflow incontinence  - caused by urinary retention  3. Massive BPH  - explained to the granddaughter that he is not a surgical candidate due to his co-morbidities  - explained to the granddaughter that we can start medications (tamsulosin and finasteride) but I am not hopeful they will be effective as his prostate size is quite large - scripts sent to pharmacy  - explained that we may be looking out permanent indwelling Foley vs CIC vs SPT - will discuss further after his TOV in one week  Return in about 1 week (around 02/22/2017) for TOV in the am on nurses schedule.  These notes generated with voice recognition software. I apologize for typographical errors.  Michiel CowboySHANNON Carmelia Tiner, PA-C  [facility] [address], [address] [place],  [place] [phone]

## 2017-02-15 ENCOUNTER — Ambulatory Visit: Payer: Medicare Other | Admitting: Urology

## 2017-02-15 ENCOUNTER — Encounter: Payer: Self-pay | Admitting: Urology

## 2017-02-15 VITALS — BP 107/62 | HR 89 | Ht 69.0 in | Wt 116.0 lb

## 2017-02-15 DIAGNOSIS — N138 Other obstructive and reflux uropathy: Secondary | ICD-10-CM | POA: Diagnosis not present

## 2017-02-15 DIAGNOSIS — N401 Enlarged prostate with lower urinary tract symptoms: Secondary | ICD-10-CM | POA: Diagnosis not present

## 2017-02-15 DIAGNOSIS — N39490 Overflow incontinence: Secondary | ICD-10-CM

## 2017-02-15 DIAGNOSIS — R339 Retention of urine, unspecified: Secondary | ICD-10-CM | POA: Diagnosis not present

## 2017-02-15 LAB — URINALYSIS, COMPLETE
BILIRUBIN UA: NEGATIVE
Glucose, UA: NEGATIVE
Ketones, UA: NEGATIVE
LEUKOCYTES UA: NEGATIVE
Nitrite, UA: NEGATIVE
PH UA: 5.5 (ref 5.0–7.5)
PROTEIN UA: NEGATIVE
RBC UA: NEGATIVE
Specific Gravity, UA: 1.02 (ref 1.005–1.030)
Urobilinogen, Ur: 0.2 mg/dL (ref 0.2–1.0)

## 2017-02-15 LAB — MICROSCOPIC EXAMINATION
EPITHELIAL CELLS (NON RENAL): NONE SEEN /HPF (ref 0–10)
WBC, UA: NONE SEEN /hpf (ref 0–?)

## 2017-02-15 LAB — BLADDER SCAN AMB NON-IMAGING: Scan Result: >999

## 2017-02-15 MED ORDER — FINASTERIDE 5 MG PO TABS: 5.0000 mg | ORAL_TABLET | Freq: Every day | ORAL | 3 refills | Status: AC

## 2017-02-15 MED ORDER — TAMSULOSIN HCL 0.4 MG PO CAPS: 0.4000 mg | ORAL_CAPSULE | Freq: Every day | ORAL | 3 refills | Status: DC

## 2017-02-15 NOTE — Progress Notes (Signed)
Simple Catheter Placement  Due to urinary retention patient is present today for a foley cath placement.  Patient was cleaned and prepped in a sterile fashion with betadine and lidocaine jelly 2% was instilled into the urethra.  A 16 coude foley catheter was inserted, urine return was noted  1400ml, urine was dark yellow in color.  The balloon was filled with 10cc of sterile water.  A leg bag was attached for drainage. Patient was also given a night bag to take home and was given instruction on how to change from one bag to another.  Patient was given instruction on proper catheter care.  Patient tolerated well, no complications were noted   Preformed by: Teressa Lowerarrie Kathryn Linarez, CMA  Additional notes/ Follow up: 1 week nurse visit Voiding trial

## 2017-02-22 ENCOUNTER — Ambulatory Visit (INDEPENDENT_AMBULATORY_CARE_PROVIDER_SITE_OTHER): Payer: Medicare Other

## 2017-02-22 VITALS — BP 105/58 | HR 72 | Ht 71.0 in | Wt 116.0 lb

## 2017-02-22 DIAGNOSIS — R339 Retention of urine, unspecified: Secondary | ICD-10-CM | POA: Diagnosis not present

## 2017-02-22 NOTE — Progress Notes (Signed)
Pt presented today for a voiding trial. Foley was removed without difficulty. Pt will RTC today by 3pm if not able to urinate.   Blood pressure (!) 105/58, pulse 72, height 5\' 11"  (1.803 m), weight 116 lb (52.6 kg).

## 2017-02-22 NOTE — Progress Notes (Signed)
Bladder Scan Patient cannot void: 450 ml Performed By: Eligha BridegroomSarah Lesean Woolverton, CMA  Simple Catheter Placement  Due to urinary retention patient is present today for a foley cath placement.  Patient was cleaned and prepped in a sterile fashion with betadine and lidocaine jelly 2% was instilled into the urethra.  A 16 coude FR foley catheter was inserted, urine return was noted  500ml, urine was yellow in color.  The balloon was filled with 10cc of sterile water.  A leg bag was attached for drainage. Patient was also given a night bag to take home and was given instruction on how to change from one bag to another.  Patient was given instruction on proper catheter care.  Patient tolerated well, no complications were noted   Preformed by: Eligha BridegroomSarah Bryli Mantey, CMA  Additional notes/ Follow up: per Michiel CowboyShannon McGowan, PAC follow up in 2-3wks with her to discuss other options

## 2017-03-15 NOTE — Progress Notes (Signed)
03/16/2017 10:08 AM   [name] 08/20/1926 [id number]  Referring provider: [name], [name], MD [address] [facility] [place], [place] [id number]  Chief Complaint  Patient presents with  . Follow-up  . Urinary Retention    HPI: 84  Yo WM with urinary retention who present today for a one month follow up.  Background history Patient is an 84 year old Caucasian male who is referred by Dr. Marya AmslerMarshall [name]. [name] for night time incontinence who presents with his granddaughter, [name].  Patient has dementia and is a poor historian.  His granddaughter states recently he has noted frequency, urgency, dysuria, nocturia, hesitancy blood in the urine and weak urinary stream.  He has not had suprapubic pain or flank pain.  He has not had any fevers, chills, nausea or vomiting.  His PVR is > 999 mL.  His UA was unremarkable.  Indwelling Foley placed without difficulty.  1400 cc of urine obtained.   Tamsulosin and finasteride were started.  He was then scheduled for a TOV.    Foley was removed on 02/22/2017 for a TOV.  He failed and a Foley was replaced with a 500 cc PVR.    Today, he states he wants to have a TOV again.  I did explain my reservations regarding his chance of success.  He has been taking his finasteride and tamsulosin daily.  He has not had fevers, chills, nausea or vomiting.  He has not had gross hematuria or suprapubic pain.   PMH: No past medical history on file.  Surgical History: Past Surgical History:  Procedure Laterality Date  . KNEE SURGERY      Home Medications:  Allergies as of 03/16/2017   No Known Allergies     Medication List        Accurate as of 03/16/17 10:08 AM. Always use your most recent med list.          atorvastatin 40 MG tablet Commonly known as:  LIPITOR Take 20 mg by mouth daily.   finasteride 5 MG tablet Commonly known as:  PROSCAR Take 1 tablet (5 mg total) by mouth daily.   furosemide 20 MG  tablet Commonly known as:  LASIX Take by mouth.   INTEGRA 62.5-62.5-40-3 MG Caps Take 1 capsule by mouth daily.   latanoprost 0.005 % ophthalmic solution Commonly known as:  XALATAN Place 1 drop into both eyes at bedtime.   lisinopril-hydrochlorothiazide 20-12.5 MG tablet Commonly known as:  PRINZIDE,ZESTORETIC Take 1 tablet by mouth daily.   metFORMIN 500 MG 24 hr tablet Commonly known as:  GLUCOPHAGE-XR Take by mouth.   omeprazole 40 MG capsule Commonly known as:  PRILOSEC Take 40 mg by mouth daily.   POLYSACCHARIDE IRON COMPLEX PO Take by mouth.   potassium chloride 10 MEQ tablet Commonly known as:  K-DUR Take by mouth.   tamsulosin 0.4 MG Caps capsule Commonly known as:  FLOMAX Take 1 capsule (0.4 mg total) by mouth daily.       Allergies: No Known Allergies  Family History: No family history on file.  Social History:  reports that  has never smoked. he has never used smokeless tobacco. He reports that he does not drink alcohol or use drugs.  ROS: UROLOGY Frequent Urination?: No Hard to postpone urination?: No Burning/pain with urination?: No Get up at night to urinate?: No Leakage of urine?: No Urine stream starts and stops?: No Trouble starting stream?: No Do you have to strain  to urinate?: No Blood in urine?: No Urinary tract infection?: No Sexually transmitted disease?: No Injury to kidneys or bladder?: No Painful intercourse?: No Weak stream?: No Erection problems?: No Penile pain?: No  Gastrointestinal Nausea?: No Vomiting?: No Indigestion/heartburn?: No Diarrhea?: No Constipation?: No  Constitutional Fever: No Night sweats?: No Weight loss?: No Fatigue?: No  Skin Skin rash/lesions?: No Itching?: No  Eyes Blurred vision?: No Double vision?: No  Ears/Nose/Throat Sore throat?: No Sinus problems?: No  Hematologic/Lymphatic Swollen glands?: No Easy bruising?: Yes  Cardiovascular Leg swelling?: Yes Chest pain?:  No  Respiratory Cough?: No Shortness of breath?: No  Endocrine Excessive thirst?: No  Musculoskeletal Back pain?: No Joint pain?: No  Neurological Headaches?: No Dizziness?: No  Psychologic Depression?: No Anxiety?: No  Physical Exam: BP 135/64   Pulse 77   Ht 5\' 9"  (1.753 m)   Wt 116 lb (52.6 kg)   BMI 17.13 kg/m   Constitutional: Well nourished. Alert and oriented, No acute distress. HEENT: [place] AT, moist mucus membranes. Trachea midline, no masses. Cardiovascular: No clubbing, cyanosis, or edema. Respiratory: Normal respiratory effort, no increased work of breathing. GI: Abdomen is soft, non tender, non distended, no abdominal masses. Liver and spleen not palpable.  No hernias appreciated.  Stool sample for occult testing is not indicated.   GU: No CVA tenderness.  No bladder fullness or masses.   Skin: No rashes, bruises or suspicious lesions. Lymph: No cervical or inguinal adenopathy. Neurologic: Grossly intact, no focal deficits, moving all 4 extremities. Psychiatric: Normal mood and affect.  Laboratory Data: Lab Results  Component Value Date   WBC 7.2 10/21/2016   HGB 10.8 (L) 10/21/2016   HCT 31.0 (L) 10/21/2016   MCV 89.5 10/21/2016   PLT 221 10/21/2016    Lab Results  Component Value Date   CREATININE 0.85 10/21/2016    Lab Results  Component Value Date   AST 30 10/21/2016   Lab Results  Component Value Date   ALT 20 10/21/2016    I have reviewed the labs.   Assessment & Plan:    1. Urinary retention  - foley catheter removed  - continue tamsulosin and finasteride  -voiding trial today    -return if unable to urinate or experiencing suprapubic discomfort  -follow-up in one month for I PSS score, PVR and exam.  2. Overflow incontinence  - caused by urinary retention  3. Massive BPH  - explained to the granddaughter that he is not a good surgical candidate due to his co-morbidities and age   - continue the tamsulosin and  finasteride  - explained that we may be looking out permanent indwelling Foley vs CIC vs SPT - will discuss further after his TOV in one month  Return in about 1 month (around 04/16/2017) for IPSS and PVR.  These notes generated with voice recognition software. I apologize for typographical errors.  Michiel Cowboy, PA-C  [facility] [address] [place], [place] [address] [phone]

## 2017-03-16 ENCOUNTER — Ambulatory Visit (INDEPENDENT_AMBULATORY_CARE_PROVIDER_SITE_OTHER): Payer: Medicare Other | Admitting: Urology

## 2017-03-16 VITALS — BP 135/64 | HR 77 | Ht 69.0 in | Wt 116.0 lb

## 2017-03-16 DIAGNOSIS — N401 Enlarged prostate with lower urinary tract symptoms: Secondary | ICD-10-CM | POA: Diagnosis not present

## 2017-03-16 DIAGNOSIS — R339 Retention of urine, unspecified: Secondary | ICD-10-CM

## 2017-03-16 DIAGNOSIS — N138 Other obstructive and reflux uropathy: Secondary | ICD-10-CM

## 2017-03-16 DIAGNOSIS — N39490 Overflow incontinence: Secondary | ICD-10-CM

## 2017-03-17 ENCOUNTER — Encounter: Payer: Self-pay | Admitting: Emergency Medicine

## 2017-03-17 ENCOUNTER — Other Ambulatory Visit: Payer: Self-pay

## 2017-03-17 ENCOUNTER — Inpatient Hospital Stay
Admission: EM | Admit: 2017-03-17 | Discharge: 2017-03-20 | DRG: 640 | Disposition: A | Discharge status: Home / Self Care | Payer: Medicare Other | Attending: Internal Medicine | Admitting: Internal Medicine

## 2017-03-17 DIAGNOSIS — E43 Unspecified severe protein-calorie malnutrition: Secondary | ICD-10-CM | POA: Diagnosis present

## 2017-03-17 DIAGNOSIS — E871 Hypo-osmolality and hyponatremia: Principal | ICD-10-CM | POA: Diagnosis present

## 2017-03-17 DIAGNOSIS — R338 Other retention of urine: Secondary | ICD-10-CM | POA: Diagnosis present

## 2017-03-17 DIAGNOSIS — Z23 Encounter for immunization: Secondary | ICD-10-CM

## 2017-03-17 DIAGNOSIS — E785 Hyperlipidemia, unspecified: Secondary | ICD-10-CM | POA: Diagnosis present

## 2017-03-17 DIAGNOSIS — N401 Enlarged prostate with lower urinary tract symptoms: Secondary | ICD-10-CM | POA: Diagnosis present

## 2017-03-17 DIAGNOSIS — E119 Type 2 diabetes mellitus without complications: Secondary | ICD-10-CM | POA: Diagnosis present

## 2017-03-17 DIAGNOSIS — F0390 Unspecified dementia without behavioral disturbance: Secondary | ICD-10-CM | POA: Diagnosis present

## 2017-03-17 DIAGNOSIS — N390 Urinary tract infection, site not specified: Secondary | ICD-10-CM | POA: Diagnosis present

## 2017-03-17 DIAGNOSIS — E860 Dehydration: Secondary | ICD-10-CM | POA: Diagnosis present

## 2017-03-17 DIAGNOSIS — Z681 Body mass index (BMI) 19 or less, adult: Secondary | ICD-10-CM

## 2017-03-17 DIAGNOSIS — K219 Gastro-esophageal reflux disease without esophagitis: Secondary | ICD-10-CM | POA: Diagnosis present

## 2017-03-17 DIAGNOSIS — I10 Essential (primary) hypertension: Secondary | ICD-10-CM | POA: Diagnosis present

## 2017-03-17 DIAGNOSIS — Z79899 Other long term (current) drug therapy: Secondary | ICD-10-CM | POA: Diagnosis not present

## 2017-03-17 DIAGNOSIS — N138 Other obstructive and reflux uropathy: Secondary | ICD-10-CM | POA: Diagnosis present

## 2017-03-17 DIAGNOSIS — B961 Klebsiella pneumoniae [K. pneumoniae] as the cause of diseases classified elsewhere: Secondary | ICD-10-CM | POA: Diagnosis present

## 2017-03-17 DIAGNOSIS — R339 Retention of urine, unspecified: Secondary | ICD-10-CM

## 2017-03-17 HISTORY — DX: Type 2 diabetes mellitus without complications: E11.9

## 2017-03-17 HISTORY — DX: Benign prostatic hyperplasia without lower urinary tract symptoms: N40.0

## 2017-03-17 HISTORY — DX: Essential (primary) hypertension: I10

## 2017-03-17 HISTORY — DX: Gastro-esophageal reflux disease without esophagitis: K21.9

## 2017-03-17 HISTORY — DX: Hyperlipidemia, unspecified: E78.5

## 2017-03-17 LAB — GLUCOSE, CAPILLARY: GLUCOSE-CAPILLARY: 137 mg/dL — AB (ref 65–99)

## 2017-03-17 LAB — URINALYSIS, COMPLETE (UACMP) WITH MICROSCOPIC
Bilirubin Urine: NEGATIVE
GLUCOSE, UA: NEGATIVE mg/dL
KETONES UR: NEGATIVE mg/dL
NITRITE: NEGATIVE
PH: 6 (ref 5.0–8.0)
Protein, ur: NEGATIVE mg/dL
Specific Gravity, Urine: 1.005 (ref 1.005–1.030)
Squamous Epithelial / LPF: NONE SEEN

## 2017-03-17 LAB — CBC WITH DIFFERENTIAL/PLATELET
BASOS ABS: 0 10*3/uL (ref 0–0.1)
BASOS PCT: 0 %
EOS ABS: 0 10*3/uL (ref 0–0.7)
EOS PCT: 0 %
HCT: 33.1 % — ABNORMAL LOW (ref 40.0–52.0)
Hemoglobin: 11.8 g/dL — ABNORMAL LOW (ref 13.0–18.0)
LYMPHS PCT: 13 %
Lymphs Abs: 1 10*3/uL (ref 1.0–3.6)
MCH: 31.8 pg (ref 26.0–34.0)
MCHC: 35.7 g/dL (ref 32.0–36.0)
MCV: 89 fL (ref 80.0–100.0)
MONO ABS: 0.7 10*3/uL (ref 0.2–1.0)
Monocytes Relative: 10 %
Neutro Abs: 5.9 10*3/uL (ref 1.4–6.5)
Neutrophils Relative %: 77 %
PLATELETS: 275 10*3/uL (ref 150–440)
RBC: 3.72 MIL/uL — AB (ref 4.40–5.90)
RDW: 13.6 % (ref 11.5–14.5)
WBC: 7.7 10*3/uL (ref 3.8–10.6)

## 2017-03-17 LAB — BASIC METABOLIC PANEL
Anion gap: 11 (ref 5–15)
BUN: 16 mg/dL (ref 6–20)
CALCIUM: 9.4 mg/dL (ref 8.9–10.3)
CO2: 23 mmol/L (ref 22–32)
Chloride: 86 mmol/L — ABNORMAL LOW (ref 101–111)
Creatinine, Ser: 0.82 mg/dL (ref 0.61–1.24)
GFR calc Af Amer: >60 mL/min (ref >60)
GLUCOSE: 120 mg/dL — AB (ref 65–99)
POTASSIUM: 3.8 mmol/L (ref 3.5–5.1)
SODIUM: 120 mmol/L — AB (ref 135–145)

## 2017-03-17 MED ORDER — INSULIN ASPART 100 UNIT/ML ~~LOC~~ SOLN
0.0000 [IU] | Freq: Three times a day (TID) | SUBCUTANEOUS | Status: DC
  Administered 2017-03-18: 1 [IU] via SUBCUTANEOUS
  Administered 2017-03-19 – 2017-03-20 (×4): 2 [IU] via SUBCUTANEOUS
  Filled 2017-03-17 (×5): qty 1

## 2017-03-17 MED ORDER — INSULIN ASPART 100 UNIT/ML ~~LOC~~ SOLN: 0.0000 [IU] | Freq: Every day | SUBCUTANEOUS | Status: DC

## 2017-03-17 MED ORDER — SODIUM CHLORIDE 0.9 % IV BOLUS (SEPSIS)
500.0000 mL | Freq: Once | INTRAVENOUS | Status: AC
  Administered 2017-03-17: 500 mL via INTRAVENOUS

## 2017-03-17 MED ORDER — ATORVASTATIN CALCIUM 20 MG PO TABS
20.0000 mg | ORAL_TABLET | Freq: Every day | ORAL | Status: DC
  Administered 2017-03-18 – 2017-03-20 (×3): 20 mg via ORAL
  Filled 2017-03-17 (×3): qty 1

## 2017-03-17 MED ORDER — ONDANSETRON HCL 4 MG PO TABS: 4.0000 mg | ORAL_TABLET | Freq: Four times a day (QID) | ORAL | Status: DC | PRN

## 2017-03-17 MED ORDER — ACETAMINOPHEN 325 MG PO TABS
650.0000 mg | ORAL_TABLET | Freq: Four times a day (QID) | ORAL | Status: DC | PRN
  Administered 2017-03-18 – 2017-03-20 (×3): 650 mg via ORAL
  Filled 2017-03-17 (×4): qty 2

## 2017-03-17 MED ORDER — PANTOPRAZOLE SODIUM 40 MG PO TBEC
40.0000 mg | DELAYED_RELEASE_TABLET | Freq: Every day | ORAL | Status: DC
  Administered 2017-03-18 – 2017-03-20 (×3): 40 mg via ORAL
  Filled 2017-03-17 (×3): qty 1

## 2017-03-17 MED ORDER — DEXTROSE 5 % IV SOLN
1.0000 g | Freq: Once | INTRAVENOUS | Status: DC
  Filled 2017-03-17: qty 10

## 2017-03-17 MED ORDER — ACETAMINOPHEN 650 MG RE SUPP: 650.0000 mg | Freq: Four times a day (QID) | RECTAL | Status: DC | PRN

## 2017-03-17 MED ORDER — SODIUM CHLORIDE 0.9 % IV SOLN
INTRAVENOUS | Status: AC
  Administered 2017-03-17: via INTRAVENOUS

## 2017-03-17 MED ORDER — LISINOPRIL 20 MG PO TABS
20.0000 mg | ORAL_TABLET | Freq: Every day | ORAL | Status: DC
  Administered 2017-03-18 – 2017-03-19 (×2): 20 mg via ORAL
  Filled 2017-03-17 (×2): qty 1

## 2017-03-17 MED ORDER — ENOXAPARIN SODIUM 40 MG/0.4ML ~~LOC~~ SOLN
40.0000 mg | SUBCUTANEOUS | Status: DC
  Administered 2017-03-18 – 2017-03-19 (×2): 40 mg via SUBCUTANEOUS
  Filled 2017-03-17 (×2): qty 0.4

## 2017-03-17 MED ORDER — LATANOPROST 0.005 % OP SOLN
1.0000 [drp] | Freq: Every day | OPHTHALMIC | Status: DC
  Administered 2017-03-18 – 2017-03-19 (×3): 1 [drp] via OPHTHALMIC
  Filled 2017-03-17: qty 2.5

## 2017-03-17 MED ORDER — CEFTRIAXONE SODIUM IN DEXTROSE 20 MG/ML IV SOLN
INTRAVENOUS | Status: AC
  Administered 2017-03-17: 1 g
  Filled 2017-03-17: qty 50

## 2017-03-17 MED ORDER — TAMSULOSIN HCL 0.4 MG PO CAPS
0.4000 mg | ORAL_CAPSULE | Freq: Every day | ORAL | Status: DC
  Administered 2017-03-18 – 2017-03-20 (×3): 0.4 mg via ORAL
  Filled 2017-03-17 (×3): qty 1

## 2017-03-17 MED ORDER — DEXTROSE 5 % IV SOLN
2.0000 g | INTRAVENOUS | Status: DC
  Administered 2017-03-18 – 2017-03-19 (×2): 2 g via INTRAVENOUS
  Filled 2017-03-17 (×3): qty 2

## 2017-03-17 MED ORDER — ONDANSETRON HCL 4 MG/2ML IJ SOLN: 4.0000 mg | Freq: Four times a day (QID) | INTRAMUSCULAR | Status: DC | PRN

## 2017-03-17 MED ORDER — FINASTERIDE 5 MG PO TABS
5.0000 mg | ORAL_TABLET | Freq: Every day | ORAL | Status: DC
  Administered 2017-03-18 – 2017-03-20 (×3): 5 mg via ORAL
  Filled 2017-03-17 (×3): qty 1

## 2017-03-17 NOTE — Progress Notes (Signed)
Pharmacy Antibiotic Note  Christian LarocheClarence J [name] is a 84 y.o. male admitted on 03/17/2017 with UTI.  Pharmacy has been consulted for ceftriaxone dosing.  Plan: Ceftriaxone 2 grams q 24 hours ordered  Height: 5\' 9"  (175.3 cm) Weight: 116 lb (52.6 kg) IBW/kg (Calculated) : 70.7  Temp (24hrs), Avg:97.9 F (36.6 C), Min:97.7 F (36.5 C), Max:98 F (36.7 C)  Recent Labs  Lab 03/17/17 1859  WBC 7.7  CREATININE 0.82    Estimated Creatinine Clearance: 46.3 mL/min (by C-G formula based on SCr of 0.82 mg/dL).    No Known Allergies  Antimicrobials this admission: Ceftriaxone 1/11  >>    >>   Dose adjustments this admission:   Microbiology results: 1/11 UCx: pending       1/11 UA: LE(+) NO2(-)  WBC TNTC Thank you for allowing pharmacy to be a part of this patient's care.  [name] S 03/17/2017 11:01 PM

## 2017-03-17 NOTE — ED Triage Notes (Signed)
Pt daughter states that pt had foley removed yesterday. Pt has been having problems with urinary retention. Today patient has been having urine leakage and has been urinating today per patient report.

## 2017-03-17 NOTE — ED Provider Notes (Addendum)
[facility] Emergency Department Provider Note  ____________________________________________   I have reviewed the triage vital signs and the nursing notes. Where available I have reviewed prior notes and, if possible and indicated, outside hospital notes.    HISTORY  Chief Complaint Urinary Retention    HPI [name] is a 84 y.o. male who presents today complaining of urinary retention.  Patient had a Foley taken out yesterday has large prostate, has had decreased urination today has no complaints of pain or fever.  Not clear exactly how much urination is had today but apparently not much.  Somewhat limited history.   Past Medical History:  Diagnosis Date  . Diabetes mellitus without complication (HCC)   . Hypertension     There are no active problems to display for this patient.   Past Surgical History:  Procedure Laterality Date  . KNEE SURGERY      Prior to Admission medications   Medication Sig Start Date End Date Taking? Authorizing Provider  atorvastatin (LIPITOR) 40 MG tablet Take 20 mg by mouth daily. 09/27/16   [provider]  Fe Fum-FePoly-Vit C-Vit B3 (INTEGRA) 62.5-62.5-40-3 MG CAPS Take 1 capsule by mouth daily. 10/12/16   [provider]  finasteride (PROSCAR) 5 MG tablet Take 1 tablet (5 mg total) by mouth daily. 02/15/17   Michiel CowboyMcGowan, Shannon A, PA-C  furosemide (LASIX) 20 MG tablet Take by mouth. 09/28/16 09/28/17  [provider]  latanoprost (XALATAN) 0.005 % ophthalmic solution Place 1 drop into both eyes at bedtime.    [provider]  lisinopril-hydrochlorothiazide (PRINZIDE,ZESTORETIC) 20-12.5 MG tablet Take 1 tablet by mouth daily. 09/27/16   [provider]  metFORMIN (GLUCOPHAGE-XR) 500 MG 24 hr tablet Take by mouth. 11/02/16   [provider]  omeprazole (PRILOSEC) 40 MG capsule Take 40 mg by mouth daily. 09/27/16   [provider]  POLYSACCHARIDE IRON COMPLEX  PO Take by mouth. 10/12/16   [provider]  potassium chloride (K-DUR) 10 MEQ tablet Take by mouth. 09/28/16 09/28/17  [provider]  tamsulosin (FLOMAX) 0.4 MG CAPS capsule Take 1 capsule (0.4 mg total) by mouth daily. 02/15/17   Michiel CowboyMcGowan, Shannon A, PA-C    Allergies Patient has no known allergies.  No family history on file.  Social History Social History   Tobacco Use  . Smoking status: Never Smoker  . Smokeless tobacco: Never Used  Substance Use Topics  . Alcohol use: No  . Drug use: No    Review of Systems Constitutional: No fever/chills Eyes: No visual changes. ENT: No sore throat. No stiff neck no neck pain Cardiovascular: Denies chest pain. Respiratory: Denies shortness of breath. Gastrointestinal:   no vomiting.  No diarrhea.  No constipation. Genitourinary: Negative for dysuria. Musculoskeletal: Negative lower extremity swelling Skin: Negative for rash. Neurological: Negative for severe headaches, focal weakness or numbness.   ____________________________________________   PHYSICAL EXAM:  VITAL SIGNS: ED Triage Vitals  Enc Vitals Group     BP 03/17/17 1640 113/65     Pulse Rate 03/17/17 1640 87     Resp 03/17/17 1640 16     Temp 03/17/17 1640 97.7 F (36.5 C)     Temp Source 03/17/17 1640 Oral     SpO2 03/17/17 1640 97 %     Weight 03/17/17 1638 116 lb (52.6 kg)     Height 03/17/17 1638 5\' 9"  (1.753 m)     Head Circumference --      Peak Flow --  Pain Score --      Pain Loc --      Pain Edu? --      Excl. in GC? --     Constitutional: Alert and oriented. Well appearing and in no acute distress. Eyes: Conjunctivae are normal Head: Atraumatic HEENT: No congestion/rhinnorhea. Mucous membranes are moist.  Oropharynx non-erythematous Neck:   Nontender with no meningismus, no masses, no stridor Cardiovascular: Normal rate, regular rhythm. Grossly normal heart sounds.  Good peripheral circulation. Respiratory: Normal  respiratory effort.  No retractions. Lungs CTAB. Abdominal: Soft and nontender. No distention. No guarding no rebound there is a fullness in the lower abdomen consistent with likely urinary retention Back:  There is no focal tenderness or step off.  there is no midline tenderness there are no lesions noted. there is no CVA tenderness Musculoskeletal: No lower extremity tenderness, no upper extremity tenderness. No joint effusions, no DVT signs strong distal pulses no edema Neurologic:  Normal speech and language. No gross focal neurologic deficits are appreciated.  Skin:  Skin is warm, dry and intact. No rash noted. Psychiatric: Mood and affect are normal. Speech and behavior are normal.  ____________________________________________   LABS (all labs ordered are listed, but only abnormal results are displayed)  Labs Reviewed  URINALYSIS, COMPLETE (UACMP) WITH MICROSCOPIC  BASIC METABOLIC PANEL  CBC WITH DIFFERENTIAL/PLATELET    Pertinent labs  results that were available during my care of the patient were reviewed by me and considered in my medical decision making (see chart for details). ____________________________________________  EKG  I personally interpreted any EKGs ordered by me or triage  ____________________________________________  RADIOLOGY  Pertinent labs & imaging results that were available during my care of the patient were reviewed by me and considered in my medical decision making (see chart for details). If possible, patient and/or family made aware of any abnormal findings.  No results found. ____________________________________________    PROCEDURES  Procedure(s) performed: None  Procedures  Critical Care performed: None  ____________________________________________   INITIAL IMPRESSION / ASSESSMENT AND PLAN / ED COURSE  Pertinent labs & imaging results that were available during my care of the patient were reviewed by me and considered in my  medical decision making (see chart for details).  Patient has a liter of urinary retention after Foley removal, her bladder scan and per what we got out of the Foley.  And feels no different after the Foley because he really was not symptomatic with this will check BUN and creatinine CBC urinalysis and reassess, likely patient will have to go back home with a leg bag and follow closely with urology.  Foley was placed without complaint or issue.  ----------------------------------------- 8:22 PM on 03/17/2017 -----------------------------------------  Will admit for uti and hypona    ____________________________________________   FINAL CLINICAL IMPRESSION(S) / ED DIAGNOSES  Final diagnoses:  None      This chart was dictated using voice recognition software.  Despite best efforts to proofread,  errors can occur which can change meaning.      Jeanmarie Plant, MD 03/17/17 Windell Moment    Jeanmarie Plant, MD 03/17/17 2022

## 2017-03-17 NOTE — H&P (Signed)
[facility] [place] [facility]   PATIENT NAME: Christian RiserClarence [name]    MR#:  [id number]  DATE OF BIRTH:  10/01/1926  DATE OF ADMISSION:  03/17/2017  PRIMARY CARE PHYSICIAN: [name], [name], MD   REQUESTING/REFERRING PHYSICIAN: Alphonzo LemmingsMcShane, MD  CHIEF COMPLAINT:   Chief Complaint  Patient presents with  . Urinary Retention    HISTORY OF PRESENT ILLNESS:  [name]  is a 84 y.o. male who presents with urinary retention weakness.  Patient has had a Foley in place for the past 5 weeks due to BPH with lower urinary obstruction.  He had it removed yesterday, and all day today has only been able to pass very small amounts of urine.  He is also had progressive weakness including some falls over the past couple of days.  Here in the ED he was found to have UTI, hyponatremia, and persistent urinary retention.  Hospitalist were called for admission  PAST MEDICAL HISTORY:   Past Medical History:  Diagnosis Date  . BPH (benign prostatic hyperplasia)   . Diabetes mellitus without complication (HCC)   . GERD (gastroesophageal reflux disease)   . HLD (hyperlipidemia)   . Hypertension     PAST SURGICAL HISTORY:   Past Surgical History:  Procedure Laterality Date  . HIP FRACTURE SURGERY    . KNEE SURGERY      SOCIAL HISTORY:   Social History   Tobacco Use  . Smoking status: Never Smoker  . Smokeless tobacco: Never Used  Substance Use Topics  . Alcohol use: No    FAMILY HISTORY:   Family History  Problem Relation Age of Onset  . Diabetes Father   . Hypertension Father   . Prostate cancer Father     DRUG ALLERGIES:  No Known Allergies  MEDICATIONS AT HOME:   Prior to Admission medications   Medication Sig Start Date End Date Taking? Authorizing Provider  atorvastatin (LIPITOR) 40 MG tablet Take 20 mg by mouth daily. 09/27/16  Yes [provider]  Fe Fum-FePoly-Vit C-Vit B3 (INTEGRA) 62.5-62.5-40-3 MG CAPS Take 1 capsule by  mouth daily. 10/12/16  Yes [provider]  finasteride (PROSCAR) 5 MG tablet Take 1 tablet (5 mg total) by mouth daily. 02/15/17  Yes McGowan, Carollee HerterShannon A, PA-C  latanoprost (XALATAN) 0.005 % ophthalmic solution Place 1 drop into both eyes at bedtime.   Yes [provider]  lisinopril-hydrochlorothiazide (PRINZIDE,ZESTORETIC) 20-12.5 MG tablet Take 1 tablet by mouth daily. 09/27/16  Yes [provider]  omeprazole (PRILOSEC) 40 MG capsule Take 40 mg by mouth daily. 09/27/16  Yes [provider]  tamsulosin (FLOMAX) 0.4 MG CAPS capsule Take 1 capsule (0.4 mg total) by mouth daily. 02/15/17  Yes McGowan, Carollee HerterShannon A, PA-C    REVIEW OF SYSTEMS:  Review of Systems  Constitutional: Negative for chills, fever, malaise/fatigue and weight loss.  HENT: Negative for ear pain, hearing loss and tinnitus.   Eyes: Negative for blurred vision, double vision, pain and redness.  Respiratory: Negative for cough, hemoptysis and shortness of breath.   Cardiovascular: Negative for chest pain, palpitations, orthopnea and leg swelling.  Gastrointestinal: Negative for abdominal pain, constipation, diarrhea, nausea and vomiting.  Genitourinary: Negative for dysuria, frequency and hematuria.       Retention  Musculoskeletal: Negative for back pain, joint pain and neck pain.  Skin:       No acne, rash, or lesions  Neurological: Positive for weakness. Negative for dizziness, tremors and focal weakness.  Endo/Heme/Allergies: Negative  for polydipsia. Does not bruise/bleed easily.  Psychiatric/Behavioral: Negative for depression. The patient is not nervous/anxious and does not have insomnia.      VITAL SIGNS:   Vitals:   03/17/17 1638 03/17/17 1640 03/17/17 1929  BP:  113/65 (!) 145/70  Pulse:  87 (!) 120  Resp:  16 18  Temp:  97.7 F (36.5 C)   TempSrc:  Oral   SpO2:  97% 100%  Weight: 52.6 kg (116 lb)    Height: 5\' 9"  (1.753 m)     Wt Readings from Last 3 Encounters:   03/17/17 52.6 kg (116 lb)  03/16/17 52.6 kg (116 lb)  02/22/17 52.6 kg (116 lb)    PHYSICAL EXAMINATION:  Physical Exam  Vitals reviewed. Constitutional: He is oriented to person, place, and time. He appears well-developed and well-nourished. No distress.  HENT:  Head: Normocephalic and atraumatic.  Mouth/Throat: Oropharynx is clear and moist.  Eyes: Conjunctivae and EOM are normal. Pupils are equal, round, and reactive to light. No scleral icterus.  Neck: Normal range of motion. Neck supple. No JVD present. No thyromegaly present.  Cardiovascular: Normal rate, regular rhythm and intact distal pulses. Exam reveals no gallop and no friction rub.  No murmur heard. Respiratory: Effort normal and breath sounds normal. No respiratory distress. He has no wheezes. He has no rales.  GI: Soft. Bowel sounds are normal. He exhibits no distension. There is no tenderness.  Musculoskeletal: Normal range of motion. He exhibits no edema.  No arthritis, no gout  Lymphadenopathy:    He has no cervical adenopathy.  Neurological: He is alert and oriented to person, place, and time. No cranial nerve deficit.  No dysarthria, no aphasia  Skin: Skin is warm and dry. No rash noted. No erythema.  Psychiatric: He has a normal mood and affect. His behavior is normal. Judgment and thought content normal.    LABORATORY PANEL:   CBC Recent Labs  Lab 03/17/17 1859  WBC 7.7  HGB 11.8*  HCT 33.1*  PLT 275   ------------------------------------------------------------------------------------------------------------------  Chemistries  Recent Labs  Lab 03/17/17 1859  NA 120*  K 3.8  CL 86*  CO2 23  GLUCOSE 120*  BUN 16  CREATININE 0.82  CALCIUM 9.4   ------------------------------------------------------------------------------------------------------------------  Cardiac Enzymes No results for input(s): TROPONINI in the last 168  hours. ------------------------------------------------------------------------------------------------------------------  RADIOLOGY:  No results found.  EKG:   Orders placed or performed during the hospital encounter of 10/21/16  . ED EKG  . ED EKG  . EKG 12-Lead  . EKG 12-Lead  . Repeat EKG  . Repeat EKG    IMPRESSION AND PLAN:  Principal Problem:   UTI (urinary tract infection) -IV Rocephin started, urine culture sent, will get a urology consult as below Active Problems:   Hyponatremia -likely due to his urine infection and use of hydrochlorothiazide   BPH with obstruction/lower urinary tract symptoms -Foley replaced in ED, urology consult as above   Diabetes (HCC) -sliding scale insulin with corresponding glucose checks   HTN (hypertension) -continue home meds   GERD (gastroesophageal reflux disease) -home dose PPI   HLD (hyperlipidemia) -home dose statin  All the records are reviewed and case discussed with ED provider. Management plans discussed with the patient and/or family.  DVT PROPHYLAXIS: SubQ lovenox  GI PROPHYLAXIS: PPI  ADMISSION STATUS: Inpatient  CODE STATUS: Full Code Status History    This patient does not have a recorded code status. Please follow your organizational policy for patients in this situation.  Advance Directive Documentation     Most Recent Value  Type of Advance Directive  Healthcare Power of Attorney  Pre-existing out of facility DNR order (yellow form or pink MOST form)  No data  "MOST" Form in Place?  No data      TOTAL TIME TAKING CARE OF THIS PATIENT: 45 minutes.   Roverto Bodmer FIELDING 03/17/2017, 8:58 PM  [employer]  [phone]  CC: Primary care physician; Lauro Regulus, MD  Note:  This document was prepared using Dragon voice recognition software and may include unintentional dictation errors.

## 2017-03-17 NOTE — ED Notes (Signed)
Gave pt food tray. 

## 2017-03-17 NOTE — ED Notes (Signed)
Pt states that yesterday they took a catheter out and has been dribbling and had urinary retention ever since. Family at bedside.

## 2017-03-18 ENCOUNTER — Other Ambulatory Visit: Payer: Self-pay

## 2017-03-18 DIAGNOSIS — N401 Enlarged prostate with lower urinary tract symptoms: Secondary | ICD-10-CM

## 2017-03-18 DIAGNOSIS — R338 Other retention of urine: Secondary | ICD-10-CM

## 2017-03-18 LAB — BASIC METABOLIC PANEL
Anion gap: 6 (ref 5–15)
BUN: 12 mg/dL (ref 6–20)
CALCIUM: 8.5 mg/dL — AB (ref 8.9–10.3)
CHLORIDE: 95 mmol/L — AB (ref 101–111)
CO2: 25 mmol/L (ref 22–32)
CREATININE: 0.72 mg/dL (ref 0.61–1.24)
GFR calc Af Amer: >60 mL/min (ref >60)
GFR calc non Af Amer: >60 mL/min (ref >60)
Glucose, Bld: 95 mg/dL (ref 65–99)
Potassium: 3.2 mmol/L — ABNORMAL LOW (ref 3.5–5.1)
SODIUM: 126 mmol/L — AB (ref 135–145)

## 2017-03-18 LAB — GLUCOSE, CAPILLARY
GLUCOSE-CAPILLARY: 98 mg/dL (ref 65–99)
GLUCOSE-CAPILLARY: 122 mg/dL — AB (ref 65–99)
Glucose-Capillary: 90 mg/dL (ref 65–99)
Glucose-Capillary: 136 mg/dL — ABNORMAL HIGH (ref 65–99)

## 2017-03-18 LAB — CBC
HCT: 27.5 % — ABNORMAL LOW (ref 40.0–52.0)
Hemoglobin: 9.6 g/dL — ABNORMAL LOW (ref 13.0–18.0)
MCH: 31.3 pg (ref 26.0–34.0)
MCHC: 34.9 g/dL (ref 32.0–36.0)
MCV: 89.6 fL (ref 80.0–100.0)
PLATELETS: 223 10*3/uL (ref 150–440)
RBC: 3.07 MIL/uL — ABNORMAL LOW (ref 4.40–5.90)
RDW: 13.9 % (ref 11.5–14.5)
WBC: 7.5 10*3/uL (ref 3.8–10.6)

## 2017-03-18 MED ORDER — INFLUENZA VAC SPLIT HIGH-DOSE 0.5 ML IM SUSY
0.5000 mL | PREFILLED_SYRINGE | INTRAMUSCULAR | Status: AC
  Administered 2017-03-20: 0.5 mL via INTRAMUSCULAR
  Filled 2017-03-18 (×2): qty 0.5

## 2017-03-18 MED ORDER — SODIUM CHLORIDE 0.9 % IV SOLN
INTRAVENOUS | Status: AC
  Administered 2017-03-18 – 2017-03-19 (×2): via INTRAVENOUS

## 2017-03-18 MED ORDER — POTASSIUM CHLORIDE CRYS ER 20 MEQ PO TBCR
40.0000 meq | EXTENDED_RELEASE_TABLET | ORAL | Status: AC
  Administered 2017-03-18 (×2): 40 meq via ORAL
  Filled 2017-03-18 (×2): qty 2

## 2017-03-18 NOTE — Progress Notes (Signed)
[facility] - [place] at [facility]   PATIENT NAME: [name]    MR#:  [id number]  DATE OF BIRTH:  11/13/1926  SUBJECTIVE: Admitted for UTI.,  Hyponatremia.  Patient alert, awake, denies any complaints.  Draining clear yellow urine.  On IV fluids.  Discussed with patient's granddaughter.  CHIEF COMPLAINT:   Chief Complaint  Patient presents with  . Urinary Retention    REVIEW OF SYSTEMS:   ROS CONSTITUTIONAL: No fever, fatigue or weakness.  EYES: No blurred or double vision.  EARS, NOSE, AND THROAT: No tinnitus or ear pain.  RESPIRATORY: No cough, shortness of breath, wheezing or hemoptysis.  CARDIOVASCULAR: No chest pain, orthopnea, edema.  GASTROINTESTINAL: No nausea, vomiting, diarrhea or abdominal pain.  GENITOURINARY: No dysuria, hematuria.  ENDOCRINE: No polyuria, nocturia,  HEMATOLOGY: No anemia, easy bruising or bleeding SKIN: No rash or lesion. MUSCULOSKELETAL: No joint pain or arthritis.   NEUROLOGIC: No tingling, numbness, weakness.  PSYCHIATRY: No anxiety or depression.   DRUG ALLERGIES:  No Known Allergies  VITALS:  Blood pressure (!) 114/52, pulse 61, temperature 98.3 F (36.8 C), temperature source Oral, resp. rate 18, height 5\' 9"  (1.753 m), weight 52.6 kg (116 lb), SpO2 100 %.  PHYSICAL EXAMINATION:  GENERAL:  84 y.o.-year-old patient lying in the bed with no acute distress.  EYES: Pupils equal, round, reactive to light and accommodation. No scleral icterus. Extraocular muscles intact.  HEENT: Head atraumatic, normocephalic. Oropharynx and nasopharynx clear.  NECK:  Supple, no jugular venous distention. No thyroid enlargement, no tenderness.  LUNGS: Normal breath sounds bilaterally, no wheezing, rales,rhonchi or crepitation. No use of accessory muscles of respiration.  CARDIOVASCULAR: S1, S2 normal. No murmurs, rubs, or gallops.  ABDOMEN: Soft, nontender, nondistended. Bowel sounds present. No organomegaly or mass.   EXTREMITIES: No pedal edema, cyanosis, or clubbing.  NEUROLOGIC: Cranial nerves II through XII are intact. Muscle strength 5/5 in all extremities. Sensation intact. Gait not checked.  PSYCHIATRIC: The patient is alert and oriented x 3.  SKIN: No obvious rash, lesion, or ulcer.    LABORATORY PANEL:   CBC Recent Labs  Lab 03/18/17 0710  WBC 7.5  HGB 9.6*  HCT 27.5*  PLT 223   ------------------------------------------------------------------------------------------------------------------  Chemistries  Recent Labs  Lab 03/18/17 0710  NA 126*  K 3.2*  CL 95*  CO2 25  GLUCOSE 95  BUN 12  CREATININE 0.72  CALCIUM 8.5*   ------------------------------------------------------------------------------------------------------------------  Cardiac Enzymes No results for input(s): TROPONINI in the last 168 hours. ------------------------------------------------------------------------------------------------------------------  RADIOLOGY:  No results found.  EKG:   Orders placed or performed during the hospital encounter of 10/21/16  . ED EKG  . ED EKG  . EKG 12-Lead  . EKG 12-Lead  . Repeat EKG  . Repeat EKG    ASSESSMENT AND PLAN:   Acute hyponatremia secondary to dehydration: Improving with IV fluids, sodium is up from 120-126.  Continue IV fluids, check frequent Chem-7's. 2.  UTI: Continue IV antibiotics, follow urine cultures. 3.  Urinary retention secondary to massive BPH.  Patient followed by [facility] urology, talking about having chronic Foley/CIC.  Discussed with granddaughter.  Continue Flomax, Proscar. 4.  Essential hypertension: Controlled.     All the records are reviewed and case discussed with Care Management/Social Workerr. Management plans discussed with the patient, family and they are in agreement.  CODE STATUS:full  TOTAL TIME TAKING CARE OF THIS PATIENT: 35 minutes.   POSSIBLE D/C IN 1-2DAYS, DEPENDING ON CLINICAL  CONDITION.  Katha HammingSnehalatha Umeka Wrench M.D on 03/18/2017 at 12:09 PM  Between 7am to 6pm - Pager - [phone]  After 6pm go to [email or web address] - password [email or web address] [facility]  [facility] [place]  Office  [phone]  CC: Primary care physician; [name], [name], MD   Note: This dictation was prepared with Dragon dictation along with smaller phrase technology. Any transcriptional errors that result from this process are unintentional.

## 2017-03-18 NOTE — Consult Note (Addendum)
Urology Consult  Referring physician: Dr. Vianne Bulls Reason for referral: urinary retention  Chief Complaint: suprapubic pain  History of Present Illness: [name] is a 84yo with a h xof BPH and urinary retention who presented to the ER 1 day after his voiding trial at [facility] with urinary retention.  Patient has dementia and is a poor historian.  His first voiding trial was on which he failed and a Foley was replaced. The was then seen on 03/16/2016 and had a voiding trial. He was able to urinate until the morning of 03/17/2017. At that time he had urgency, dysuria and severe suprapubic pain. The pain was relieved with foley catether placement. He has not been taking his flomax per the patient. UA was concerning for infection.    Past Medical History:  Diagnosis Date  . BPH (benign prostatic hyperplasia)   . Diabetes mellitus without complication ([place])   . GERD (gastroesophageal reflux disease)   . HLD (hyperlipidemia)   . Hypertension    Past Surgical History:  Procedure Laterality Date  . HIP FRACTURE SURGERY    . KNEE SURGERY      Medications: I have reviewed the patient's current medications. Allergies: No Known Allergies  Family History  Problem Relation Age of Onset  . Diabetes Father   . Hypertension Father   . Prostate cancer Father    Social History:  reports that  has never smoked. he has never used smokeless tobacco. He reports that he does not drink alcohol or use drugs.  Review of Systems  Unable to perform ROS: Dementia    Physical Exam:  Vital signs in last 24 hours: Temp:  [97.6 F (36.4 C)-98.3 F (36.8 C)] 97.6 F (36.4 C) (01/12 1406) Pulse Rate:  [61-120] 61 (01/12 1406) Resp:  [14-18] 14 (01/12 1406) BP: (109-145)/(46-70) 119/46 (01/12 1406) SpO2:  [97 %-100 %] 100 % (01/12 1406) Weight:  [52.6 kg (116 lb)] 52.6 kg (116 lb) (01/11 1638) Physical Exam  Constitutional: He is oriented to person, place, and time. He appears well-developed  and well-nourished.  HENT:  Head: Normocephalic and atraumatic.  Eyes: EOM are normal. Pupils are equal, round, and reactive to light.  Neck: Normal range of motion. No thyromegaly present.  Cardiovascular: Normal rate and regular rhythm.  Respiratory: Effort normal. No respiratory distress.  GI: Soft. He exhibits no distension. Hernia confirmed negative in the right inguinal area and confirmed negative in the left inguinal area.  Genitourinary: Testes normal and penis normal.  Musculoskeletal: Normal range of motion. He exhibits no edema.  Lymphadenopathy:       Right: No inguinal adenopathy present.       Left: No inguinal adenopathy present.  Neurological: He is alert and oriented to person, place, and time.  Skin: Skin is warm and dry.  Psychiatric: He has a normal mood and affect. His behavior is normal. Thought content normal.    Laboratory Data:  Results for orders placed or performed during the hospital encounter of 03/17/17 (from the past 72 hour(s))  Urinalysis, Complete w Microscopic     Status: Abnormal   Collection Time: 03/17/17  6:59 PM  Result Value Ref Range   Color, Urine YELLOW (A) YELLOW   APPearance CLOUDY (A) CLEAR   Specific Gravity, Urine 1.005 1.005 - 1.030   pH 6.0 5.0 - 8.0   Glucose, UA NEGATIVE NEGATIVE mg/dL   Hgb urine dipstick SMALL (A) NEGATIVE   Bilirubin Urine NEGATIVE NEGATIVE   Ketones, ur NEGATIVE NEGATIVE  mg/dL   Protein, ur NEGATIVE NEGATIVE mg/dL   Nitrite NEGATIVE NEGATIVE   Leukocytes, UA MODERATE (A) NEGATIVE   RBC / HPF 0-5 0 - 5 RBC/hpf   WBC, UA TOO NUMEROUS TO COUNT 0 - 5 WBC/hpf   Bacteria, UA FEW (A) NONE SEEN   Squamous Epithelial / LPF NONE SEEN NONE SEEN   WBC Clumps PRESENT    Mucus PRESENT     Comment: Performed at [facility], [address] [place], [place] [address]  Basic metabolic panel     Status: Abnormal   Collection Time: 03/17/17  6:59 PM  Result Value Ref Range   Sodium 120 (L) 135 - 145 mmol/L    Potassium 3.8 3.5 - 5.1 mmol/L   Chloride 86 (L) 101 - 111 mmol/L   CO2 23 22 - 32 mmol/L   Glucose, Bld 120 (H) 65 - 99 mg/dL   BUN 16 6 - 20 mg/dL   Creatinine, Ser 0.82 0.61 - 1.24 mg/dL   Calcium 9.4 8.9 - 10.3 mg/dL   GFR calc non Af Amer >60 >60 mL/min   GFR calc Af Amer >60 >60 mL/min    Comment: (NOTE) The eGFR has been calculated using the CKD EPI equation. This calculation has not been validated in all clinical situations. eGFR's persistently <60 mL/min signify possible Chronic Kidney Disease.    Anion gap 11 5 - 15    Comment: Performed at [facility], [address] [place], [place] [address]  CBC with Differential     Status: Abnormal   Collection Time: 03/17/17  6:59 PM  Result Value Ref Range   WBC 7.7 3.8 - 10.6 K/uL   RBC 3.72 (L) 4.40 - 5.90 MIL/uL   Hemoglobin 11.8 (L) 13.0 - 18.0 g/dL   HCT 33.1 (L) 40.0 - 52.0 %   MCV 89.0 80.0 - 100.0 fL   MCH 31.8 26.0 - 34.0 pg   MCHC 35.7 32.0 - 36.0 g/dL   RDW 13.6 11.5 - 14.5 %   Platelets 275 150 - 440 K/uL   Neutrophils Relative % 77 %   Neutro Abs 5.9 1.4 - 6.5 K/uL   Lymphocytes Relative 13 %   Lymphs Abs 1.0 1.0 - 3.6 K/uL   Monocytes Relative 10 %   Monocytes Absolute 0.7 0.2 - 1.0 K/uL   Eosinophils Relative 0 %   Eosinophils Absolute 0.0 0 - 0.7 K/uL   Basophils Relative 0 %   Basophils Absolute 0.0 0 - 0.1 K/uL    Comment: Performed at [facility], [address] [place], [place] [address]  Glucose, capillary     Status: Abnormal   Collection Time: 03/17/17 11:50 PM  Result Value Ref Range   Glucose-Capillary 137 (H) 65 - 99 mg/dL  Basic metabolic panel     Status: Abnormal   Collection Time: 03/18/17  7:10 AM  Result Value Ref Range   Sodium 126 (L) 135 - 145 mmol/L    Comment: RESULTS VERIFIED BY REPEAT TESTING SNJ   Potassium 3.2 (L) 3.5 - 5.1 mmol/L   Chloride 95 (L) 101 - 111 mmol/L   CO2 25 22 - 32 mmol/L   Glucose, Bld 95 65 - 99 mg/dL   BUN 12 6 - 20 mg/dL    Creatinine, Ser 0.72 0.61 - 1.24 mg/dL   Calcium 8.5 (L) 8.9 - 10.3 mg/dL   GFR calc non Af Amer >60 >60 mL/min   GFR calc Af Amer >60 >60  mL/min    Comment: (NOTE) The eGFR has been calculated using the CKD EPI equation. This calculation has not been validated in all clinical situations. eGFR's persistently <60 mL/min signify possible Chronic Kidney Disease.    Anion gap 6 5 - 15    Comment: Performed at [facility], [address] [place], [place] [address]  CBC     Status: Abnormal   Collection Time: 03/18/17  7:10 AM  Result Value Ref Range   WBC 7.5 3.8 - 10.6 K/uL   RBC 3.07 (L) 4.40 - 5.90 MIL/uL   Hemoglobin 9.6 (L) 13.0 - 18.0 g/dL   HCT 27.5 (L) 40.0 - 52.0 %   MCV 89.6 80.0 - 100.0 fL   MCH 31.3 26.0 - 34.0 pg   MCHC 34.9 32.0 - 36.0 g/dL   RDW 13.9 11.5 - 14.5 %   Platelets 223 150 - 440 K/uL    Comment: Performed at [facility], [address] [place], [place] [address]  Glucose, capillary     Status: None   Collection Time: 03/18/17  7:47 AM  Result Value Ref Range   Glucose-Capillary 90 65 - 99 mg/dL   Comment 1 Notify RN   Glucose, capillary     Status: None   Collection Time: 03/18/17 12:18 PM  Result Value Ref Range   Glucose-Capillary 98 65 - 99 mg/dL   Comment 1 Notify RN    No results found for this or any previous visit (from the past 240 hour(s)). Creatinine: Recent Labs    03/17/17 1859 03/18/17 0710  CREATININE 0.82 0.72   Baseline Creatinine: 0.7  Impression/Assessment:  84yo with BPH and Urinary retention  Plan:  I discussed the treatment options including increasing alpha blocker versus surgical intervention versus continue chronic indwelling foley. After discussing the treatment options the patient wishes to continue indwelling foley and not change medication or consider surgical intervention. Please continue indwelling foley. Patient should followup in 3-4 weeks after discharge at [facility] for foley  catheter change  Nicolette Bang 03/18/2017, 3:31 PM

## 2017-03-19 LAB — BASIC METABOLIC PANEL
ANION GAP: 5 (ref 5–15)
BUN: 12 mg/dL (ref 6–20)
CO2: 24 mmol/L (ref 22–32)
Calcium: 8.3 mg/dL — ABNORMAL LOW (ref 8.9–10.3)
Chloride: 99 mmol/L — ABNORMAL LOW (ref 101–111)
Creatinine, Ser: 0.7 mg/dL (ref 0.61–1.24)
GFR calc Af Amer: >60 mL/min (ref >60)
Glucose, Bld: 104 mg/dL — ABNORMAL HIGH (ref 65–99)
POTASSIUM: 3.8 mmol/L (ref 3.5–5.1)
SODIUM: 128 mmol/L — AB (ref 135–145)

## 2017-03-19 LAB — GLUCOSE, CAPILLARY
GLUCOSE-CAPILLARY: 189 mg/dL — AB (ref 65–99)
Glucose-Capillary: 86 mg/dL (ref 65–99)
Glucose-Capillary: 154 mg/dL — ABNORMAL HIGH (ref 65–99)
Glucose-Capillary: 184 mg/dL — ABNORMAL HIGH (ref 65–99)

## 2017-03-19 MED ORDER — ENSURE ENLIVE PO LIQD
237.0000 mL | Freq: Two times a day (BID) | ORAL | Status: DC
  Administered 2017-03-19 – 2017-03-20 (×3): 237 mL via ORAL

## 2017-03-19 NOTE — Progress Notes (Signed)
Initial Nutrition Assessment  DOCUMENTATION CODES:   Severe malnutrition in context of social or environmental circumstances, Underweight  INTERVENTION:  Provide Ensure Enlive po BID, each supplement provides 350 kcal and 20 grams of protein.  Encouraged adequate intake of calories and protein at meals.  NUTRITION DIAGNOSIS:   Severe Malnutrition related to social / environmental circumstances(advanced age, lives alone at home, decreased appetite) as evidenced by severe fat depletion, severe muscle depletion.  GOAL:   Patient will meet greater than or equal to 90% of their needs  MONITOR:   PO intake, Supplement acceptance, Labs, Weight trends, I & O's  REASON FOR ASSESSMENT:   Malnutrition Screening Tool    ASSESSMENT:   84 year old male with PMHx of DM type 2, HTN, HLD, GERD, BPH admitted with acute hyponatremia due to dehydration, UTI, urinary retention secondary to BPH.   Met with patient at bedside. He was eating his lunch. He reports he lost his appetite for a while (unable to specify time frame) but reports it is coming back now. He lives alone at home and prepares his own meals. He also occasionally gets food out. He is unable to report details of intake. No family members at bedside.  Patient reports he is unsure of weight history. Per chart he was 123.2 lbs on 10/21/2016. RD was unable to verify current weight as patient was on chair and not in bed.  Seems as if patient has been eating fairly well here. Per chart meal completion 75-100%.  Medications reviewed and include: Novolog 0-9 units TID, Novolog 0-5 units QHS, pantoprazole, ceftriaxone.  Labs reviewed: CBG 86-189, Sodium 128, Chloride 99.  Discussed with RN.  NUTRITION - FOCUSED PHYSICAL EXAM:    Most Recent Value  Orbital Region  Severe depletion  Upper Arm Region  Severe depletion  Thoracic and Lumbar Region  Severe depletion  Buccal Region  Severe depletion  Temple Region  Severe depletion   Clavicle Bone Region  Severe depletion  Clavicle and Acromion Bone Region  Severe depletion  Scapular Bone Region  Severe depletion  Dorsal Hand  Severe depletion  Patellar Region  Severe depletion  Anterior Thigh Region  Severe depletion  Posterior Calf Region  Severe depletion  Edema (RD Assessment)  Mild  Hair  Reviewed  Eyes  Reviewed  Mouth  Reviewed  Skin  Reviewed  Nails  Reviewed     Diet Order:  Diet heart healthy/carb modified Room service appropriate? Yes; Fluid consistency: Thin  EDUCATION NEEDS:   No education needs have been identified at this time  Skin:  Skin Assessment: Reviewed RN Assessment  Last BM:  03/17/2017  Height:   Ht Readings from Last 1 Encounters:  03/17/17 5' 9" (1.753 m)    Weight:   Wt Readings from Last 1 Encounters:  03/17/17 116 lb (52.6 kg)    Ideal Body Weight:  72.7 kg  BMI:  Body mass index is 17.13 kg/m.  Estimated Nutritional Needs:   Kcal:  1430-1670 (MSJ x 1.2-1.4)  Protein:  65-75 grams (1.2-1.4 grams/kg)  Fluid:  1.3 L/day (25 mL/kg)   Stephens, MS, RD, LDN Office: [phone] Pager: [phone] After Hours/Weekend Pager: [phone]  

## 2017-03-19 NOTE — Clinical Social Work Note (Signed)
CSW received a call concerning this patient from his attending RN. According to the chart, the patient has support from his granddaughter, and she is aware of his needs. The RNCM is aware as well. Should the patient discharge, he may benefit from addition of home health SW, RN, and Aide to assist with the situation. Otherwise, there is no indication that the patient would benefit from SNF or CSW assistance. CSW is signing off. Please consult should needs arise.  Christian PonderKaren Martha Jesse [name], MSW, Theresia MajorsLCSWA [phone]

## 2017-03-19 NOTE — Progress Notes (Signed)
Sat in chair about 2.5 hrs this shift & walked in halls x1. Appetite ok, has drunk 2 Ensures this shift (strawberry).

## 2017-03-19 NOTE — Progress Notes (Signed)
Up to chair for lunch earlier, required small amount assist from safety standpoint.   Ate all of lunch.

## 2017-03-19 NOTE — Progress Notes (Signed)
[facility] - [place] [facility]   [facility] RiserClarence Mares    MR#:  [id number]  DATE OF BIRTH:  11/19/1940  SUBJECTIVE: Admitted for UTI.,  Hyponatremia.  Patient alert, awake.no complaints.  Sodium improved from 120-128.  CHIEF COMPLAINT:   Chief Complaint  Patient presents with  . Urinary Retention    REVIEW OF SYSTEMS:   ROS CONSTITUTIONAL: No fever, fatigue or weakness.  EYES: No blurred or double vision.  EARS, NOSE, AND THROAT: No tinnitus or ear pain.  RESPIRATORY: No cough, shortness of breath, wheezing or hemoptysis.  CARDIOVASCULAR: No chest pain, orthopnea, edema.  GASTROINTESTINAL: No nausea, vomiting, diarrhea or abdominal pain.  GENITOURINARY: No dysuria, hematuria.  ENDOCRINE: No polyuria, nocturia,  HEMATOLOGY: No anemia, easy bruising or bleeding SKIN: No rash or lesion. MUSCULOSKELETAL:  right knee pain  NEUROLOGIC: No tingling, numbness, weakness.  PSYCHIATRY: No anxiety or depression.   DRUG ALLERGIES:  No Known Allergies  VITALS:  Blood pressure (!) 103/45, pulse (!) 123, temperature (!) 96.6 F (35.9 C), temperature source Axillary, resp. rate 16, height 5\' 9"  (1.753 m), weight 52.6 kg (116 lb), SpO2 100 %.  PHYSICAL EXAMINATION:  GENERAL:  84 y.o.-year-old patient lying in the bed with no acute distress.  EYES: Pupils equal, round, reactive to light and accommodation. No scleral icterus. Extraocular muscles intact.  HEENT: Head atraumatic, normocephalic. Oropharynx and nasopharynx clear.  NECK:  Supple, no jugular venous distention. No thyroid enlargement, no tenderness.  LUNGS: Normal breath sounds bilaterally, no wheezing, rales,rhonchi or crepitation. No use of accessory muscles of respiration.  CARDIOVASCULAR: S1, S2 normal. No murmurs, rubs, or gallops.  ABDOMEN: Soft, nontender, nondistended. Bowel sounds present. No organomegaly or mass.  EXTREMITIES: No pedal edema, cyanosis, or clubbing.  NEUROLOGIC:  Cranial nerves II through XII are intact. Muscle strength 5/5 in all extremities. Sensation intact. Gait not checked.  PSYCHIATRIC: The patient is alert and oriented x 3.  SKIN: No obvious rash, lesion, or ulcer.    LABORATORY PANEL:   CBC Recent Labs  Lab 03/18/17 0710  WBC 7.5  HGB 9.6*  HCT 27.5*  PLT 223   ------------------------------------------------------------------------------------------------------------------  Chemistries  Recent Labs  Lab 03/19/17 0654  NA 128*  K 3.8  CL 99*  CO2 24  GLUCOSE 104*  BUN 12  CREATININE 0.70  CALCIUM 8.3*   ------------------------------------------------------------------------------------------------------------------  Cardiac Enzymes No results for input(s): TROPONINI in the last 168 hours. ------------------------------------------------------------------------------------------------------------------  RADIOLOGY:  No results found.  EKG:   Orders placed or performed during the hospital encounter of 10/21/16  . ED EKG  . ED EKG  . EKG 12-Lead  . EKG 12-Lead  . Repeat EKG  . Repeat EKG    ASSESSMENT AND PLAN:   Acute hyponatremia secondary to dehydration: Improving with IV fluids, sodium is up from 120-126.-128  Continue IV fluids, check frequent Chem-7's. 2.  UTI: With Klebsiella pending susceptibility results.  On Rocephin at this time.  likely discharge tomorrow home ,family can pick him at 5 PM  . 3.  Urinary retention secondary to massive BPH.  [facility], talking about having chronic Foley/CIC.  Discussed with granddaughter.  Continue Flomax, Proscar. 4.  Essential hypertension: Controlled.     All the records are reviewed and case discussed with Care Management/Social Workerr. Management plans discussed with the patient, family and they are in agreement.  CODE STATUS:full  TOTAL TIME TAKING CARE OF THIS PATIENT: 35 minutes.   POSSIBLE D/C IN  1-2DAYS, DEPENDING ON  CLINICAL CONDITION.   Katha Hamming M.D on 03/19/2017 at 3:22 PM  Between 7am to 6pm - Pager - [phone]  After 6pm go to [email or web address] - password [email or web address] [facility]  [place] [facility]  Office  [phone]  CC: Primary care physician; Lauro Regulus, MD   Note: This dictation was prepared with Dragon dictation along with smaller phrase technology. Any transcriptional errors that result from this process are unintentional.

## 2017-03-20 DIAGNOSIS — E43 Unspecified severe protein-calorie malnutrition: Secondary | ICD-10-CM

## 2017-03-20 LAB — URINE CULTURE

## 2017-03-20 LAB — GLUCOSE, CAPILLARY
GLUCOSE-CAPILLARY: 105 mg/dL — AB (ref 65–99)
GLUCOSE-CAPILLARY: 165 mg/dL — AB (ref 65–99)
GLUCOSE-CAPILLARY: 184 mg/dL — AB (ref 65–99)

## 2017-03-20 LAB — BASIC METABOLIC PANEL
ANION GAP: 6 (ref 5–15)
BUN: 13 mg/dL (ref 6–20)
CALCIUM: 8.2 mg/dL — AB (ref 8.9–10.3)
CO2: 25 mmol/L (ref 22–32)
Chloride: 99 mmol/L — ABNORMAL LOW (ref 101–111)
Creatinine, Ser: 0.58 mg/dL — ABNORMAL LOW (ref 0.61–1.24)
Glucose, Bld: 104 mg/dL — ABNORMAL HIGH (ref 65–99)
POTASSIUM: 4.1 mmol/L (ref 3.5–5.1)
SODIUM: 130 mmol/L — AB (ref 135–145)

## 2017-03-20 MED ORDER — LISINOPRIL 10 MG PO TABS: 10.0000 mg | ORAL_TABLET | Freq: Every day | ORAL | Status: DC

## 2017-03-20 MED ORDER — LISINOPRIL 10 MG PO TABS: 10.0000 mg | ORAL_TABLET | Freq: Every day | ORAL | 0 refills | Status: DC

## 2017-03-20 MED ORDER — CIPROFLOXACIN HCL 500 MG PO TABS: 500.0000 mg | ORAL_TABLET | Freq: Two times a day (BID) | ORAL | 0 refills | Status: AC

## 2017-03-20 NOTE — Care Management (Signed)
Patient admitted from home with UTI. Patient lives at home alone.  PCP Dareen PianoAnderson.  Pharmacy [facility].  Patient states that he has 2 walkers and a cane in the home.  Home health orders have been place for PT and SW.  Patient asked that I speak with his granddaughter [name] about setting up services.  Voicemail left, awaiting return call.

## 2017-03-20 NOTE — Discharge Instructions (Addendum)
° °  Acute Urinary Retention, Male Acute urinary retention is when you are unable to pee (urinate). Acute urinary retention is common in older men. Prostates can get bigger, which blocks the flow of pee. Follow these instructions at home:  Drink enough fluids to keep your pee clear or pale yellow.  If you are sent home with a tube that drains the bladder (catheter), there will be a drainage bag attached to it. There are two types of bags. One is big that you can wear at night without having to empty it. One is smaller and needs to be emptied more often. ? Keep the drainage bag empty. ? Keep the drainage bag lower than your catheter.  Only take medicine as told by your doctor. Contact a doctor if:  You have a low-grade fever.  You have spasms or you are leaking pee when you have spasms. Get help right away if:  You have chills or a fever.  Your catheter stops draining pee.  Your catheter falls out.  You have increased bleeding that does not stop after you have rested and increased the amount of fluids you had been drinking. This information is not intended to replace advice given to you by your health care provider. Make sure you discuss any questions you have with your health care provider. Document Released: 08/10/2007 Document Revised: 07/30/2015 Document Reviewed: 08/02/2012 Elsevier Interactive Patient Education  2017 [employer] Resume diet and activity as before

## 2017-03-20 NOTE — Progress Notes (Signed)
Pharmacy Antibiotic Note  Christian LarocheClarence J [name] is a 84 y.o. male admitted on 03/17/2017 with UTI.  Pharmacy consulted for ceftriaxone dosing.  Plan: Ceftriaxone 2 grams q 24 hours ordered  Height: 5\' 9"  (175.3 cm) Weight: 116 lb (52.6 kg) IBW/kg (Calculated) : 70.7  Temp (24hrs), Avg:98 F (36.7 C), Min:96.6 F (35.9 C), Max:98.7 F (37.1 C)  Recent Labs  Lab 03/17/17 1859 03/18/17 0710 03/19/17 0654 03/20/17 0330  WBC 7.7 7.5  --   --   CREATININE 0.82 0.72 0.70 0.58*    Estimated Creatinine Clearance: 47.5 mL/min (A) (by C-G formula based on SCr of 0.58 mg/dL (L)).    No Known Allergies  Antimicrobials this admission: Ceftriaxone 1/11  >>    >>   Dose adjustments this admission:   Microbiology results: 1/11 UCx: pending       1/11 UA: LE(+) NO2(-)  WBC TNTC Thank you for allowing pharmacy to be a part of this patient's care.  Ashli Selders D 03/20/2017 1:34 PM

## 2017-03-20 NOTE — Care Management Important Message (Signed)
Important Message  Patient Details  Name: [name] MRN: [id number] Date of Birth: 10/22/1926   Medicare Important Message Given:  Yes    Chapman FitchBOWEN, Tremayne Sheldon T, RN 03/20/2017, 12:10 PM

## 2017-03-20 NOTE — Progress Notes (Signed)
IV was removed. Discharge instructions and follow-up appointments were provided to the pt. All questions answered. Foley catheter was switched out to a leg bag for pt.  The pt is waiting for his granddaughter to arrive to transport him home.

## 2017-03-20 NOTE — Care Management (Signed)
Return call received from granddaughter [name].  Per [name] at baseline patient is independent and still drive when he is able.  She is agreeable to home health services.  Home health agency preference provided.  She did not have a preference of agency.  [facility].  RNCM signing off.

## 2017-03-22 NOTE — Discharge Summary (Signed)
[facility] [place] [facility]   PATIENT NAME: [name]    MR#:  [id number]  DATE OF BIRTH:  10/20/1926  DATE OF ADMISSION:  03/17/2017 ADMITTING PHYSICIAN: Oralia Manisavid Willis, MD  DATE OF DISCHARGE: 03/20/2017  6:07 PM  PRIMARY CARE PHYSICIAN: [name], [name], MD   ADMISSION DIAGNOSIS:  Urinary retention [R33.9] Hyponatremia [E87.1]  DISCHARGE DIAGNOSIS:  Principal Problem:   UTI (urinary tract infection) Active Problems:   Hyponatremia   BPH with obstruction/lower urinary tract symptoms   Diabetes (HCC)   HTN (hypertension)   GERD (gastroesophageal reflux disease)   HLD (hyperlipidemia)   Protein-calorie malnutrition, severe   SECONDARY DIAGNOSIS:   Past Medical History:  Diagnosis Date  . BPH (benign prostatic hyperplasia)   . Diabetes mellitus without complication (HCC)   . GERD (gastroesophageal reflux disease)   . HLD (hyperlipidemia)   . Hypertension      ADMITTING HISTORY  HISTORY OF PRESENT ILLNESS:  Christian RiserClarence Delcarlo  is a 84 y.o. male who presents with urinary retention weakness.  Patient has had a Foley in place for the past 5 weeks due to BPH with lower urinary obstruction.  He had it removed yesterday, and all day today has only been able to pass very small amounts of urine.  He is also had progressive weakness including some falls over the past couple of days.  Here in the ED he was found to have UTI, hyponatremia, and persistent urinary retention.  Hospitalist were called for admission  HOSPITAL COURSE:   *Acute hyponatremia secondary to dehydration with weakness.  This has improved.  Sodium has improved from 120-130. Does have mild chronic hyponatremia.  IV fluids stopped.  Hydrochlorothiazide discontinued. *Klebsiella UTI.  Treated with IV Rocephin in the hospital and changed to oral antibiotics at discharge.  Afebrile.  Normal WBC. *Urinary retention due to benign prostatic hypertrophy.  Foley catheter placed.  Seen by  urology.  Advised outpatient follow-up in the urology clinic in 3 weeks. *Essential hypertension is well controlled.  Patient stable for discharge back home.  Prescription sent to pharmacy.  CONSULTS OBTAINED:  Treatment Team:  Malen GauzeMcKenzie, Patrick L, MD  DRUG ALLERGIES:  No Known Allergies  DISCHARGE MEDICATIONS:   Allergies as of 03/20/2017   No Known Allergies     Medication List    STOP taking these medications   lisinopril-hydrochlorothiazide 20-12.5 MG tablet Commonly known as:  PRINZIDE,ZESTORETIC     TAKE these medications   atorvastatin 40 MG tablet Commonly known as:  LIPITOR Take 20 mg by mouth daily.   ciprofloxacin 500 MG tablet Commonly known as:  CIPRO Take 1 tablet (500 mg total) by mouth 2 (two) times daily for 5 days.   finasteride 5 MG tablet Commonly known as:  PROSCAR Take 1 tablet (5 mg total) by mouth daily.   INTEGRA 62.5-62.5-40-3 MG Caps Take 1 capsule by mouth daily.   latanoprost 0.005 % ophthalmic solution Commonly known as:  XALATAN Place 1 drop into both eyes at bedtime.   lisinopril 10 MG tablet Commonly known as:  PRINIVIL,ZESTRIL Take 1 tablet (10 mg total) by mouth daily.   omeprazole 40 MG capsule Commonly known as:  PRILOSEC Take 40 mg by mouth daily.   tamsulosin 0.4 MG Caps capsule Commonly known as:  FLOMAX Take 1 capsule (0.4 mg total) by mouth daily.       Today   VITAL SIGNS:  Blood pressure (!) 113/48, pulse 66, temperature 98.5 F (36.9 C), temperature source  Axillary, resp. rate 14, height 5\' 9"  (1.753 m), weight 52.6 kg (116 lb), SpO2 100 %.  I/O:  No intake or output data in the 24 hours ending 03/22/17 1502  PHYSICAL EXAMINATION:  Physical Exam  GENERAL:  84 y.o.-year-old patient lying in the bed with no acute distress.  LUNGS: Normal breath sounds bilaterally, no wheezing, rales,rhonchi or crepitation. No use of accessory muscles of respiration.  CARDIOVASCULAR: S1, S2 normal. No murmurs, rubs, or  gallops.  ABDOMEN: Soft, non-tender, non-distended. Bowel sounds present. No organomegaly or mass.  NEUROLOGIC: Moves all 4 extremities. PSYCHIATRIC: The patient is alert and oriented x 3.  SKIN: No obvious rash, lesion, or ulcer.   DATA REVIEW:   CBC Recent Labs  Lab 03/18/17 0710  WBC 7.5  HGB 9.6*  HCT 27.5*  PLT 223    Chemistries  Recent Labs  Lab 03/20/17 0330  NA 130*  K 4.1  CL 99*  CO2 25  GLUCOSE 104*  BUN 13  CREATININE 0.58*  CALCIUM 8.2*    Cardiac Enzymes No results for input(s): TROPONINI in the last 168 hours.  Microbiology Results  Results for orders placed or performed during the hospital encounter of 03/17/17  Urine culture     Status: Abnormal   Collection Time: 03/17/17  6:59 PM  Result Value Ref Range Status   Specimen Description   Final    URINE, RANDOM Performed at [facility], [address], [place], [place] [address]    Special Requests   Final    NONE Performed at [facility], [address] [place], [place] [address]    Culture >=100,000 COLONIES/mL KLEBSIELLA OXYTOCA (A)  Final   Report Status 03/20/2017 FINAL  Final   Organism ID, Bacteria KLEBSIELLA OXYTOCA (A)  Final      Susceptibility   Klebsiella oxytoca - MIC*    AMPICILLIN >=32 RESISTANT Resistant     CEFAZOLIN <=4 SENSITIVE Sensitive     CEFTRIAXONE <=1 SENSITIVE Sensitive     CIPROFLOXACIN <=0.25 SENSITIVE Sensitive     GENTAMICIN <=1 SENSITIVE Sensitive     IMIPENEM <=0.25 SENSITIVE Sensitive     NITROFURANTOIN 32 SENSITIVE Sensitive     TRIMETH/SULFA <=20 SENSITIVE Sensitive     AMPICILLIN/SULBACTAM 4 SENSITIVE Sensitive     PIP/TAZO <=4 SENSITIVE Sensitive     Extended ESBL NEGATIVE Sensitive     * >=100,000 COLONIES/mL KLEBSIELLA OXYTOCA    RADIOLOGY:  No results found.  Follow up with PCP in 1 week.  Management plans discussed with the patient, family and they are in agreement.  CODE STATUS:  Code Status History    Date  Active Date Inactive Code Status Order ID Comments User Context   03/17/2017 22:50 03/20/2017 21:12 Full Code [id number]  Oralia Manis, MD Inpatient    Advance Directive Documentation     Most Recent Value  Type of Advance Directive  Healthcare Power of Attorney  Pre-existing out of facility DNR order (yellow form or pink MOST form)  No data  "MOST" Form in Place?  No data      TOTAL TIME TAKING CARE OF THIS PATIENT ON DAY OF DISCHARGE: more than 30 minutes.   Orie Fisherman M.D on 03/22/2017 at 3:02 PM  Between 7am to 6pm - Pager - [phone]  After 6pm go to [email or web address] - password [email or web address] [facility]  [facility] [place] [facility]  Office  [phone]  CC: Primary care physician; Lauro Regulus, MD  Note: This dictation was prepared  with Dragon dictation along with smaller phrase technology. Any transcriptional errors that result from this process are unintentional.

## 2017-03-26 ENCOUNTER — Encounter: Payer: Self-pay | Admitting: Urology

## 2017-04-06 ENCOUNTER — Encounter: Payer: Self-pay | Admitting: Emergency Medicine

## 2017-04-06 ENCOUNTER — Other Ambulatory Visit: Payer: Self-pay

## 2017-04-06 ENCOUNTER — Observation Stay
Admission: EM | Admit: 2017-04-06 | Discharge: 2017-04-07 | Disposition: A | Discharge status: Home / Self Care | Payer: Medicare Other | Attending: Internal Medicine | Admitting: Internal Medicine

## 2017-04-06 DIAGNOSIS — B9689 Other specified bacterial agents as the cause of diseases classified elsewhere: Secondary | ICD-10-CM | POA: Diagnosis not present

## 2017-04-06 DIAGNOSIS — E861 Hypovolemia: Secondary | ICD-10-CM | POA: Insufficient documentation

## 2017-04-06 DIAGNOSIS — N138 Other obstructive and reflux uropathy: Secondary | ICD-10-CM | POA: Diagnosis not present

## 2017-04-06 DIAGNOSIS — Z79899 Other long term (current) drug therapy: Secondary | ICD-10-CM | POA: Insufficient documentation

## 2017-04-06 DIAGNOSIS — E43 Unspecified severe protein-calorie malnutrition: Secondary | ICD-10-CM | POA: Insufficient documentation

## 2017-04-06 DIAGNOSIS — Z8042 Family history of malignant neoplasm of prostate: Secondary | ICD-10-CM | POA: Diagnosis not present

## 2017-04-06 DIAGNOSIS — R339 Retention of urine, unspecified: Secondary | ICD-10-CM | POA: Insufficient documentation

## 2017-04-06 DIAGNOSIS — R197 Diarrhea, unspecified: Secondary | ICD-10-CM | POA: Diagnosis not present

## 2017-04-06 DIAGNOSIS — E119 Type 2 diabetes mellitus without complications: Secondary | ICD-10-CM | POA: Diagnosis not present

## 2017-04-06 DIAGNOSIS — Z833 Family history of diabetes mellitus: Secondary | ICD-10-CM | POA: Diagnosis not present

## 2017-04-06 DIAGNOSIS — Z23 Encounter for immunization: Secondary | ICD-10-CM | POA: Insufficient documentation

## 2017-04-06 DIAGNOSIS — Z1629 Resistance to other single specified antibiotic: Secondary | ICD-10-CM | POA: Diagnosis not present

## 2017-04-06 DIAGNOSIS — N401 Enlarged prostate with lower urinary tract symptoms: Secondary | ICD-10-CM | POA: Insufficient documentation

## 2017-04-06 DIAGNOSIS — I10 Essential (primary) hypertension: Secondary | ICD-10-CM | POA: Insufficient documentation

## 2017-04-06 DIAGNOSIS — N390 Urinary tract infection, site not specified: Secondary | ICD-10-CM | POA: Diagnosis present

## 2017-04-06 DIAGNOSIS — Z794 Long term (current) use of insulin: Secondary | ICD-10-CM | POA: Insufficient documentation

## 2017-04-06 DIAGNOSIS — Z8249 Family history of ischemic heart disease and other diseases of the circulatory system: Secondary | ICD-10-CM | POA: Insufficient documentation

## 2017-04-06 DIAGNOSIS — N3000 Acute cystitis without hematuria: Secondary | ICD-10-CM | POA: Diagnosis not present

## 2017-04-06 DIAGNOSIS — K219 Gastro-esophageal reflux disease without esophagitis: Secondary | ICD-10-CM | POA: Insufficient documentation

## 2017-04-06 DIAGNOSIS — E871 Hypo-osmolality and hyponatremia: Secondary | ICD-10-CM | POA: Diagnosis not present

## 2017-04-06 DIAGNOSIS — E785 Hyperlipidemia, unspecified: Secondary | ICD-10-CM | POA: Insufficient documentation

## 2017-04-06 DIAGNOSIS — R42 Dizziness and giddiness: Secondary | ICD-10-CM | POA: Diagnosis not present

## 2017-04-06 LAB — CBC
HEMATOCRIT: 27.7 % — AB (ref 40.0–52.0)
Hemoglobin: 9.8 g/dL — ABNORMAL LOW (ref 13.0–18.0)
MCH: 32 pg (ref 26.0–34.0)
MCHC: 35.5 g/dL (ref 32.0–36.0)
MCV: 90.1 fL (ref 80.0–100.0)
Platelets: 296 10*3/uL (ref 150–440)
RBC: 3.07 MIL/uL — ABNORMAL LOW (ref 4.40–5.90)
RDW: 14.1 % (ref 11.5–14.5)
WBC: 4.4 10*3/uL (ref 3.8–10.6)

## 2017-04-06 LAB — URINALYSIS, COMPLETE (UACMP) WITH MICROSCOPIC
BACTERIA UA: NONE SEEN
Bilirubin Urine: NEGATIVE
GLUCOSE, UA: NEGATIVE mg/dL
Ketones, ur: NEGATIVE mg/dL
NITRITE: NEGATIVE
PROTEIN: 30 mg/dL — AB
Specific Gravity, Urine: 1.003 — ABNORMAL LOW (ref 1.005–1.030)
Squamous Epithelial / LPF: NONE SEEN
pH: 6 (ref 5.0–8.0)

## 2017-04-06 LAB — BASIC METABOLIC PANEL
Anion gap: 9 (ref 5–15)
BUN: 12 mg/dL (ref 6–20)
CALCIUM: 8.2 mg/dL — AB (ref 8.9–10.3)
CO2: 20 mmol/L — ABNORMAL LOW (ref 22–32)
Chloride: 92 mmol/L — ABNORMAL LOW (ref 101–111)
Creatinine, Ser: 0.71 mg/dL (ref 0.61–1.24)
GFR calc Af Amer: >60 mL/min (ref >60)
GLUCOSE: 145 mg/dL — AB (ref 65–99)
Potassium: 3.3 mmol/L — ABNORMAL LOW (ref 3.5–5.1)
Sodium: 121 mmol/L — ABNORMAL LOW (ref 135–145)

## 2017-04-06 LAB — GLUCOSE, CAPILLARY
Glucose-Capillary: 136 mg/dL — ABNORMAL HIGH (ref 65–99)
Glucose-Capillary: 79 mg/dL (ref 65–99)

## 2017-04-06 LAB — TROPONIN I: Troponin I: <0.03 ng/mL (ref <0.03)

## 2017-04-06 MED ORDER — SENNOSIDES-DOCUSATE SODIUM 8.6-50 MG PO TABS: 1.0000 | ORAL_TABLET | Freq: Every evening | ORAL | Status: DC | PRN

## 2017-04-06 MED ORDER — POTASSIUM CHLORIDE CRYS ER 20 MEQ PO TBCR
40.0000 meq | EXTENDED_RELEASE_TABLET | Freq: Once | ORAL | Status: AC
  Administered 2017-04-06: 40 meq via ORAL
  Filled 2017-04-06: qty 2

## 2017-04-06 MED ORDER — BISACODYL 5 MG PO TBEC: 5.0000 mg | DELAYED_RELEASE_TABLET | Freq: Every day | ORAL | Status: DC | PRN

## 2017-04-06 MED ORDER — PANTOPRAZOLE SODIUM 40 MG PO TBEC
40.0000 mg | DELAYED_RELEASE_TABLET | Freq: Every day | ORAL | Status: DC
  Administered 2017-04-06 – 2017-04-07 (×2): 40 mg via ORAL
  Filled 2017-04-06 (×2): qty 1

## 2017-04-06 MED ORDER — FINASTERIDE 5 MG PO TABS
5.0000 mg | ORAL_TABLET | Freq: Every day | ORAL | Status: DC
  Administered 2017-04-06 – 2017-04-07 (×2): 5 mg via ORAL
  Filled 2017-04-06 (×2): qty 1

## 2017-04-06 MED ORDER — ALBUTEROL SULFATE (2.5 MG/3ML) 0.083% IN NEBU: 2.5000 mg | INHALATION_SOLUTION | RESPIRATORY_TRACT | Status: DC | PRN

## 2017-04-06 MED ORDER — PNEUMOCOCCAL VAC POLYVALENT 25 MCG/0.5ML IJ INJ
0.5000 mL | INJECTION | INTRAMUSCULAR | Status: AC
  Administered 2017-04-07: 0.5 mL via INTRAMUSCULAR
  Filled 2017-04-06 (×2): qty 0.5

## 2017-04-06 MED ORDER — DEXTROSE 5 % IV SOLN
1.0000 g | Freq: Once | INTRAVENOUS | Status: AC
  Administered 2017-04-06: 1 g via INTRAVENOUS
  Filled 2017-04-06: qty 10

## 2017-04-06 MED ORDER — ONDANSETRON HCL 4 MG PO TABS: 4.0000 mg | ORAL_TABLET | Freq: Four times a day (QID) | ORAL | Status: DC | PRN

## 2017-04-06 MED ORDER — ENOXAPARIN SODIUM 40 MG/0.4ML ~~LOC~~ SOLN
40.0000 mg | SUBCUTANEOUS | Status: DC
  Administered 2017-04-06: 40 mg via SUBCUTANEOUS
  Filled 2017-04-06: qty 0.4

## 2017-04-06 MED ORDER — TAMSULOSIN HCL 0.4 MG PO CAPS
0.4000 mg | ORAL_CAPSULE | Freq: Every day | ORAL | Status: DC
  Administered 2017-04-06 – 2017-04-07 (×2): 0.4 mg via ORAL
  Filled 2017-04-06 (×2): qty 1

## 2017-04-06 MED ORDER — ACETAMINOPHEN 325 MG PO TABS: 650.0000 mg | ORAL_TABLET | Freq: Four times a day (QID) | ORAL | Status: DC | PRN

## 2017-04-06 MED ORDER — INSULIN ASPART 100 UNIT/ML ~~LOC~~ SOLN
0.0000 [IU] | Freq: Three times a day (TID) | SUBCUTANEOUS | Status: DC
  Administered 2017-04-06: 1 [IU] via SUBCUTANEOUS
  Administered 2017-04-07: 3 [IU] via SUBCUTANEOUS
  Filled 2017-04-06 (×2): qty 1

## 2017-04-06 MED ORDER — LISINOPRIL 10 MG PO TABS
10.0000 mg | ORAL_TABLET | Freq: Every day | ORAL | Status: DC
  Administered 2017-04-06 – 2017-04-07 (×2): 10 mg via ORAL
  Filled 2017-04-06 (×2): qty 1

## 2017-04-06 MED ORDER — DEXTROSE 5 % IV SOLN
1.0000 g | INTRAVENOUS | Status: DC
  Filled 2017-04-06: qty 10

## 2017-04-06 MED ORDER — INSULIN ASPART 100 UNIT/ML ~~LOC~~ SOLN: 0.0000 [IU] | Freq: Every day | SUBCUTANEOUS | Status: DC

## 2017-04-06 MED ORDER — ATORVASTATIN CALCIUM 20 MG PO TABS
20.0000 mg | ORAL_TABLET | Freq: Every day | ORAL | Status: DC
  Administered 2017-04-06 – 2017-04-07 (×2): 20 mg via ORAL
  Filled 2017-04-06 (×2): qty 1

## 2017-04-06 MED ORDER — HYDROCODONE-ACETAMINOPHEN 5-325 MG PO TABS: 1.0000 | ORAL_TABLET | ORAL | Status: DC | PRN

## 2017-04-06 MED ORDER — LATANOPROST 0.005 % OP SOLN
1.0000 [drp] | Freq: Every day | OPHTHALMIC | Status: DC
  Administered 2017-04-06: 1 [drp] via OPHTHALMIC
  Filled 2017-04-06: qty 2.5

## 2017-04-06 MED ORDER — ONDANSETRON HCL 4 MG/2ML IJ SOLN: 4.0000 mg | Freq: Four times a day (QID) | INTRAMUSCULAR | Status: DC | PRN

## 2017-04-06 MED ORDER — SODIUM CHLORIDE 0.9 % IV BOLUS (SEPSIS)
1000.0000 mL | Freq: Once | INTRAVENOUS | Status: AC
  Administered 2017-04-06: 1000 mL via INTRAVENOUS

## 2017-04-06 MED ORDER — SODIUM CHLORIDE 0.9 % IV SOLN
INTRAVENOUS | Status: DC
  Administered 2017-04-06: 16:00:00 via INTRAVENOUS

## 2017-04-06 MED ORDER — ACETAMINOPHEN 650 MG RE SUPP: 650.0000 mg | Freq: Four times a day (QID) | RECTAL | Status: DC | PRN

## 2017-04-06 NOTE — ED Provider Notes (Signed)
[facility] Emergency Department Provider Note    ____________________________________________   I have reviewed the triage vital signs and the nursing notes.   HISTORY  Chief Complaint Dizziness; Fatigue; and Diarrhea   History limited by: Not Limited   HPI [name] is a 84 y.o. male who presents to the emergency department today of concerns for weakness, dizziness and some diarrhea.  The patient started having the symptoms a few days ago.  Actually had blood work done on Monday.  He states that he was told by his doctor to drink Powerade which she has done.  He feels like this has helped the diarrhea that he has had less diarrhea since starting that.  However he has continued to feel dizzy and weak.  He denies any fevers.  No chest pain or shortness of breath.   Per medical record review patient has a history of recent admission for UTI and hyponatremia.   Past Medical History:  Diagnosis Date  . BPH (benign prostatic hyperplasia)   . Diabetes mellitus without complication (HCC)   . GERD (gastroesophageal reflux disease)   . HLD (hyperlipidemia)   . Hypertension     Patient Active Problem List   Diagnosis Date Noted  . Protein-calorie malnutrition, severe 03/20/2017  . UTI (urinary tract infection) 03/17/2017  . Hyponatremia 03/17/2017  . BPH with obstruction/lower urinary tract symptoms 03/17/2017  . Diabetes (HCC) 03/17/2017  . HTN (hypertension) 03/17/2017  . GERD (gastroesophageal reflux disease) 03/17/2017  . HLD (hyperlipidemia) 03/17/2017    Past Surgical History:  Procedure Laterality Date  . HIP FRACTURE SURGERY    . KNEE SURGERY      Prior to Admission medications   Medication Sig Start Date End Date Taking? Authorizing Provider  atorvastatin (LIPITOR) 40 MG tablet Take 20 mg by mouth daily. 09/27/16   [provider]  Fe Fum-FePoly-Vit C-Vit B3 (INTEGRA) 62.5-62.5-40-3 MG CAPS Take 1 capsule by mouth daily.  10/12/16   [provider]  finasteride (PROSCAR) 5 MG tablet Take 1 tablet (5 mg total) by mouth daily. 02/15/17   Michiel CowboyMcGowan, Shannon A, PA-C  latanoprost (XALATAN) 0.005 % ophthalmic solution Place 1 drop into both eyes at bedtime.    [provider]  lisinopril (PRINIVIL,ZESTRIL) 10 MG tablet Take 1 tablet (10 mg total) by mouth daily. 03/20/17 04/19/17  Milagros LollSudini, Srikar, MD  omeprazole (PRILOSEC) 40 MG capsule Take 40 mg by mouth daily. 09/27/16   [provider]  tamsulosin (FLOMAX) 0.4 MG CAPS capsule Take 1 capsule (0.4 mg total) by mouth daily. 02/15/17   Michiel CowboyMcGowan, Shannon A, PA-C    Allergies Patient has no known allergies.  Family History  Problem Relation Age of Onset  . Diabetes Father   . Hypertension Father   . Prostate cancer Father     Social History Social History   Tobacco Use  . Smoking status: Never Smoker  . Smokeless tobacco: Never Used  Substance Use Topics  . Alcohol use: No  . Drug use: No    Review of Systems Constitutional: Positive for generalized weakness. Eyes: No visual changes. ENT: No sore throat. Cardiovascular: Denies chest pain. Respiratory: Denies shortness of breath. Gastrointestinal: No abdominal pain.  No nausea, no vomiting.  No diarrhea.   Genitourinary: Negative for dysuria. Musculoskeletal: Negative for back pain. Skin: Negative for rash. Neurological: Positive for dizziness.  ____________________________________________   PHYSICAL EXAM:  VITAL SIGNS: ED Triage Vitals  Enc Vitals Group     BP 04/06/17 1114  122/62     Pulse Rate 04/06/17 1114 80     Resp 04/06/17 1114 16     Temp --      Temp src --      SpO2 04/06/17 1114 100 %     Weight 04/06/17 1115 116 lb (52.6 kg)     Height 04/06/17 1115 5\' 9"  (1.753 m)     Head Circumference --      Peak Flow --      Pain Score 04/06/17 1113 7   Constitutional: Alert and oriented. Well appearing and in no distress. Eyes: Conjunctivae are normal.  ENT    Head: Normocephalic and atraumatic.   Nose: No congestion/rhinnorhea.   Mouth/Throat: Mucous membranes are moist.   Neck: No stridor. Hematological/Lymphatic/Immunilogical: No cervical lymphadenopathy. Cardiovascular: Normal rate, regular rhythm.  No murmurs, rubs, or gallops.  Respiratory: Normal respiratory effort without tachypnea nor retractions. Breath sounds are clear and equal bilaterally. No wheezes/rales/rhonchi. Gastrointestinal: Soft and non tender. No rebound. No guarding.  Genitourinary: Deferred Musculoskeletal: Normal range of motion in all extremities. Bilateral pitting edema.  Neurologic:  Normal speech and language. No gross focal neurologic deficits are appreciated.  Skin:  Skin is warm, dry and intact. No rash noted. Psychiatric: Mood and affect are normal. Speech and behavior are normal. Patient exhibits appropriate insight and judgment.  ____________________________________________    LABS (pertinent positives/negatives)  CBC wbc 4.4, hgb 9.8, plt 296 Trop <0.03 BMP na 121, k 3.3, glu 145 UA large leukocytes, too numerous to count WBC  ____________________________________________   EKG  I, Phineas Semen, attending physician, personally viewed and interpreted this EKG  EKG Time: 1116 Rate: 79 Rhythm: sinus rhythm Axis: normal Intervals: qtc 433 QRS: narrow ST changes: no st elevation Impression: normal ekg   ____________________________________________    RADIOLOGY  None  ____________________________________________   PROCEDURES  Procedures  ____________________________________________   INITIAL IMPRESSION / ASSESSMENT AND PLAN / ED COURSE  Pertinent labs & imaging results that were available during my care of the patient were reviewed by me and considered in my medical decision making (see chart for details).  Patient presented to the emergency department today because of concerns for dizziness, weakness and some  diarrhea.  Differential would be broad including infection, electrolyte cancer, hypoxia, malnutrition amongst other differentials.  Workup was concerning for significant hyponatremia as well as findings consistent with a urinary tract infection.  Of note patient had a hospitalization recently for similar findings.  Will start IV fluids and antibiotics.  Discussed findings and plan with patient and family.  Will admit to the hospital service.   ____________________________________________   FINAL CLINICAL IMPRESSION(S) / ED DIAGNOSES  Final diagnoses:  Hyponatremia  Lower urinary tract infectious disease     Note: This dictation was prepared with Dragon dictation. Any transcriptional errors that result from this process are unintentional     Phineas Semen, MD 04/06/17 1325

## 2017-04-06 NOTE — Progress Notes (Signed)
Pharmacy Antibiotic Note  Christian LarocheClarence J [name] is a 84 y.o. male admitted on 04/06/2017 with UTI.  Pharmacy has been consulted for ceftriaxone dosing.  Plan: CTX 1 g iv q 24 hours.   Height: 5\' 9"  (175.3 cm) Weight: 116 lb (52.6 kg) IBW/kg (Calculated) : 70.7  No data recorded.  Recent Labs  Lab 04/06/17 1208  WBC 4.4  CREATININE 0.71    Estimated Creatinine Clearance: 47.5 mL/min (by C-G formula based on SCr of 0.71 mg/dL).    No Known Allergies  Antimicrobials this admission: CTX 1/31 >>   Dose adjustments this admission:   Microbiology results: 1/31 UCx: sent    Thank you for allowing pharmacy to be a part of this patient's care.  Christian HartChristy, [name] 04/06/2017 2:00 PM

## 2017-04-06 NOTE — H&P (Addendum)
[facility] [employer] - [place] at [facility]   PATIENT NAME: [name]    MR#:  [id number]  DATE OF BIRTH:  12/03/1926  DATE OF ADMISSION:  04/06/2017  PRIMARY CARE PHYSICIAN: [name]   REQUESTING/REFERRING PHYSICIAN: Phineas SemenGoodman, [place], [name]  CHIEF COMPLAINT:   Chief Complaint  Patient presents with  . Dizziness  . Fatigue  . Diarrhea   Generalized weakness, dizziness and diarrhea for 5 days. HISTORY OF PRESENT ILLNESS:  [name]  is a 84 y.o. male with a known history of hypertension, hyperlipidemia, diabetes, GERD and BPH.  The patient was sent from home to the ED due to above chief complaints.  He has had diarrhea for the past 5 days.  He has had chronic Foley catheter due to BPH.  It was changed 3 weeks ago.  He was found hyponatremia with sodium 121.  Urinalysis showed UTI.  The patient is not good historian.  Information was obtained from his granddaughter.  PAST MEDICAL HISTORY:   Past Medical History:  Diagnosis Date  . BPH (benign prostatic hyperplasia)   . Diabetes mellitus without complication (HCC)   . GERD (gastroesophageal reflux disease)   . HLD (hyperlipidemia)   . Hypertension     PAST SURGICAL HISTORY:   Past Surgical History:  Procedure Laterality Date  . HIP FRACTURE SURGERY    . KNEE SURGERY      SOCIAL HISTORY:   Social History   Tobacco Use  . Smoking status: Never Smoker  . Smokeless tobacco: Never Used  Substance Use Topics  . Alcohol use: No    FAMILY HISTORY:   Family History  Problem Relation Age of Onset  . Diabetes Father   . Hypertension Father   . Prostate cancer Father     DRUG ALLERGIES:  No Known Allergies  REVIEW OF SYSTEMS:   Review of Systems  Constitutional: Positive for malaise/fatigue. Negative for chills and fever.  HENT: Negative for sore throat.   Eyes: Negative for blurred vision and double vision.  Respiratory: Negative for cough, hemoptysis, shortness of  breath, wheezing and stridor.   Cardiovascular: Positive for leg swelling. Negative for chest pain, palpitations and orthopnea.  Gastrointestinal: Negative for abdominal pain, blood in stool, diarrhea, melena, nausea and vomiting.  Genitourinary: Negative for dysuria, flank pain and hematuria.  Musculoskeletal: Negative for back pain and joint pain.  Neurological: Positive for dizziness and weakness. Negative for sensory change, focal weakness, seizures, loss of consciousness and headaches.  Endo/Heme/Allergies: Negative for polydipsia.  Psychiatric/Behavioral: Negative for depression. The patient is not nervous/anxious.     MEDICATIONS AT HOME:   Prior to Admission medications   Medication Sig Start Date End Date Taking? Authorizing Provider  atorvastatin (LIPITOR) 40 MG tablet Take 20 mg by mouth daily. 09/27/16  Yes Provider, Historical, [name]  finasteride (PROSCAR) 5 MG tablet Take 1 tablet (5 mg total) by mouth daily. 02/15/17  Yes McGowan, Carollee HerterShannon A, PA-C  furosemide (LASIX) 20 MG tablet Take 1 tablet by mouth daily. 03/29/17  Yes Provider, Historical, [name]  latanoprost (XALATAN) 0.005 % ophthalmic solution Place 1 drop into both eyes at bedtime.   Yes Provider, Historical, [name]  lisinopril (PRINIVIL,ZESTRIL) 10 MG tablet Take 1 tablet (10 mg total) by mouth daily. 03/20/17 04/19/17 Yes Sudini, Wardell HeathSrikar, [name]  omeprazole (PRILOSEC) 40 MG capsule Take 40 mg by mouth daily. 09/27/16  Yes Provider, Historical, [name]  potassium chloride (K-DUR) 10 MEQ tablet Take 1 tablet by mouth daily. 03/29/17  Yes Provider, Historical, [name]  tamsulosin (FLOMAX) 0.4 MG CAPS capsule Take 1 capsule (0.4 mg total) by mouth daily. 02/15/17  Yes McGowan, Shannon A, PA-C      VITAL SIGNS:  Blood pressure 122/62, pulse 80, resp. rate 16, height 5\' 9"  (1.753 m), weight 116 lb (52.6 kg), SpO2 100 %.  PHYSICAL EXAMINATION:  Physical Exam  GENERAL:  84 y.o.-year-old patient lying in the bed with no acute distress.  EYES: Pupils  equal, round, reactive to light and accommodation. No scleral icterus. Extraocular muscles intact.  HEENT: Head atraumatic, normocephalic. Oropharynx and nasopharynx clear.  Dry oral mucosa. NECK:  Supple, no jugular venous distention. No thyroid enlargement, no tenderness.  LUNGS: Normal breath sounds bilaterally, no wheezing, rales,rhonchi or crepitation. No use of accessory muscles of respiration.  CARDIOVASCULAR: S1, S2 normal. No murmurs, rubs, or gallops.  ABDOMEN: Soft, nontender, nondistended. Bowel sounds present. No organomegaly or mass.  EXTREMITIES: No cyanosis, or clubbing.  Bilateral leg edema 2+.  Left side is more swelling than right side. NEUROLOGIC: Cranial nerves II through XII are intact. Muscle strength 3/5 in all extremities. Sensation intact. Gait not checked.  PSYCHIATRIC: The patient is alert and oriented x 3 but looks confused.Marland Kitchen  SKIN: No obvious rash, lesion, or ulcer.   LABORATORY PANEL:   CBC Recent Labs  Lab 04/06/17 1208  WBC 4.4  HGB 9.8*  HCT 27.7*  PLT 296   ------------------------------------------------------------------------------------------------------------------  Chemistries  Recent Labs  Lab 04/06/17 1208  NA 121*  K 3.3*  CL 92*  CO2 20*  GLUCOSE 145*  BUN 12  CREATININE 0.71  CALCIUM 8.2*   ------------------------------------------------------------------------------------------------------------------  Cardiac Enzymes Recent Labs  Lab 04/06/17 1208  TROPONINI <0.03   ------------------------------------------------------------------------------------------------------------------  RADIOLOGY:  No results found.    IMPRESSION AND PLAN:   Severe hyponatremia. Hold Lasix, start normal saline IV, follow-up BMP and nephrology consult.  Hypokalemia.  Give potassium supplement and follow-up BMP and magnesium level.  UTI.  Continue Rocephin.  Diarrhea.  Check a stool GI panel and C diff test Hypertension.  Continue  home hypertension medication. Diabetes.  Start sliding scale. Anemia of chronic disease.  Stable. BPH.  Continue Flomax. Generalized weakness.  PT evaluation. All the records are reviewed and case discussed with ED provider. Management plans discussed with the patient, his granddaughter and they are in agreement.  CODE STATUS: DNR  TOTAL TIME TAKING CARE OF THIS PATIENT: 56 minutes.    Shaune Pollack M.D on 04/06/2017 at 2:32 PM  Between 7am to 6pm - Pager - [phone]  After 6pm go to [email or web address] - Scientist, research (life sciences) [place] [facility]  Office  [phone]  CC: Primary care physician; Christian Regulus, [name]   Note: This dictation was prepared with Dragon dictation along with smaller phrase technology. Any transcriptional errors that result from this process are unin

## 2017-04-06 NOTE — ED Triage Notes (Signed)
Pts granddaughter brought pt here because he has been dizzy, weak and having diarrhea for the last several days. His PMD is worried about him having C-Diff also.

## 2017-04-07 LAB — BASIC METABOLIC PANEL
ANION GAP: 7 (ref 5–15)
Anion gap: 9 (ref 5–15)
BUN: 9 mg/dL (ref 6–20)
BUN: 8 mg/dL (ref 6–20)
CHLORIDE: 100 mmol/L — AB (ref 101–111)
CHLORIDE: 98 mmol/L — AB (ref 101–111)
CO2: 21 mmol/L — ABNORMAL LOW (ref 22–32)
CO2: 21 mmol/L — AB (ref 22–32)
CREATININE: 0.8 mg/dL (ref 0.61–1.24)
Calcium: 8.2 mg/dL — ABNORMAL LOW (ref 8.9–10.3)
Calcium: 8.4 mg/dL — ABNORMAL LOW (ref 8.9–10.3)
Creatinine, Ser: 0.61 mg/dL (ref 0.61–1.24)
GFR calc Af Amer: >60 mL/min (ref >60)
GFR calc Af Amer: >60 mL/min (ref >60)
GFR calc non Af Amer: >60 mL/min (ref >60)
GFR calc non Af Amer: >60 mL/min (ref >60)
GLUCOSE: 80 mg/dL (ref 65–99)
Glucose, Bld: 120 mg/dL — ABNORMAL HIGH (ref 65–99)
POTASSIUM: 4 mmol/L (ref 3.5–5.1)
Potassium: 4 mmol/L (ref 3.5–5.1)
Sodium: 128 mmol/L — ABNORMAL LOW (ref 135–145)
Sodium: 128 mmol/L — ABNORMAL LOW (ref 135–145)

## 2017-04-07 LAB — GLUCOSE, CAPILLARY
GLUCOSE-CAPILLARY: 232 mg/dL — AB (ref 65–99)
Glucose-Capillary: 74 mg/dL (ref 65–99)
Glucose-Capillary: 106 mg/dL — ABNORMAL HIGH (ref 65–99)

## 2017-04-07 LAB — CBC
HEMATOCRIT: 27.4 % — AB (ref 40.0–52.0)
HEMOGLOBIN: 9.5 g/dL — AB (ref 13.0–18.0)
MCH: 31.1 pg (ref 26.0–34.0)
MCHC: 34.7 g/dL (ref 32.0–36.0)
MCV: 89.6 fL (ref 80.0–100.0)
Platelets: 300 10*3/uL (ref 150–440)
RBC: 3.05 MIL/uL — AB (ref 4.40–5.90)
RDW: 14.3 % (ref 11.5–14.5)
WBC: 4.7 10*3/uL (ref 3.8–10.6)

## 2017-04-07 MED ORDER — ADULT MULTIVITAMIN W/MINERALS CH
1.0000 | ORAL_TABLET | Freq: Every day | ORAL | Status: DC
  Administered 2017-04-07: 1 via ORAL
  Filled 2017-04-07: qty 1

## 2017-04-07 MED ORDER — SODIUM CHLORIDE 1 G PO TABS
1.0000 g | ORAL_TABLET | Freq: Two times a day (BID) | ORAL | Status: DC
  Administered 2017-04-07: 1 g via ORAL
  Filled 2017-04-07 (×2): qty 1

## 2017-04-07 MED ORDER — ENSURE ENLIVE PO LIQD: 237.0000 mL | Freq: Two times a day (BID) | ORAL | 12 refills | Status: AC

## 2017-04-07 MED ORDER — ENSURE ENLIVE PO LIQD
237.0000 mL | Freq: Two times a day (BID) | ORAL | Status: DC
  Administered 2017-04-07 (×2): 237 mL via ORAL

## 2017-04-07 MED ORDER — CEPHALEXIN 500 MG PO CAPS: 500.0000 mg | ORAL_CAPSULE | Freq: Three times a day (TID) | ORAL | 0 refills | Status: AC

## 2017-04-07 MED ORDER — SODIUM CHLORIDE 1 G PO TABS: 1.0000 g | ORAL_TABLET | Freq: Two times a day (BID) | ORAL | 0 refills | Status: AC

## 2017-04-07 NOTE — Care Management CC44 (Signed)
Condition Code 44 Documentation Completed  Patient Details  Name: [name] MRN: [id number] Date of Birth: 04/01/1926   Condition Code 44 given:  Yes Patient signature on Condition Code 44 notice:  Yes Documentation of 2 MD's agreement:  Yes Code 44 added to claim:  Yes    Chapman FitchBOWEN, Robinn Overholt T, RN 04/07/2017, 4:31 PM

## 2017-04-07 NOTE — Progress Notes (Addendum)
Initial Nutrition Assessment  DOCUMENTATION CODES:   Severe malnutrition in context of chronic illness  INTERVENTION:   Magic cup TID with meals, each supplement provides 290 kcal and 9 grams of protein  Ensure Enlive po BID, each supplement provides 350 kcal and 20 grams of protein  MVI daily  NUTRITION DIAGNOSIS:   Severe Malnutrition related to suspected poor oral intake, other (see comment)(advanced age) as evidenced by severe fat depletion, severe muscle depletion.  GOAL:   Patient will meet greater than or equal to 90% of their needs  MONITOR:   PO intake, Supplement acceptance, Labs, Weight trends, I & O's, Skin  REASON FOR ASSESSMENT:   Other (Comment)(low BMI)    ASSESSMENT:   84 year old male with past medical history significant for hypertension, hyperlipidemia, diabetes, and prostatitic hypertrophy presents to hospital secondary to weakness and dizziness and noted to have hyponatremia.   Pt is a poor historian at baseline. Pt recently admitted to the hospital for hyponatremia about two weeks ago. Pt was eating 100% of meals on last admit. Pt reports that he is a good eater at baseline. Pt lives alone and prepares his own meals. Per chart, pt is weight stable; however, pt's last 5 weights recorded are exactly the same. RD suspects weights are reported; will request measured weight. RD will add supplements and MVI to help pt meet his estimated protein needs.   Medications reviewed and include: lovenox, insulin, protonix   Labs reviewed: Na 128(L), Cl 100(L), Ca 8.2(L) Hgb 9.5(L), Hct 27.4(L)  Nutrition-Focused physical exam completed. Findings are severe fat and muscle depletions over entire body, and moderate edema BLE.    Diet Order:  Diet regular Room service appropriate? Yes; Fluid consistency: Thin  EDUCATION NEEDS:   No education needs have been identified at this time  Skin:  Reviewed RN Assessment  Last BM:  PTA  Height:   Ht Readings from  Last 1 Encounters:  04/06/17 5\' 9"  (1.753 m)    Weight:   Wt Readings from Last 1 Encounters:  04/06/17 116 lb (52.6 kg)    Ideal Body Weight:  72.7 kg  BMI:  Body mass index is 17.13 kg/m.  Estimated Nutritional Needs:   Kcal:  1400-1700kcal/day   Protein:  75-75g/day   Fluid:  >1.3L/day or per MD  Betsey Holidayasey Charnell Peplinski MS, RD, LDN Pager #[phone]- [phone] After Hours Pager: [phone]

## 2017-04-07 NOTE — Care Management (Signed)
Patient admitted for Acute on chronic hyponatremia.  Patient lives at home alone.  PCP Dareen PianoAnderson.  Patient states at baseline he is independent and still drives.  Patient has Art gallery managerlectric scooter;Wheelchair - [employer] Corporationmanual;Walker - 2 wheels;Walker - 4 wheels in the home.  PT has assessed patient and recommends home health PT.  Patient was able to ambulate 240 feet.  Patient declines home health services. Patient states "the could come out 100 times, but it wont fix anything.  I need a new Knee cap".  Patient notified that should he change his mind his PCP can order home health PT after discharge.  RNCM signing off.

## 2017-04-07 NOTE — Consult Note (Signed)
[facility]  CONSULT NOTE    Date: 04/07/2017                  Patient Name:  [name]  MRN: [id number]  DOB: 1926/03/20  Age / Sex: 84 y.o., male         PCP: Lauro Regulus, MD                 Service Requesting Consult: Hyponatremia                 Reason for Consult: Dr. Nemiah Commander            History of Present Illness: [name] is a 83 y.o. white male with diabetes mellitus type II, hypertension, BPH, hyperlipidemia, GERD, knee surgery and hip surgery, who was admitted to [facility] on 04/06/2017 for Lower urinary tract infectious disease [N39.0] Hyponatremia [E87.1]  Patient is a poor historian. History taken primarily from chart.   Patient was admitted with serum sodium of 121. Baseline seems to be 130. He was treated with IV fluids.  Thought to have urinary tract infection.   Recently had hydrochlorothiazide discontinued however patient is unable to say if he is taking this medication.   Medications: Outpatient medications: No medications prior to admission.    Current medications: Current Facility-Administered Medications  Medication Dose Route Frequency Provider Last Rate Last Dose  . 0.9 %  sodium chloride infusion   Intravenous Continuous Shaune Pollack, MD 75 mL/hr at 04/06/17 1533    . acetaminophen (TYLENOL) tablet 650 mg  650 mg Oral Q6H PRN Shaune Pollack, MD       Or  . acetaminophen (TYLENOL) suppository 650 mg  650 mg Rectal Q6H PRN Shaune Pollack, MD      . albuterol (PROVENTIL) (2.5 MG/3ML) 0.083% nebulizer solution 2.5 mg  2.5 mg Nebulization Q2H PRN Shaune Pollack, MD      . atorvastatin (LIPITOR) tablet 20 mg  20 mg Oral Daily Shaune Pollack, MD   20 mg at 04/07/17 0945  . bisacodyl (DULCOLAX) EC tablet 5 mg  5 mg Oral Daily PRN Shaune Pollack, MD      . cefTRIAXone (ROCEPHIN) 1 g in dextrose 5 % 50 mL IVPB  1 g Intravenous Q24H Shaune Pollack, MD      . enoxaparin (LOVENOX) injection 40 mg  40 mg Subcutaneous Q24H Shaune Pollack, MD    40 mg at 04/06/17 2113  . feeding supplement (ENSURE ENLIVE) (ENSURE ENLIVE) liquid 237 mL  237 mL Oral BID BM Enid Baas, MD   237 mL at 04/07/17 1513  . finasteride (PROSCAR) tablet 5 mg  5 mg Oral Daily Shaune Pollack, MD   5 mg at 04/07/17 0945  . HYDROcodone-acetaminophen (NORCO/VICODIN) 5-325 MG per tablet 1-2 tablet  1-2 tablet Oral Q4H PRN Shaune Pollack, MD      . insulin aspart (novoLOG) injection 0-5 Units  0-5 Units Subcutaneous QHS Shaune Pollack, MD      . insulin aspart (novoLOG) injection 0-9 Units  0-9 Units Subcutaneous TID WC Shaune Pollack, MD   3 Units at 04/07/17 1749  . latanoprost (XALATAN) 0.005 % ophthalmic solution 1 drop  1 drop Both Eyes QHS Shaune Pollack, MD   1 drop at 04/06/17 2114  . lisinopril (PRINIVIL,ZESTRIL) tablet 10 mg  10 mg Oral Daily Shaune Pollack, MD   10 mg at 04/07/17 0945  . multivitamin with minerals tablet 1 tablet  1 tablet Oral Daily Kalisetti,  Donnita Fallsadhika, MD   1 tablet at 04/07/17 1226  . ondansetron (ZOFRAN) tablet 4 mg  4 mg Oral Q6H PRN Shaune Pollackhen, Qing, MD       Or  . ondansetron [facility](ZOFRAN) injection 4 mg  4 mg Intravenous Q6H PRN Shaune Pollackhen, Qing, MD      . pantoprazole (PROTONIX) EC tablet 40 mg  40 mg Oral Daily Shaune Pollackhen, Qing, MD   40 mg at 04/07/17 0945  . senna-docusate (Senokot-S) tablet 1 tablet  1 tablet Oral QHS PRN Shaune Pollackhen, Qing, MD      . sodium chloride tablet 1 g  1 g Oral BID Enid BaasKalisetti, Radhika, MD   1 g at 04/07/17 1515  . tamsulosin (FLOMAX) capsule 0.4 mg  0.4 mg Oral Daily Shaune Pollackhen, Qing, MD   0.4 mg at 04/07/17 0945   Current Outpatient Medications  Medication Sig Dispense Refill  . atorvastatin (LIPITOR) 40 MG tablet Take 20 mg by mouth daily.    . finasteride (PROSCAR) 5 MG tablet Take 1 tablet (5 mg total) by mouth daily. 90 tablet 3  . latanoprost (XALATAN) 0.005 % ophthalmic solution Place 1 drop into both eyes at bedtime.    Marland Kitchen. lisinopril (PRINIVIL,ZESTRIL) 10 MG tablet Take 1 tablet (10 mg total) by mouth daily. 30 tablet 0  . omeprazole (PRILOSEC) 40  MG capsule Take 40 mg by mouth daily.    . tamsulosin (FLOMAX) 0.4 MG CAPS capsule Take 1 capsule (0.4 mg total) by mouth daily. 90 capsule 3  . cephALEXin (KEFLEX) 500 MG capsule Take 1 capsule (500 mg total) by mouth 3 (three) times daily for 5 days. 15 capsule 0  . feeding supplement, ENSURE ENLIVE, (ENSURE ENLIVE) LIQD Take 237 mLs by mouth 2 (two) times daily between meals. 237 mL 12  . sodium chloride 1 g tablet Take 1 tablet (1 g total) by mouth 2 (two) times daily with a meal. 30 tablet 0      Allergies: No Known Allergies    Past Medical History: Past Medical History:  Diagnosis Date  . BPH (benign prostatic hyperplasia)   . Diabetes mellitus without complication (HCC)   . GERD (gastroesophageal reflux disease)   . HLD (hyperlipidemia)   . Hypertension      Past Surgical History: Past Surgical History:  Procedure Laterality Date  . HIP FRACTURE SURGERY    . KNEE SURGERY       Family History: Family History  Problem Relation Age of Onset  . Diabetes Father   . Hypertension Father   . Prostate cancer Father      Social History: Social History   Socioeconomic History  . Marital status: Married    Spouse name: Not on file  . Number of children: Not on file  . Years of education: Not on file  . Highest education level: Not on file  Social Needs  . Financial resource strain: Not on file  . Food insecurity - worry: Not on file  . Food insecurity - inability: Not on file  . Transportation needs - medical: Not on file  . Transportation needs - non-medical: Not on file  Occupational History  . Not on file  Tobacco Use  . Smoking status: Never Smoker  . Smokeless tobacco: Never Used  Substance and Sexual Activity  . Alcohol use: No  . Drug use: No  . Sexual activity: No  Other Topics Concern  . Not on file  Social History Narrative  . Not on file     Review  of Systems: Review of Systems  Unable to perform ROS: Age    Vital Signs: Blood  pressure 110/66, pulse (!) 56, temperature (!) 97.4 F (36.3 C), temperature source Oral, resp. rate 18, height 5\' 9"  (1.753 m), weight 52.6 kg (116 lb), SpO2 100 %.  Weight trends: Filed Weights   04/06/17 1115  Weight: 52.6 kg (116 lb)    Physical Exam: General: NAD, frail elderly man laying in bed  Head: Normocephalic, atraumatic. Moist oral mucosal membranes  Eyes: Anicteric, PERRL  Neck: Supple, trachea midline  Lungs:  Clear to auscultation  Heart: Regular rate and rhythm  Abdomen:  Soft, nontender,   Extremities: no peripheral edema.  Neurologic: Nonfocal, moving all four extremities  Skin: No lesions        Lab results: Basic Metabolic Panel: Recent Labs  Lab 04/06/17 1208 04/07/17 0358 04/07/17 1043  NA 121* 128* 128*  K 3.3* 4.0 4.0  CL 92* 100* 98*  CO2 20* 21* 21*  GLUCOSE 145* 80 120*  BUN 12 9 8   CREATININE 0.71 0.61 0.80  CALCIUM 8.2* 8.2* 8.4*    Liver Function Tests: No results for input(s): AST, ALT, ALKPHOS, BILITOT, PROT, ALBUMIN in the last 168 hours. No results for input(s): LIPASE, AMYLASE in the last 168 hours. No results for input(s): AMMONIA in the last 168 hours.  CBC: Recent Labs  Lab 04/06/17 1208 04/07/17 0358  WBC 4.4 4.7  HGB 9.8* 9.5*  HCT 27.7* 27.4*  MCV 90.1 89.6  PLT 296 300    Cardiac Enzymes: Recent Labs  Lab 04/06/17 1208  TROPONINI <0.03    BNP: Invalid input(s): POCBNP  CBG: Recent Labs  Lab 04/06/17 1655 04/06/17 2124 04/07/17 0742 04/07/17 1157 04/07/17 1652  GLUCAP 136* 79 74 106* 232*    Microbiology: Results for orders placed or performed during the hospital encounter of 04/06/17  Urine Culture     Status: None (Preliminary result)   Collection Time: 04/06/17 12:25 PM  Result Value Ref Range Status   Specimen Description   Final    URINE, RANDOM Performed at [facility], [address] [place], [place] [address]    Special Requests   Final    NONE Performed at [facility], [address], [place], [place] [address]    Culture   Final    CULTURE REINCUBATED FOR BETTER GROWTH Performed at [facility], [address] [place], [place] [address]    Report Status PENDING  Incomplete    Coagulation Studies: No results for input(s): LABPROT, INR in the last 72 hours.  Urinalysis: Recent Labs    04/06/17 1225  COLORURINE YELLOW*  LABSPEC 1.003*  PHURINE 6.0  GLUCOSEU NEGATIVE  HGBUR LARGE*  BILIRUBINUR NEGATIVE  KETONESUR NEGATIVE  PROTEINUR 30*  NITRITE NEGATIVE  LEUKOCYTESUR LARGE*      Imaging:  No results found.   Assessment & Plan: [name] is a 84 y.o. white male with diabetes mellitus type II, hypertension, BPH, hyperlipidemia, GERD, knee surgery and hip surgery, who was admitted to [facility] on 04/06/2017 for Lower urinary tract infectious disease [N39.0] Hyponatremia [E87.1]  1. Hyponatremia: hypovolemic. Poor PO intake versus thiazide diuretic. "Tea and Toast" diet.  - Improved with IV fluids - Encourage PO intake.   2. Hypertension: blood pressure at goal during admission. - hold lisinopril.   3. Urinary tract infection:  - empiric cephalosporin.       LOS: 1 Corrion Stirewalt 2/1/20198:56 PM

## 2017-04-07 NOTE — Progress Notes (Signed)
[facility] [place] [facility]   PATIENT NAME: Christian RiserClarence [name]    MR#:  [id number]  DATE OF BIRTH:  05/12/1926  SUBJECTIVE:  CHIEF COMPLAINT:   Chief Complaint  Patient presents with  . Dizziness  . Fatigue  . Diarrhea   - came in with weakness, dizziness -  Sodium was low again. Improving with IV fluids. Patient states he has been drinking plenty of IV fluids since last discharge  REVIEW OF SYSTEMS:  Review of Systems  Constitutional: Positive for malaise/fatigue. Negative for chills and fever.  HENT: Negative for congestion, ear discharge, hearing loss and nosebleeds.   Eyes: Negative for blurred vision and double vision.  Respiratory: Negative for cough, shortness of breath and wheezing.   Cardiovascular: Negative for chest pain, palpitations and leg swelling.  Gastrointestinal: Negative for abdominal pain, constipation, diarrhea, nausea and vomiting.  Genitourinary: Negative for dysuria.  Musculoskeletal: Positive for myalgias.  Neurological: Negative for dizziness, speech change, focal weakness, seizures and headaches.    DRUG ALLERGIES:  No Known Allergies  VITALS:  Blood pressure (!) 118/59, pulse 70, temperature (!) 97.5 F (36.4 C), temperature source Oral, resp. rate 20, height 5\' 9"  (1.753 m), weight 52.6 kg (116 lb), SpO2 100 %.  PHYSICAL EXAMINATION:  Physical Exam  GENERAL:  84 y.o.-year-old eldely patient lying in the bed with no acute distress.  EYES: Pupils equal, round, reactive to light and accommodation. No scleral icterus. Extraocular muscles intact.  HEENT: Head atraumatic, normocephalic. Oropharynx and nasopharynx clear.  NECK:  Supple, no jugular venous distention. No thyroid enlargement, no tenderness.  LUNGS: Normal breath sounds bilaterally, no wheezing, rales,rhonchi or crepitation. No use of accessory muscles of respiration. Decreased bibasilar breath sounds CARDIOVASCULAR: S1, S2 normal. No  rubs, or gallops. 2/6  systolic murmur present ABDOMEN: Soft, nontender, nondistended. Bowel sounds present. No organomegaly or mass.  EXTREMITIES: No pedal edema, cyanosis, or clubbing.  NEUROLOGIC: Cranial nerves II through XII are intact. Muscle strength 5/5 in all extremities. Sensation intact. Gait not checked. Global weakness present. PSYCHIATRIC: The patient is alert and oriented x 3. Stiff speech, slow SKIN: No obvious rash, lesion, or ulcer.    LABORATORY PANEL:   CBC Recent Labs  Lab 04/07/17 0358  WBC 4.7  HGB 9.5*  HCT 27.4*  PLT 300   ------------------------------------------------------------------------------------------------------------------  Chemistries  Recent Labs  Lab 04/07/17 0358  NA 128*  K 4.0  CL 100*  CO2 21*  GLUCOSE 80  BUN 9  CREATININE 0.61  CALCIUM 8.2*   ------------------------------------------------------------------------------------------------------------------  Cardiac Enzymes Recent Labs  Lab 04/06/17 1208  TROPONINI <0.03   ------------------------------------------------------------------------------------------------------------------  RADIOLOGY:  No results found.  EKG:   Orders placed or performed during the hospital encounter of 04/06/17  . EKG 12-Lead  . EKG 12-Lead  . ED EKG  . ED EKG    ASSESSMENT AND PLAN:   84 year old male with past medical history significant for hypertension, hyperlipidemia, diabetes, and I'm prostatitic hypertrophy presents to hospital secondary to weakness and dizziness and noted to have hyponatremia.  1. Acute on chronic hyponatremia-second admission for this same cause in the last month. -Last time his hydrochlorothiazide was discontinued. He has been drinking plenty of fluids but has had diarrhea for 5 days before admission. -Sodium was low at 121, received IV fluids and improved up to 128. -Nephrology consult is pending. Not sure if he would be a candidate for salt tablets at discharge. Also  advised to eat solid food along with  just drinking plenty fluids  2. Acute cystitis-could be colonized patient. Has a Foley catheter for the last month due to BPH. -On Rocephin, discharged on Keflex.  3. BPH with urinary retention-had a Foley catheter for a month, after removing, had urinary retention and so Foley placed back in as outpatient. -Follow up with urology as per schedule.  4. Hypertension-on lisinopril  5. Hyperlipidemia-statin  6. DVT Prophylaxis- lovenox  Physical therapy consulted. Patient has a walker with a seat at  baseline.    All the records are reviewed and case discussed with Care Management/Social Workerr. Management plans discussed with the patient, family and they are in agreement.  CODE STATUS: DNR  TOTAL TIME TAKING CARE OF THIS PATIENT: 38 minutes.   POSSIBLE D/C TODAY OR TOMORROW, DEPENDING ON CLINICAL CONDITION.   Enid Baas M.D on 04/07/2017 at 10:05 AM  Between 7am to 6pm - Pager - [phone]  After 6pm go to [email or web address] - Social research officer, government  [facility]  Office  [phone]  CC: Primary care physician; Lauro Regulus, MD

## 2017-04-07 NOTE — Progress Notes (Signed)
Pharmacy Antibiotic Note  Cindie LarocheClarence J Plotner is a 84 y.o. male admitted on 04/06/2017 with UTI.  Pharmacy has been consulted for ceftriaxone dosing.  Plan: CTX 1 g iv q 24 hours.   Height: 5\' 9"  (175.3 cm) Weight: 116 lb (52.6 kg) IBW/kg (Calculated) : 70.7  Temp (24hrs), Avg:97.3 F (36.3 C), Min:95.9 F (35.5 C), Max:98.4 F (36.9 C)  Recent Labs  Lab 04/06/17 1208 04/07/17 0358 04/07/17 1043  WBC 4.4 4.7  --   CREATININE 0.71 0.61 0.80    Estimated Creatinine Clearance: 47.5 mL/min (by C-G formula based on SCr of 0.8 mg/dL).    No Known Allergies  Antimicrobials this admission: CTX 1/31 >>   Dose adjustments this admission:   Microbiology results: 1/31 UCx: sent    Thank you for allowing pharmacy to be a part of this patient's care.  Kristene Liberati D 04/07/2017 1:30 PM

## 2017-04-07 NOTE — Evaluation (Signed)
Physical Therapy Evaluation Patient Details Name: [name] MRN: [id number] DOB: Jan 16, 1927 Today's Date: 04/07/2017   History of Present Illness  Pt is an 84 y.o. male admitted to hospital 04/06/17 with weakness, dizziness, and diarrhea.  Pt admitted with severe hyponatremia, hypokalemia, and UTI.  PMH includes chronic foley catheter d/t BPH, DM, htn, UTI, h/o hip fx surgery, knee surgery.  Pt reports h/o "bad L knee" (not a surgical candidate per pt).  Clinical Impression  Prior to hospital admission, pt was modified independent with ambulation using 4ww; pt also reports recent therapy within the home.  Pt lives alone in 1 level home with 1 step to enter home; has some family support.  Currently pt is SBA supine to sit and CGA with transfers.  Pt initially refusing to wear his "special shoes" and demonstrating unsteady gait requiring assist for balance and safety ambulating 40 feet with RW (significant extra time required for ambulation as well).  Eventually pt agreed to putting on his "special shoes" with lift in R shoe: pt's ambulation then demonstrating significant improved balance and gait (although still demonstrating impaired gait mechanics); no loss of balance noted with ambulation with shoes donned (x120 feet with RW).  Pt would benefit from skilled PT to address noted impairments and functional limitations (see below for any additional details).  Upon hospital discharge, recommend pt discharge to home with HHPT.    Follow Up Recommendations Home health PT    Equipment Recommendations  Rolling walker with 5" wheels    Recommendations for Other Services       Precautions / Restrictions Precautions Precautions: Fall Precaution Comments: Wears orthotic shoes. Restrictions Weight Bearing Restrictions: No      Mobility  Bed Mobility Overal bed mobility: Needs Assistance Bed Mobility: Supine to Sit     Supine to sit: Supervision;HOB elevated     General bed mobility  comments: mild increased effort to perform on own but no physical assist required  Transfers Overall transfer level: Needs assistance Equipment used: Rolling walker (2 wheeled) Transfers: Sit to/from Stand Sit to Stand: Min guard         General transfer comment: x3 trials with RW from bed (pt with increased effort and multiple attempts to stand on own but no physical assist required; stood better with B shoes donned)  Ambulation/Gait Ambulation/Gait assistance: Min guard;Min assist Ambulation Distance (Feet): (40 feet; 120 feet) Assistive device: Rolling walker (2 wheeled)   Gait velocity: decreased   General Gait Details: after taking a few steps with RW, pt stating that he normally wore special shoes to walk with and he walked better with them but pt refused to return to bed and put on shoes (pt insisted on walking to door and back); pt with significant L knee flexion during L stance phase and unsteady requiring consistent CGA to min assist to steady for safety (increased effort and time to perform ambulation required); after returning to bed pt's IV site noted to be bleeding and nursing came to address IV; B shoes donned and significant improvement noted with ambulation (R shoe has lift); pt still with L knee flexion during L stance phase (although decreased compared to no shoes) but improved R LE step through gait pattern intermittently and pt steady  Careers information officer    Modified Rankin (Stroke Patients Only)       Balance Overall balance assessment: Needs assistance Sitting-balance support: No upper extremity supported;Feet supported  Sitting balance-Leahy Scale: Normal Sitting balance - Comments: steady sitting reaching within BOS   Standing balance support: Single extremity supported Standing balance-Leahy Scale: Poor Standing balance comment: requires at least single UE support for static standing balance                              Pertinent Vitals/Pain Pain Assessment: No/denies pain  Vitals (HR and O2 on room air) stable and WFL throughout treatment session.    Home Living Family/patient expects to be discharged to:: Private residence Living Arrangements: Alone Available Help at Discharge: Family Type of Home: House Home Access: Stairs to enter   [employer] of Steps: 1 Home Layout: One level Home Equipment: Art gallery manager;Wheelchair - [employer] - 2 wheels;Walker - 4 wheels      Prior Function Level of Independence: Needs assistance   Gait / Transfers Assistance Needed: Ambulates with orthotic shoes and 4ww.  Occasional use of electric scooter.  ADL's / Homemaking Assistance Needed: Has someone that comes to assist with bathing. (+) drives.  Has family assist for meals or will drive himself somewhere to get food.  Pt reports his granddaughter assists him a lot.   Comments: Pt reports recent physical therapy within the home.     Hand Dominance        Extremity/Trunk Assessment   Upper Extremity Assessment Upper Extremity Assessment: Generalized weakness    Lower Extremity Assessment Lower Extremity Assessment: Generalized weakness    Cervical / Trunk Assessment Cervical / Trunk Assessment: Normal  Communication   Communication: No difficulties  Cognition Arousal/Alertness: Awake/alert Behavior During Therapy: Impulsive Overall Cognitive Status: Within Functional Limits for tasks assessed                                        General Comments General comments (skin integrity, edema, etc.): L knee swelling noted with bogginess medial > lateral L knee (pt reports this is baseline and actually was "good" today).  Nursing cleared pt for participation in physical therapy.  Pt agreeable to PT session.    Exercises     Assessment/Plan    PT Assessment Patient needs continued PT services  PT Problem List Decreased strength;Decreased balance;Decreased  mobility       PT Treatment Interventions DME instruction;Gait training;Stair training;Functional mobility training;Therapeutic activities;Therapeutic exercise;Balance training;Patient/family education    PT Goals (Current goals can be found in the Care Plan section)  Acute Rehab PT Goals Patient Stated Goal: to go home PT Goal Formulation: With patient Time For Goal Achievement: 04/21/17 Potential to Achieve Goals: Good    Frequency Min 2X/week   Barriers to discharge        Co-evaluation               AM-PAC PT "6 Clicks" Daily Activity  Outcome Measure Difficulty turning over in bed (including adjusting bedclothes, sheets and blankets)?: None Difficulty moving from lying on back to sitting on the side of the bed? : A Little Difficulty sitting down on and standing up from a chair with arms (e.g., wheelchair, bedside commode, etc,.)?: A Lot Help needed moving to and from a bed to chair (including a wheelchair)?: A Little Help needed walking in hospital room?: A Little Help needed climbing 3-5 steps with a railing? : A Little 6 Click Score: 18    End of Session Equipment Utilized  During Treatment: Gait belt Activity Tolerance: Patient tolerated treatment well Patient left: in chair;with call bell/phone within reach;with chair alarm set;with nursing/sitter in room Nurse Communication: Mobility status;Precautions PT Visit Diagnosis: Other abnormalities of gait and mobility (R26.89);Muscle weakness (generalized) (M62.81);History of falling (Z91.81);Difficulty in walking, not elsewhere classified (R26.2)    Time: 1355-1455 PT Time Calculation (min) (ACUTE ONLY): 60 min   Charges:   PT Evaluation $PT Eval Low Complexity: 1 Low PT Treatments $Gait Training: 8-22 mins $Therapeutic Activity: 8-22 mins   PT G CodesHendricks Limes:        Jaria Conway, PT 04/07/17, 4:51 PM [phone]

## 2017-04-07 NOTE — Discharge Summary (Signed)
[employer] - [place] at [facility]   PATIENT NAME: [name]    MR#:  [id number]  DATE OF BIRTH:  1926-03-15  DATE OF ADMISSION:  04/06/2017   ADMITTING PHYSICIAN: Shaune Pollack, MD  DATE OF DISCHARGE: 04/07/17  PRIMARY CARE PHYSICIAN: Lauro Regulus, MD   ADMISSION DIAGNOSIS:   Lower urinary tract infectious disease [N39.0] Hyponatremia [E87.1]  DISCHARGE DIAGNOSIS:   Active Problems:   Hyponatremia   SECONDARY DIAGNOSIS:   Past Medical History:  Diagnosis Date  . BPH (benign prostatic hyperplasia)   . Diabetes mellitus without complication (HCC)   . GERD (gastroesophageal reflux disease)   . HLD (hyperlipidemia)   . Hypertension     HOSPITAL COURSE:   84 year old male with past medical history significant for hypertension, hyperlipidemia, diabetes, and I'm prostatitic hypertrophy presents to hospital secondary to weakness and dizziness and noted to have hyponatremia.  1. Acute on chronic hyponatremia-second admission for this same cause in the last month. -Last time his hydrochlorothiazide was discontinued. This time he has been drinking plenty of fluids but has had diarrhea for 5 days before admission. -Sodium was low at 121, received IV fluids and improved up to 128. -Nephrology consult is appreciated.  - advised to eat solid food along with just drinking plenty fluids - started salt tablets for 2 weeks and nephrology f/u as outpatient, repeat sodium in 1 week  2. Acute cystitis-could be colonized bacteria.  -Has a Foley catheter for the last month due to BPH. -On Rocephin, discharged on Keflex.  3. BPH with urinary retention-had a Foley catheter for a month, after removing, had urinary retention and so Foley placed back in as outpatient. -Follow up with urology as per schedule. - on finasteride and flomax  4. Hypertension-on lisinopril  5. Hyperlipidemia-statin   Physical therapy consulted. Patient has a walker with a  seat at  baseline. Can resume home health services    DISCHARGE CONDITIONS:   Guarded  CONSULTS OBTAINED:   None  DRUG ALLERGIES:   No Known Allergies DISCHARGE MEDICATIONS:   Allergies as of 04/07/2017   No Known Allergies     Medication List    STOP taking these medications   furosemide 20 MG tablet Commonly known as:  LASIX   potassium chloride 10 MEQ tablet Commonly known as:  K-DUR     TAKE these medications   atorvastatin 40 MG tablet Commonly known as:  LIPITOR Take 20 mg by mouth daily.   cephALEXin 500 MG capsule Commonly known as:  KEFLEX Take 1 capsule (500 mg total) by mouth 3 (three) times daily for 5 days.   feeding supplement (ENSURE ENLIVE) Liqd Take 237 mLs by mouth 2 (two) times daily between meals.   finasteride 5 MG tablet Commonly known as:  PROSCAR Take 1 tablet (5 mg total) by mouth daily.   latanoprost 0.005 % ophthalmic solution Commonly known as:  XALATAN Place 1 drop into both eyes at bedtime.   lisinopril 10 MG tablet Commonly known as:  PRINIVIL,ZESTRIL Take 1 tablet (10 mg total) by mouth daily.   omeprazole 40 MG capsule Commonly known as:  PRILOSEC Take 40 mg by mouth daily.   sodium chloride 1 g tablet Take 1 tablet (1 g total) by mouth 2 (two) times daily with a meal.   tamsulosin 0.4 MG Caps capsule Commonly known as:  FLOMAX Take 1 capsule (0.4 mg total) by mouth daily.        DISCHARGE INSTRUCTIONS:  1. PCP f/u in 1 week 2. Nephrology f/u in 2 weeks  DIET:   Cardiac diet  ACTIVITY:   Activity as tolerated  OXYGEN:   Home Oxygen: No.  Oxygen Delivery: room air  DISCHARGE LOCATION:   home   If you experience worsening of your admission symptoms, develop shortness of breath, life threatening emergency, suicidal or homicidal thoughts you must seek medical attention immediately by calling 911 or calling your MD immediately  if symptoms less severe.  You Must read complete  instructions/literature along with all the possible adverse reactions/side effects for all the Medicines you take and that have been prescribed to you. Take any new Medicines after you have completely understood and accpet all the possible adverse reactions/side effects.   Please note  You were cared for by a hospitalist during your hospital stay. If you have any questions about your discharge medications or the care you received while you were in the hospital after you are discharged, you can call the unit and asked to speak with the hospitalist on call if the hospitalist that took care of you is not available. Once you are discharged, your primary care physician will handle any further medical issues. Please note that NO REFILLS for any discharge medications will be authorized once you are discharged, as it is imperative that you return to your primary care physician (or establish a relationship with a primary care physician if you do not have one) for your aftercare needs so that they can reassess your need for medications and monitor your lab values.    On the day of Discharge:  VITAL SIGNS:   Blood pressure 110/66, pulse (!) 56, temperature (!) 97.4 F (36.3 C), temperature source Oral, resp. rate 18, height 5\' 9"  (1.753 m), weight 52.6 kg (116 lb), SpO2 100 %.  PHYSICAL EXAMINATION:    GENERAL:  84 y.o.-year-old eldely patient lying in the bed with no acute distress.  EYES: Pupils equal, round, reactive to light and accommodation. No scleral icterus. Extraocular muscles intact.  HEENT: Head atraumatic, normocephalic. Oropharynx and nasopharynx clear.  NECK:  Supple, no jugular venous distention. No thyroid enlargement, no tenderness.  LUNGS: Normal breath sounds bilaterally, no wheezing, rales,rhonchi or crepitation. No use of accessory muscles of respiration. Decreased bibasilar breath sounds CARDIOVASCULAR: S1, S2 normal. No  rubs, or gallops. 2/6 systolic murmur present ABDOMEN: Soft,  nontender, nondistended. Bowel sounds present. No organomegaly or mass.  EXTREMITIES: No pedal edema, cyanosis, or clubbing.  NEUROLOGIC: Cranial nerves II through XII are intact. Muscle strength 5/5 in all extremities. Sensation intact. Gait not checked. Global weakness present. PSYCHIATRIC: The patient is alert and oriented x 3. Stiff speech, slow SKIN: No obvious rash, lesion, or ulcer.     DATA REVIEW:   CBC Recent Labs  Lab 04/07/17 0358  WBC 4.7  HGB 9.5*  HCT 27.4*  PLT 300    Chemistries  Recent Labs  Lab 04/07/17 1043  NA 128*  K 4.0  CL 98*  CO2 21*  GLUCOSE 120*  BUN 8  CREATININE 0.80  CALCIUM 8.4*     Microbiology Results  Results for orders placed or performed during the hospital encounter of 04/06/17  Urine Culture     Status: None (Preliminary result)   Collection Time: 04/06/17 12:25 PM  Result Value Ref Range Status   Specimen Description   Final    URINE, RANDOM Performed at [facility], [address] [place], [place] [address]    Special  Requests   Final    NONE Performed at [facility], [address] [place], [place] [address]    Culture   Final    CULTURE REINCUBATED FOR BETTER GROWTH Performed at [facility], [address], [place], [place] [address]    Report Status PENDING  Incomplete    RADIOLOGY:  No results found.   Management plans discussed with the patient, family and they are in agreement.  CODE STATUS:     Code Status Orders  (From admission, onward)        Start     Ordered   04/06/17 1521  Do not attempt resuscitation (DNR)  Continuous    Question Answer Comment  In the event of cardiac or respiratory ARREST Do not call a "code blue"   In the event of cardiac or respiratory ARREST Do not perform Intubation, CPR, defibrillation or ACLS   In the event of cardiac or respiratory ARREST Use medication by any route, position, wound care, and other measures to relive pain and  suffering. May use oxygen, suction and manual treatment of airway obstruction as needed for comfort.      04/06/17 1520    Code Status History    Date Active Date Inactive Code Status Order ID Comments User Context   03/17/2017 22:50 03/20/2017 21:12 Full Code [id number]  Oralia Manis, MD Inpatient    Advance Directive Documentation     Most Recent Value  Type of Advance Directive  Healthcare Power of Attorney  Pre-existing out of facility DNR order (yellow form or pink MOST form)  No data  "MOST" Form in Place?  No data      TOTAL TIME TAKING CARE OF THIS PATIENT: 38 minutes.    Malisha Mabey M.D on 04/07/2017 at 3:46 PM  Between 7am to 6pm - Pager - [phone]  After 6pm go to [email or web address] - Scientist, research (life sciences)  Hospitalists  Office  [phone]  CC: Primary care physician; Lauro Regulus, MD   Note: This dictation was prepared with Dragon dictation along with smaller phrase technology. Any transcriptional errors that result from this process are unintentional.

## 2017-04-07 NOTE — Care Management Obs Status (Signed)
MEDICARE OBSERVATION STATUS NOTIFICATION   Patient Details  Name: Christian LarocheClarence J [name] MRN: [id number] Date of Birth: 05/02/1926   Medicare Observation Status Notification Given:  Yes    Chapman FitchBOWEN, Brigham [name] T, RN 04/07/2017, 4:31 PM

## 2017-04-07 NOTE — Care Management Important Message (Signed)
Important Message  Patient Details  Name: Cindie LarocheClarence J Ballinger MRN: [id number] Date of Birth: 02/20/1927   Medicare Important Message Given:  N/A - LOS <3 / Initial given by admissions    Chapman FitchBOWEN, Keionte Swicegood T, RN 04/07/2017, 4:01 PM

## 2017-04-07 NOTE — Progress Notes (Signed)
Discharge teaching given to patient, patient verbalized understanding and had no questions. Patient IV removed. Patient will be transported home by family. All patient belongings gathered prior to leaving. Patient will  Be taken to his home by his granddaughter.

## 2017-04-09 LAB — URINE CULTURE

## 2017-04-11 ENCOUNTER — Ambulatory Visit: Payer: Medicare Other | Admitting: Urology

## 2017-04-11 NOTE — Progress Notes (Deleted)
04/11/2017 8:22 AM   [name] 03/11/1926 [id number]  Referring provider: Lauro Regulus, MD [address] [facility] [place] [place], [place] [address]  No chief complaint on file.   HPI: 84 year old male who returns to the office today for Foley catheter exchange.  He has had several failed trial of voids with large volume most recently, he was hospitalized on 03/17/2017 for failed trial of void, UTI, and hyponatremia.  During this admission, Dr. Ronne Binning was consulted and options for bladder management were reviewed.  He elected to continue with chronic indwelling Foley catheter.  He does have severe baseline dementia and is a poor historian.   PMH: Past Medical History:  Diagnosis Date  . BPH (benign prostatic hyperplasia)   . Diabetes mellitus without complication (HCC)   . GERD (gastroesophageal reflux disease)   . HLD (hyperlipidemia)   . Hypertension     Surgical History: Past Surgical History:  Procedure Laterality Date  . HIP FRACTURE SURGERY    . KNEE SURGERY      Home Medications:  Allergies as of 04/11/2017   No Known Allergies     Medication List        Accurate as of 04/11/17  8:22 AM. Always use your most recent med list.          atorvastatin 40 MG tablet Commonly known as:  LIPITOR Take 20 mg by mouth daily.   cephALEXin 500 MG capsule Commonly known as:  KEFLEX Take 1 capsule (500 mg total) by mouth 3 (three) times daily for 5 days.   feeding supplement (ENSURE ENLIVE) Liqd Take 237 mLs by mouth 2 (two) times daily between meals.   finasteride 5 MG tablet Commonly known as:  PROSCAR Take 1 tablet (5 mg total) by mouth daily.   latanoprost 0.005 % ophthalmic solution Commonly known as:  XALATAN Place 1 drop into both eyes at bedtime.   lisinopril 10 MG tablet Commonly known as:  PRINIVIL,ZESTRIL Take 1 tablet (10 mg total) by mouth daily.   omeprazole 40 MG capsule Commonly known as:  PRILOSEC Take 40  mg by mouth daily.   sodium chloride 1 g tablet Take 1 tablet (1 g total) by mouth 2 (two) times daily with a meal.   tamsulosin 0.4 MG Caps capsule Commonly known as:  FLOMAX Take 1 capsule (0.4 mg total) by mouth daily.       Allergies: No Known Allergies  Family History: Family History  Problem Relation Age of Onset  . Diabetes Father   . Hypertension Father   . Prostate cancer Father     Social History:  reports that  has never smoked. he has never used smokeless tobacco. He reports that he does not drink alcohol or use drugs.  ROS:                                        Physical Exam: There were no vitals taken for this visit.  Constitutional:  Alert and oriented, No acute distress. HEENT: [place] AT, moist mucus membranes.  Trachea midline, no masses. Cardiovascular: No clubbing, cyanosis, or edema. Respiratory: Normal respiratory effort, no increased work of breathing. GI: Abdomen is soft, nontender, nondistended, no abdominal masses GU: No CVA tenderness. *** Skin: No rashes, bruises or suspicious lesions. Lymph: No cervical or inguinal adenopathy. Neurologic: Grossly intact, no focal deficits, moving all 4 extremities.  Psychiatric: Normal mood and affect.  Laboratory Data: Lab Results  Component Value Date   WBC 4.7 04/07/2017   HGB 9.5 (L) 04/07/2017   HCT 27.4 (L) 04/07/2017   MCV 89.6 04/07/2017   PLT 300 04/07/2017    Lab Results  Component Value Date   CREATININE 0.80 04/07/2017    No results found for: PSA1  No results found for: TESTOSTERONE  No results found for: HGBA1C  Urinalysis Lab Results  Component Value Date   SPECGRAV 1.020 02/15/2017   PHUR 5.5 02/15/2017   COLORU Yellow 02/15/2017   APPEARANCEUR HAZY (A) 04/06/2017   LEUKOCYTESUR LARGE (A) 04/06/2017   PROTEINUR 30 (A) 04/06/2017   GLUCOSEU NEGATIVE 04/06/2017   KETONESU Negative 02/15/2017   RBCU TOO NUMEROUS TO COUNT 04/06/2017   BILIRUBINUR  NEGATIVE 04/06/2017   UUROB 0.2 02/15/2017   NITRITE NEGATIVE 04/06/2017    Lab Results  Component Value Date   LABMICR See below: 02/15/2017   WBCUA None seen 02/15/2017   RBCUA 0-2 02/15/2017   LABEPIT None seen 02/15/2017   MUCUS Present (A) 02/15/2017   BACTERIA NONE SEEN 04/06/2017    Pertinent Imaging: *** No results found for this or any previous visit. No results found for this or any previous visit. No results found for this or any previous visit. No results found for this or any previous visit. No results found for this or any previous visit. No results found for this or any previous visit. No results found for this or any previous visit. No results found for this or any previous visit.  Assessment & Plan:  ***  There are no diagnoses linked to this encounter.  No Follow-up on file.  Vanna ScotlandAshley Kevina Piloto, MD  [facility] [address], [address] [place], [place] [address] [phone]

## 2017-04-13 ENCOUNTER — Ambulatory Visit: Payer: Medicare Other | Admitting: Podiatry

## 2017-04-18 NOTE — Progress Notes (Signed)
04/19/2017 10:07 AM   [name] 10/10/1926 [id number]  Referring provider: [name], [name], MD [address] [facility] [place], [place] [address]  Chief Complaint  Patient presents with  . Follow-up    HPI: 84  Yo WM with urinary retention who present today for a one month follow up with his son-in-law.    Background history Patient is an 84 year old Caucasian male who is referred by Dr. Marya AmslerMarshall W. Anderson for night time incontinence who presents with his granddaughter, [name].  Patient has dementia and is a poor historian.  His granddaughter states recently he has noted frequency, urgency, dysuria, nocturia, hesitancy blood in the urine and weak urinary stream.  He has not had suprapubic pain or flank pain.  He has not had any fevers, chills, nausea or vomiting.  His PVR is > 999 mL.  His UA was unremarkable.  Indwelling Foley placed without difficulty.  1400 cc of urine obtained.   Tamsulosin and finasteride were started.  He was then scheduled for a TOV.    Foley was removed on 02/22/2017 for a TOV.  He failed and a Foley was replaced with a 500 cc PVR.    At his visit on 03/16/2017, he states he wanted to have a TOV again.  I did explain my reservations regarding his chance of success.  He has been taking his finasteride and tamsulosin daily.  He has not had fevers, chills, nausea or vomiting.  He has not had gross hematuria or suprapubic pain.   He failed TOV again and was also hospitalized for hyponatremia.    Today, he is here for a Foley catheter exchange.    PMH: Past Medical History:  Diagnosis Date  . BPH (benign prostatic hyperplasia)   . Diabetes mellitus without complication (HCC)   . GERD (gastroesophageal reflux disease)   . HLD (hyperlipidemia)   . Hypertension     Surgical History: Past Surgical History:  Procedure Laterality Date  . HIP FRACTURE SURGERY    . KNEE SURGERY      Home Medications:  Allergies as of  04/19/2017   No Known Allergies     Medication List        Accurate as of 04/19/17 10:07 AM. Always use your most recent med list.          atorvastatin 40 MG tablet Commonly known as:  LIPITOR Take 20 mg by mouth daily.   feeding supplement (ENSURE ENLIVE) Liqd Take 237 mLs by mouth 2 (two) times daily between meals.   finasteride 5 MG tablet Commonly known as:  PROSCAR Take 1 tablet (5 mg total) by mouth daily.   latanoprost 0.005 % ophthalmic solution Commonly known as:  XALATAN Place 1 drop into both eyes at bedtime.   lisinopril 10 MG tablet Commonly known as:  PRINIVIL,ZESTRIL Take 1 tablet (10 mg total) by mouth daily.   omeprazole 40 MG capsule Commonly known as:  PRILOSEC Take 40 mg by mouth daily.   sodium chloride 1 g tablet Take 1 tablet (1 g total) by mouth 2 (two) times daily with a meal.   tamsulosin 0.4 MG Caps capsule Commonly known as:  FLOMAX Take 1 capsule (0.4 mg total) by mouth daily.       Allergies: No Known Allergies  Family History: Family History  Problem Relation Age of Onset  . Diabetes Father   . Hypertension Father   . Prostate cancer Father  Social History:  reports that  has never smoked. he has never used smokeless tobacco. He reports that he does not drink alcohol or use drugs.  ROS: UROLOGY Frequent Urination?: No Hard to postpone urination?: No Burning/pain with urination?: No Get up at night to urinate?: No Leakage of urine?: No Urine stream starts and stops?: No Trouble starting stream?: No Do you have to strain to urinate?: No Blood in urine?: No Urinary tract infection?: Yes Sexually transmitted disease?: No Injury to kidneys or bladder?: No Painful intercourse?: No Weak stream?: No Erection problems?: No Penile pain?: No  Gastrointestinal Nausea?: No Vomiting?: No Indigestion/heartburn?: No Diarrhea?: Yes Constipation?: No  Constitutional Fever: No Night sweats?: No Weight loss?:  Yes Fatigue?: No  Skin Skin rash/lesions?: No Itching?: No  Eyes Blurred vision?: No Double vision?: No  Ears/Nose/Throat Sore throat?: No Sinus problems?: No  Hematologic/Lymphatic Swollen glands?: No Easy bruising?: No  Cardiovascular Leg swelling?: Yes Chest pain?: No  Respiratory Cough?: No Shortness of breath?: No  Endocrine Excessive thirst?: No  Musculoskeletal Back pain?: No Joint pain?: Yes  Neurological Headaches?: No Dizziness?: No  Psychologic Depression?: No Anxiety?: No  Physical Exam: BP 117/69   Pulse 80   Ht 5\' 9"  (1.753 m)   Wt 125 lb (56.7 kg)   BMI 18.46 kg/m   Constitutional: Well nourished. Alert and oriented, No acute distress. HEENT: [place] AT, moist mucus membranes. Trachea midline, no masses. Cardiovascular: No clubbing, cyanosis, or edema. Respiratory: Normal respiratory effort, no increased work of breathing. Skin: No rashes, bruises or suspicious lesions. Lymph: No cervical or inguinal adenopathy. Neurologic: Grossly intact, no focal deficits, moving all 4 extremities. Psychiatric: Normal mood and affect.  Laboratory Data: Lab Results  Component Value Date   WBC 4.7 04/07/2017   HGB 9.5 (L) 04/07/2017   HCT 27.4 (L) 04/07/2017   MCV 89.6 04/07/2017   PLT 300 04/07/2017    Lab Results  Component Value Date   CREATININE 0.80 04/07/2017    Lab Results  Component Value Date   AST 30 10/21/2016   Lab Results  Component Value Date   ALT 20 10/21/2016    I have reviewed the labs.   Assessment & Plan:    1. Urinary retention  - foley catheter exchanged today  - continue tamsulosin and finasteride  - RTC in one month for Foley catheter exchange  2. Overflow incontinence  - caused by urinary retention  3. Massive BPH  - explained to the granddaughter that he is not a good surgical candidate due to his co-morbidities and age   - continue the tamsulosin and finasteride  - explained that we may be looking  out permanent indwelling Foley vs CIC vs SPT - we discussed each option and patient is given more information regarding each option  Return in about 1 month (around 05/17/2017) for Foley exchange.  These notes generated with voice recognition software. I apologize for typographical errors.  Michiel Cowboy, PA-C  [facility] [address] [place], [place] [address] [phone]  Greater than 50% was spent in counseling & coordination of care with the patient with a 25 minute face to face conversation regarding his urinary retention.

## 2017-04-19 ENCOUNTER — Encounter: Payer: Self-pay | Admitting: Urology

## 2017-04-19 ENCOUNTER — Ambulatory Visit (INDEPENDENT_AMBULATORY_CARE_PROVIDER_SITE_OTHER): Payer: Medicare Other | Admitting: Urology

## 2017-04-19 VITALS — BP 117/69 | HR 80 | Ht 69.0 in | Wt 125.0 lb

## 2017-04-19 DIAGNOSIS — N401 Enlarged prostate with lower urinary tract symptoms: Secondary | ICD-10-CM

## 2017-04-19 DIAGNOSIS — R339 Retention of urine, unspecified: Secondary | ICD-10-CM | POA: Diagnosis not present

## 2017-04-19 DIAGNOSIS — N39490 Overflow incontinence: Secondary | ICD-10-CM

## 2017-04-19 DIAGNOSIS — N138 Other obstructive and reflux uropathy: Secondary | ICD-10-CM

## 2017-04-19 NOTE — Patient Instructions (Addendum)
Clean Intermittent Catheterization, Male Clean intermittent catheterization (CIC) is a procedure to remove urine from the bladder by placing a small, flexible tube (catheter) into the bladder though the urethra. The urethra is a tube in the body that carries urine from the bladder out of the body. CIC may be done when:  You cannot completely empty your bladder on your own. This may be due to a blockage in the bladder or urethra.  Your bladder leaks urine. This may happen when the muscles or nerves near the bladder are not working normally, so the bladder overflows.  Your health care provider will show you how to perform CIC and will help you to feel comfortable performing this procedure at home. Your health care provider will also help you to get the home care supplies that are needed for this procedure. What supplies will I need?  Germ-free (sterile), water-based lubricant.  A container for urine collection. You may also use the toilet to dispose of urine from the catheter.  A catheter. Your health care provider will determine the best size for you.  Sterile gloves.  Sterile gauze.  Medicated sterile swabs. How do I perform the procedure? Most people need CIC at least 4 times per day to adequately empty the bladder. Your health care provider will tell you how often you should perform CIC. To perform CIC, follow these steps: 1. Wash your hands with soap and water. If soap and water are not available, use hand sanitizer. 2. Prepare the supplies that you will use during the procedure. Open the catheter pack, the lubricant, and the pack of medicated sterile swabs. If you have been told to keep the procedure sterile, do not touch your supplies until you are wearing gloves. 3. Get in a comfortable position. Possible positions include: ? Sitting on a toilet, a chair, or the edge of a bed. ? Standing near a toilet. ? Lying down with your head raised on pillows and your knees pointing to the  ceiling. 4. If you are using a urine collection container, position it between your legs. 5. Urinate, if you are able. 6. Put on gloves. 7. Apply lubricant to about 2 inches (5 cm) of the tip of the catheter. 8. Set the catheter down on a clean, dry surface within reach. 9. Gently stretch your penis out from your body. Pull back any skin that covers the end of your penis (foreskin). Clean the end of your peniswith medicated sterile swabs as told by your health care provider. 10. Hold your penis upward at a 45-60 degree angle. This helps to straighten the urethra. 11. Slowly insert the lubricated catheter 2-3 inches (5-8 cm) straight into your urethra until urine flows freely. Allow urine to drain into the toilet or the urine collection container. 12. When urine starts to flow freely, insert the catheter 1 inch (3 cm) more. 13. When urine stops flowing, slowly remove the catheter. 14. Note the color, amount, and odor of the urine. 15. Clean your penis using soap and water. 15. Wash your hands with soap and water. 17. Follow package instructions about how to clean the catheter after each use.  What should I do at home? How Often Should I Perform CIC?  Do CIC to empty your bladder every 4-6 hours or as often as told by your health care provider.  If you have symptoms of too much urine in your bladder (overdistension) and you are not able to urinate, perform CIC. Symptoms of overdistension may include: ?  Restlessness. ? Sweating or chills. ? Headache. ? Flushed or pale skin. ? Cold limbs. ? Bloated lower abdomen. What Are Some Steps That I Can Take to Avoid Problems?  Drink enough fluid to keep your urine clear or pale yellow.  Avoid caffeine. Caffeine may make you urinate more frequently and more urgently.  Dispose of a multiple use catheter when it becomes dry, brittle, or cloudy. This usually happens after you use the catheter for 1 week.  Take over-the-counter and prescription  medicines only as told by your health care provider.  Keep all follow-up visits as told by your health care provider. This is important. Contact a health care provider if:  You have difficulty performing CIC.  You have urine leaking during CIC.  You have: ? Dark or cloudy urine. ? Blood in your urine or in your catheter. ? A change in the smell of your urine or discharge. ? A burning feeling while you urinate.  You feel nauseous or you vomit.  You have pain in your abdomen, your back, or your sides below your ribs.  You have swelling or redness around the opening of your urethra.  You develop a rash or sores on your skin. Get help right away if:  You have a fever.  You have symptoms that do not go away after 3 days.  You have symptoms that suddenly get worse.  You have severe pain.  The amount of urine that drains from your bladder decreases. This information is not intended to replace advice given to you by your health care provider. Make sure you discuss any questions you have with your health care provider. Document Released: 03/26/2010 Document Revised: 07/30/2015 Document Reviewed: 09/05/2014 Elsevier Interactive Patient Education  2018 [employer]  Indwelling Urinary Catheter Care, Adult Taking good care of your catheter will keep it working properly and help prevent problems from developing. How to wear your catheter Attach your catheter to your leg with adhesive tape or a leg strap. Make sure there is no tension on the catheter. If you use adhesive tape, first remove any sticky residue from the previous tape you used. How to wear a drainage bag  You should have received a large overnight drainage bag and a smaller leg bag that fits underneath clothing. You may wear the overnight bag at any time, but you should never wear the smaller leg bag at night.  Always keep the overnight drainage bag below the level of your bladder, but keep it off the floor. When you  sleep, hang the bag inside a wastebasket that is covered by a clean plastic bag.  Always wear the leg bag below your knee. Keep the leg bag secure with a leg strap or adhesive tape. How to care for your skin  Clean the skin around the catheter at least once every day.  Shower every day. Do not take baths.  Apply creams, lotions, or ointments to your genital area only as told by your health care provider.  Do not use powders, sprays, or lotions on your genital area. How to clean your catheter and your skin 1. Wash your hands with soap and water. 2. Wet a washcloth in warm water and mild soap. 3. Use the washcloth to clean the skin where the catheter enters your body. Clean downward, wiping away from the catheter in small circles. Do not wipe toward the catheter. This can push bacteria into the urethra and cause infection. 4. Pat the area dry with a clean towel.  Make sure to remove all soap. How to care for your drainage bags Empty your drainage bag when it is ?- full, or at least 2-3 times a day. Replace your drainage bag once a month or sooner if it starts to smell bad or look dirty. Do not clean your drainage bag unless told by your health care provider. Emptying a drainage bag  Supplies Needed  Rubbing alcohol.  Gauze pad or cotton ball.  Adhesive tape or a leg strap.  Steps 1. Wash your hands with soap and water. 2. Detach the drainage bag from your leg. 3. Hold the drainage bag over the toilet or a clean container. Keep the drainage bag below your hips and bladder. This stops urine from going back into the tubing and into your bladder. 4. Open the pour spout at the bottom of the bag. 5. Empty the urine into the toilet or container. Do not let the pour spout touch any surface. This helps keep bacteria out of the bag and helps prevent infection. 6. Apply rubbing alcohol to a gauze pad or cotton ball. 7. Use the gauze pad or cotton ball to clean the pour spout. 8. Close the  pour spout. 9. Attach the bag to your leg with adhesive tape or a leg strap. 10. Wash your hands.  Changing a drainage bag Supplies Needed  Alcohol wipes.  A clean drainage bag.  Adhesive tape or a leg strap.  Steps 1. Wash your hands with soap and water. 2. Detach the dirty drainage bag from your leg. 3. Pinch the rubber catheter with your fingers so that urine does not spill out. 4. Disconnect the catheter tube from the drainage tube at the connection valve. Do not let the tubes touch any surface. 5. Clean the end of the catheter tube with an alcohol wipe. Use a different alcohol wipe to clean the end of the drainage tube. 6. Connect the catheter tube to the drainage tube of the clean drainage bag. 7. Attach the new bag to your leg with adhesive tape or a leg strap. Avoid attaching the new bag too tightly. 8. Wash your hands.  How to prevent infection and other problems  Never pull on your catheter or try to remove it. Pulling can damage your internal tissues.  Always wash your hands before and after handling your catheter.  If a leg strap gets wet, replace it with a dry one.  Drink enough fluids to keep your urine clear or pale yellow, or as told by your health care provider.  Do not let the drainage bag or tubing touch the floor.  Wear cotton underwear. Cotton absorbs moisture and keeps your skin dry.  If you are male, wipe from front to back after each bowel movement.  Check the catheter often to make sure that it works properly and the tubing is not twisted or curled. Contact a health care provider if:  Your urine is cloudy.  Your urine smells unusually bad.  Your urine is not draining into the bag.  Your catheter gets clogged.  Your catheter starts to leak.  Your bladder feels full. Get help right away if:  You have redness, swelling, or pain where the catheter enters your body.  You have fluid, pus, or a bad smell coming from the area where the  catheter enters your body.  The area where the catheter enters your body feels warm to the touch.  You have a fever.  You have pain in your abdomen, legs,  lower back, or bladder.  You see blood fill the catheter.  Your urine is pink or red.  You have nausea, vomiting, or chills.  Your catheter gets pulled out. This information is not intended to replace advice given to you by your health care provider. Make sure you discuss any questions you have with your health care provider. Document Released: 02/21/2005 Document Revised: 01/20/2016 Document Reviewed: 08/06/2013 Elsevier Interactive Patient Education  2018 [employer]  Suprapubic Catheter Home Guide A suprapubic catheter is a rubber tube used to drain urine from the bladder into a collection bag. The catheter is inserted into the bladder through a small opening in the in the lower abdomen, near the center of the body, above the pubic bone (suprapubic area). There is a tiny balloon filled with germ-free (sterile) water on the end of the catheter that is in the bladder. The balloon helps to keep the catheter in place. Your suprapubic catheter may need to be replaced every 4-6 weeks, or as often as recommended by your health care provider. The collection bag must be emptied every day and cleaned every 2-3 days. The collection bag can be put beside your bed at night and attached to your leg during the day. You may have a large collection bag to use at night and a smaller one to use during the day. What are the risks?  Urine flow can become blocked. This can happen if the catheter is not working correctly, or if you have a blood clot in your bladder or in the catheter.  Tissue near the catheter may can become irritated and bleed.  Bacteria may get into your bladder and cause a urinary tract infection. How do I change the catheter? Supplies needed  Two pairs of sterile gloves.  Catheter.  Two syringes.  Sterile water.  Sterile  cleaning solution.  Lubricant.  Collection bags. Changing the catheter To replace your catheter, take the following steps: 1. Drink plenty of fluids during the hours before you plan to change the catheter. 2. Wash your hands with soap and water. If soap and water are not available, use hand sanitizer. 3. Lie on your back and put on sterile gloves. 4. Clean the skin around the catheter opening using the sterile cleaning solution. 5. Remove the water from the balloon using a syringe. 6. Slowly remove the catheter. ? Do not pull on the catheter if it seems stuck. ? Call your health care provider immediately if you have difficulty removing the catheter. 7. Take off the used gloves, and put on a new pair. 8. Put lubricant on the end of the new catheter that will go into your bladder. 9. Gently slide the catheter through the opening in your abdomen and into your bladder. 10. Wait for some urine to start flowing through the catheter. When urine starts to flow through the catheter, use a new syringe to fill the balloon with sterile water. 11. Attach the collection bag to the end of the catheter. Make sure the connection is tight. 12. Remove the gloves and wash your hands with soap and water.  How do I care for my skin around the catheter? Use a clean washcloth and soapy water to clean the skin around your catheter every day. Pat the area dry with a clean towel.  Do not pull on the catheter.  Do not use ointment or lotion on this area unless told by your health care provider.  Check your skin around the catheter every day for  signs of infection. Check for: ? Redness, swelling, or pain. ? Fluid or blood. ? Warmth. ? Pus or a bad smell.  How do I clean and empty the collection bag? Clean the collection bag every 2-3 days, or as often as told by your health care provider. To do this, take the following steps:  Wash your hands with soap and water. If soap and water are not available, use  hand sanitizer.  Disconnect the bag from the catheter and immediately attach a new bag to the catheter.  Empty the used bag completely.  Clean the used bag using one of the following methods: ? Rinse the used bag with warm water and soap. ? Fill the bag with water and add 1 tsp of vinegar. Let it sit for about 30 minutes, then empty the bag.  Let the bag dry completely, and put it in a clean plastic bag before storing it.  Empty the large collection bag every 8 hours. Empty the small collection bag when it is about ? full. To empty your large or small collection bag, take the following steps:  Always keep the bag below the level of the catheter. This keeps urine from flowing backwards into the catheter.  Hold the bag over the toilet or another container. Turn the valve (spigot) at the bottom of the bag to empty the urine. ? Do not touch the opening of the spigot. ? Do not let the opening touch the toilet or container.  Close the spigot tightly when the bag is empty.  What are some general tips?  Always wash your hands before and after caring for your catheter and collection bag. Use a mild, fragrance-free soap. If soap and water are not available, use hand sanitizer.  Clean the catheter with soap and water as often as told by your health care provider.  Always make sure there are no twists or curls (kinks) in the catheter tube.  Always make sure there are no leaks in the catheter or collection bag.  Drink enough fluid to keep your urine clear or pale yellow.  Do not take baths, swim, or use a hot tub. When should I seek medical care? Seek medical care if:  You leak urine.  You have redness, swelling, or pain around your catheter opening.  You have fluid or blood coming from your catheter opening.  Your catheter opening feels warm to the touch.  You have pus or a bad smell coming from your catheter opening.  You have a fever or chills.  Your urine flow slows  down.  Your urine becomes cloudy or smelly.  When should I seek immediate medical care? Seek immediate medical care if your catheter comes out, or if you have:  Nausea.  Back pain.  Difficulty changing your catheter.  Blood in your urine.  No urine flow for 1 hour.  This information is not intended to replace advice given to you by your health care provider. Make sure you discuss any questions you have with your health care provider. Document Released: 11/09/2010 Document Revised: 10/21/2015 Document Reviewed: 11/04/2014 Elsevier Interactive Patient Education  2018 [employer].

## 2017-04-28 ENCOUNTER — Emergency Department: Payer: Medicare Other

## 2017-04-28 ENCOUNTER — Inpatient Hospital Stay
Admission: EM | Admit: 2017-04-28 | Discharge: 2017-05-03 | DRG: 643 | Disposition: A | Discharge status: Hospice | Payer: Medicare Other | Attending: Internal Medicine | Admitting: Internal Medicine

## 2017-04-28 ENCOUNTER — Other Ambulatory Visit: Payer: Self-pay

## 2017-04-28 DIAGNOSIS — G8929 Other chronic pain: Secondary | ICD-10-CM | POA: Diagnosis present

## 2017-04-28 DIAGNOSIS — Z515 Encounter for palliative care: Secondary | ICD-10-CM | POA: Diagnosis not present

## 2017-04-28 DIAGNOSIS — T83511A Infection and inflammatory reaction due to indwelling urethral catheter, initial encounter: Secondary | ICD-10-CM | POA: Diagnosis present

## 2017-04-28 DIAGNOSIS — E872 Acidosis: Secondary | ICD-10-CM | POA: Diagnosis not present

## 2017-04-28 DIAGNOSIS — Z66 Do not resuscitate: Secondary | ICD-10-CM | POA: Diagnosis present

## 2017-04-28 DIAGNOSIS — E785 Hyperlipidemia, unspecified: Secondary | ICD-10-CM | POA: Diagnosis present

## 2017-04-28 DIAGNOSIS — G9341 Metabolic encephalopathy: Secondary | ICD-10-CM | POA: Diagnosis present

## 2017-04-28 DIAGNOSIS — K219 Gastro-esophageal reflux disease without esophagitis: Secondary | ICD-10-CM | POA: Diagnosis present

## 2017-04-28 DIAGNOSIS — I5032 Chronic diastolic (congestive) heart failure: Secondary | ICD-10-CM | POA: Diagnosis present

## 2017-04-28 DIAGNOSIS — E222 Syndrome of inappropriate secretion of antidiuretic hormone: Principal | ICD-10-CM | POA: Diagnosis present

## 2017-04-28 DIAGNOSIS — I110 Hypertensive heart disease with heart failure: Secondary | ICD-10-CM | POA: Diagnosis present

## 2017-04-28 DIAGNOSIS — K5900 Constipation, unspecified: Secondary | ICD-10-CM | POA: Diagnosis not present

## 2017-04-28 DIAGNOSIS — B952 Enterococcus as the cause of diseases classified elsewhere: Secondary | ICD-10-CM | POA: Diagnosis present

## 2017-04-28 DIAGNOSIS — N390 Urinary tract infection, site not specified: Secondary | ICD-10-CM | POA: Diagnosis present

## 2017-04-28 DIAGNOSIS — K567 Ileus, unspecified: Secondary | ICD-10-CM | POA: Diagnosis not present

## 2017-04-28 DIAGNOSIS — F0390 Unspecified dementia without behavioral disturbance: Secondary | ICD-10-CM | POA: Diagnosis present

## 2017-04-28 DIAGNOSIS — L8990 Pressure ulcer of unspecified site, unspecified stage: Secondary | ICD-10-CM | POA: Diagnosis present

## 2017-04-28 DIAGNOSIS — R627 Adult failure to thrive: Secondary | ICD-10-CM | POA: Diagnosis present

## 2017-04-28 DIAGNOSIS — R64 Cachexia: Secondary | ICD-10-CM | POA: Diagnosis present

## 2017-04-28 DIAGNOSIS — E43 Unspecified severe protein-calorie malnutrition: Secondary | ICD-10-CM | POA: Diagnosis present

## 2017-04-28 DIAGNOSIS — I10 Essential (primary) hypertension: Secondary | ICD-10-CM | POA: Diagnosis present

## 2017-04-28 DIAGNOSIS — B9689 Other specified bacterial agents as the cause of diseases classified elsewhere: Secondary | ICD-10-CM | POA: Diagnosis present

## 2017-04-28 DIAGNOSIS — R1110 Vomiting, unspecified: Secondary | ICD-10-CM

## 2017-04-28 DIAGNOSIS — Z79899 Other long term (current) drug therapy: Secondary | ICD-10-CM

## 2017-04-28 DIAGNOSIS — I82412 Acute embolism and thrombosis of left femoral vein: Secondary | ICD-10-CM | POA: Diagnosis present

## 2017-04-28 DIAGNOSIS — Z7189 Other specified counseling: Secondary | ICD-10-CM

## 2017-04-28 DIAGNOSIS — N138 Other obstructive and reflux uropathy: Secondary | ICD-10-CM | POA: Diagnosis present

## 2017-04-28 DIAGNOSIS — F03A0 Unspecified dementia, mild, without behavioral disturbance, psychotic disturbance, mood disturbance, and anxiety: Secondary | ICD-10-CM

## 2017-04-28 DIAGNOSIS — N401 Enlarged prostate with lower urinary tract symptoms: Secondary | ICD-10-CM | POA: Diagnosis present

## 2017-04-28 DIAGNOSIS — R600 Localized edema: Secondary | ICD-10-CM | POA: Diagnosis present

## 2017-04-28 DIAGNOSIS — R338 Other retention of urine: Secondary | ICD-10-CM | POA: Diagnosis present

## 2017-04-28 DIAGNOSIS — E119 Type 2 diabetes mellitus without complications: Secondary | ICD-10-CM

## 2017-04-28 DIAGNOSIS — Z5320 Procedure and treatment not carried out because of patient's decision for unspecified reasons: Secondary | ICD-10-CM

## 2017-04-28 DIAGNOSIS — T502X5A Adverse effect of carbonic-anhydrase inhibitors, benzothiadiazides and other diuretics, initial encounter: Secondary | ICD-10-CM | POA: Diagnosis present

## 2017-04-28 DIAGNOSIS — G9340 Encephalopathy, unspecified: Secondary | ICD-10-CM

## 2017-04-28 DIAGNOSIS — E876 Hypokalemia: Secondary | ICD-10-CM | POA: Diagnosis present

## 2017-04-28 DIAGNOSIS — Z9181 History of falling: Secondary | ICD-10-CM

## 2017-04-28 DIAGNOSIS — E871 Hypo-osmolality and hyponatremia: Secondary | ICD-10-CM

## 2017-04-28 DIAGNOSIS — Z681 Body mass index (BMI) 19 or less, adult: Secondary | ICD-10-CM

## 2017-04-28 LAB — HEPATIC FUNCTION PANEL
ALBUMIN: 3.2 g/dL — AB (ref 3.5–5.0)
ALT: 37 U/L (ref 17–63)
AST: 61 U/L — ABNORMAL HIGH (ref 15–41)
Alkaline Phosphatase: 65 U/L (ref 38–126)
BILIRUBIN DIRECT: 0.4 mg/dL (ref 0.1–0.5)
BILIRUBIN INDIRECT: 1.4 mg/dL — AB (ref 0.3–0.9)
BILIRUBIN TOTAL: 1.8 mg/dL — AB (ref 0.3–1.2)
Total Protein: 5.8 g/dL — ABNORMAL LOW (ref 6.5–8.1)

## 2017-04-28 LAB — BASIC METABOLIC PANEL
Anion gap: 12 (ref 5–15)
BUN: 24 mg/dL — AB (ref 6–20)
CO2: 23 mmol/L (ref 22–32)
Calcium: 7.8 mg/dL — ABNORMAL LOW (ref 8.9–10.3)
Chloride: 78 mmol/L — ABNORMAL LOW (ref 101–111)
Creatinine, Ser: 1.01 mg/dL (ref 0.61–1.24)
GFR calc Af Amer: >60 mL/min (ref >60)
GLUCOSE: 182 mg/dL — AB (ref 65–99)
POTASSIUM: 2.8 mmol/L — AB (ref 3.5–5.1)
Sodium: 113 mmol/L — CL (ref 135–145)

## 2017-04-28 LAB — CBC
HEMATOCRIT: 34.5 % — AB (ref 40.0–52.0)
Hemoglobin: 12.4 g/dL — ABNORMAL LOW (ref 13.0–18.0)
MCH: 31.1 pg (ref 26.0–34.0)
MCHC: 35.9 g/dL (ref 32.0–36.0)
MCV: 86.6 fL (ref 80.0–100.0)
Platelets: 367 10*3/uL (ref 150–440)
RBC: 3.98 MIL/uL — ABNORMAL LOW (ref 4.40–5.90)
RDW: 13.9 % (ref 11.5–14.5)
WBC: 10.6 10*3/uL (ref 3.8–10.6)

## 2017-04-28 MED ORDER — SODIUM CHLORIDE 0.9 % IV BOLUS (SEPSIS)
1000.0000 mL | Freq: Once | INTRAVENOUS | Status: AC
  Administered 2017-04-29: 1000 mL via INTRAVENOUS

## 2017-04-28 NOTE — ED Triage Notes (Signed)
Pt arrives to ED via POV from home with c/o weakness, bilateral LL edema, and possible UTI. Pt's granddaughter reports pt seen by PCP on Wednesday and r/x'd Bactrim DS BID. Pt reports nausea, but denies vomiting. No reports of diarrhea or fever. Pt states he's been feeling like he's "going to pass out". Pt also has chronic indwelling catheter for enlarged prostate. Pt is alert, in NAD; RR even, regular, and unlabored.

## 2017-04-28 NOTE — ED Provider Notes (Signed)
[facility] Emergency Department Provider Note    None    (approximate)  I have reviewed the triage vital signs and the nursing notes.   HISTORY  Chief Complaint Weakness    HPI Cindie LarocheClarence J Pronovost is a 84 y.o. male with a history of BPH, diabetes hypertension hyperlipidemia with a chronic indwelling Foley presents for generalized weakness and worsening lower extremity swelling.  Bedside the patient is not eating anything for the past several days.  States that she is convinced that he has UTI which she frequently gets with his indwelling Foley.  No measured fevers.  No nausea or vomiting and no diarrhea.  She simply cannot convince him to eat.  He is more confused than normal and has had difficulty walking due to diffuse weakness.  Past Medical History:  Diagnosis Date  . BPH (benign prostatic hyperplasia)   . Diabetes mellitus without complication (HCC)   . GERD (gastroesophageal reflux disease)   . HLD (hyperlipidemia)   . Hypertension    Family History  Problem Relation Age of Onset  . Diabetes Father   . Hypertension Father   . Prostate cancer Father    Past Surgical History:  Procedure Laterality Date  . HIP FRACTURE SURGERY    . KNEE SURGERY     Patient Active Problem List   Diagnosis Date Noted  . Protein-calorie malnutrition, severe 03/20/2017  . UTI (urinary tract infection) 03/17/2017  . Hyponatremia 03/17/2017  . BPH with obstruction/lower urinary tract symptoms 03/17/2017  . Diabetes (HCC) 03/17/2017  . HTN (hypertension) 03/17/2017  . GERD (gastroesophageal reflux disease) 03/17/2017  . HLD (hyperlipidemia) 03/17/2017      Prior to Admission medications   Medication Sig Start Date End Date Taking? Authorizing Provider  atorvastatin (LIPITOR) 20 MG tablet Take 20 mg by mouth daily.   Yes [provider]  finasteride (PROSCAR) 5 MG tablet Take 1 tablet (5 mg total) by mouth daily. 02/15/17  Yes McGowan, Carollee HerterShannon A,  PA-C  furosemide (LASIX) 20 MG tablet Take 20 mg by mouth daily.   Yes [provider]  latanoprost (XALATAN) 0.005 % ophthalmic solution Place 1 drop into both eyes at bedtime.   Yes [provider]  potassium chloride (K-DUR,KLOR-CON) 10 MEQ tablet Take 10 mEq by mouth daily.   Yes [provider]  sodium chloride 1 g tablet Take 1 tablet (1 g total) by mouth 2 (two) times daily with a meal. 04/07/17  Yes Enid BaasKalisetti, Radhika, MD  feeding supplement, ENSURE ENLIVE, (ENSURE ENLIVE) LIQD Take 237 mLs by mouth 2 (two) times daily between meals. Patient not taking: Reported on 04/28/2017 04/07/17   Enid BaasKalisetti, Radhika, MD  lisinopril (PRINIVIL,ZESTRIL) 10 MG tablet Take 1 tablet (10 mg total) by mouth daily. 03/20/17 04/19/17  Milagros LollSudini, Srikar, MD  tamsulosin (FLOMAX) 0.4 MG CAPS capsule Take 1 capsule (0.4 mg total) by mouth daily. Patient not taking: Reported on 04/28/2017 02/15/17   Michiel CowboyMcGowan, Shannon A, PA-C    Allergies Patient has no known allergies.    Social History Social History   Tobacco Use  . Smoking status: Never Smoker  . Smokeless tobacco: Never Used  Substance Use Topics  . Alcohol use: No  . Drug use: No    Review of Systems Patient denies headaches, rhinorrhea, blurry vision, numbness, shortness of breath, chest pain, edema, cough, abdominal pain, nausea, vomiting, diarrhea, dysuria, fevers, rashes or hallucinations unless otherwise stated above in HPI. ____________________________________________   PHYSICAL EXAM:  VITAL SIGNS: Vitals:  04/28/17 2219 04/28/17 2230  BP:  (!) 144/77  Pulse: 85 85  Resp: 20 18  Temp:    SpO2: 100% 100%    Constitutional: very ill appearing in no acute respiratory distress. Eyes: sunken. Conjunctivae are normal.  Head: Atraumatic. Nose: No congestion/rhinnorhea. Mouth/Throat: Mucous membranes are dry.   Neck: No stridor. Painless ROM.  Cardiovascular: Normal rate, regular rhythm. Grossly normal heart  sounds.  Delayed cap refill Respiratory: Normal respiratory effort.  No retractions. Lungs CTAB. Gastrointestinal: Soft and nontender. No distention. No abdominal bruits. No CVA tenderness. Genitourinary:  Musculoskeletal: No lower extremity tenderness, + bilateral LE edema.  No joint effusions. Neurologic:   No gross focal neurologic deficits are appreciated. No facial droop Skin:  Skin is warm, dry and intact, frail with + skin tenting. No rash noted.   ____________________________________________   LABS (all labs ordered are listed, but only abnormal results are displayed)  Results for orders placed or performed during the hospital encounter of 04/28/17 (from the past 24 hour(s))  Basic metabolic panel     Status: Abnormal   Collection Time: 04/28/17  9:51 PM  Result Value Ref Range   Sodium 113 (LL) 135 - 145 mmol/L   Potassium 2.8 (L) 3.5 - 5.1 mmol/L   Chloride 78 (L) 101 - 111 mmol/L   CO2 23 22 - 32 mmol/L   Glucose, Bld 182 (H) 65 - 99 mg/dL   BUN 24 (H) 6 - 20 mg/dL   Creatinine, Ser 1.61 0.61 - 1.24 mg/dL   Calcium 7.8 (L) 8.9 - 10.3 mg/dL   GFR calc non Af Amer >60 >60 mL/min   GFR calc Af Amer >60 >60 mL/min   Anion gap 12 5 - 15  CBC     Status: Abnormal   Collection Time: 04/28/17  9:51 PM  Result Value Ref Range   WBC 10.6 3.8 - 10.6 K/uL   RBC 3.98 (L) 4.40 - 5.90 MIL/uL   Hemoglobin 12.4 (L) 13.0 - 18.0 g/dL   HCT 09.6 (L) 04.5 - 40.9 %   MCV 86.6 80.0 - 100.0 fL   MCH 31.1 26.0 - 34.0 pg   MCHC 35.9 32.0 - 36.0 g/dL   RDW 81.1 91.4 - 78.2 %   Platelets 367 150 - 440 K/uL  Hepatic function panel     Status: Abnormal   Collection Time: 04/28/17  9:51 PM  Result Value Ref Range   Total Protein 5.8 (L) 6.5 - 8.1 g/dL   Albumin 3.2 (L) 3.5 - 5.0 g/dL   AST 61 (H) 15 - 41 U/L   ALT 37 17 - 63 U/L   Alkaline Phosphatase 65 38 - 126 U/L   Total Bilirubin 1.8 (H) 0.3 - 1.2 mg/dL   Bilirubin, Direct 0.4 0.1 - 0.5 mg/dL   Indirect Bilirubin 1.4 (H) 0.3 - 0.9  mg/dL   ____________________________________________  EKG My review and personal interpretation at Time:   21:28 Indication: weakness  Rate: 100  Rhythm: sinus Axis: normal Other:  Non speicifc st changes, no stemi, requent PAC ____________________________________________  RADIOLOGY  I personally reviewed all radiographic images ordered to evaluate for the above acute complaints and reviewed radiology reports and findings.  These findings were personally discussed with the patient.  Please see medical record for radiology report.  ____________________________________________   PROCEDURES  Procedure(s) performed:  .Critical Care Performed by: Willy Eddy, MD Authorized by: Willy Eddy, MD   Critical care provider statement:    Critical care time (  minutes):  35   Critical care time was exclusive of:  Separately billable procedures and treating other patients   Critical care was necessary to treat or prevent imminent or life-threatening deterioration of the following conditions:  Metabolic crisis   Critical care was time spent personally by me on the following activities:  Development of treatment plan with patient or surrogate, discussions with consultants, evaluation of patient's response to treatment, examination of patient, obtaining history from patient or surrogate, ordering and performing treatments and interventions, ordering and review of laboratory studies, ordering and review of radiographic studies, pulse oximetry, re-evaluation of patient's condition and review of old charts      Critical Care performed: yes ____________________________________________   INITIAL IMPRESSION / ASSESSMENT AND PLAN / ED COURSE  Pertinent labs & imaging results that were available during my care of the patient were reviewed by me and considered in my medical decision making (see chart for details).  DDX: Dehydration, sepsis, pna, uti, hypoglycemia, cva, drug effect,  withdrawal, encephalitis   DOMANIC MATUSEK is a 84 y.o. who presents to the ED with weakness as described above.  Patient is profoundly dehydrated and will receive IV fluids for resuscitation.  Abdominal exam is soft and benign.  Does have some lower extremity edema may be secondary to anasarca but will order ultrasound to evaluate for DVT.  Will check urine to evaluate for infectious process.  Patient will require admission the hospital for acute hyponatremia with weakness and confusion.  Have discussed with the patient and available family all diagnostics and treatments performed thus far and all questions were answered to the best of my ability. The patient demonstrates understanding and agreement with plan.       ____________________________________________   FINAL CLINICAL IMPRESSION(S) / ED DIAGNOSES  Final diagnoses:  Acute hyponatremia  Acute encephalopathy      NEW MEDICATIONS STARTED DURING THIS VISIT:  New Prescriptions   No medications on file     Note:  This document was prepared using Dragon voice recognition software and may include unintentional dictation errors.    Willy Eddy, MD 04/28/17 [id number]

## 2017-04-28 NOTE — ED Notes (Signed)
Pt taken to radiology

## 2017-04-28 NOTE — ED Notes (Signed)
Date and time results received: 04/28/17 10:27 PM   Test: Na Critical Value: 113  Name of Provider Notified: Dr. Roxan Hockeyobinson  Orders Received? Or Actions Taken?: Results acknowledged

## 2017-04-29 ENCOUNTER — Inpatient Hospital Stay: Payer: Medicare Other

## 2017-04-29 ENCOUNTER — Encounter: Payer: Self-pay | Admitting: Radiology

## 2017-04-29 ENCOUNTER — Inpatient Hospital Stay
Admit: 2017-04-29 | Discharge: 2017-04-29 | Disposition: A | Discharge status: Home / Self Care | Payer: Medicare Other | Attending: Internal Medicine | Admitting: Internal Medicine

## 2017-04-29 ENCOUNTER — Other Ambulatory Visit: Payer: Self-pay

## 2017-04-29 DIAGNOSIS — E785 Hyperlipidemia, unspecified: Secondary | ICD-10-CM | POA: Diagnosis present

## 2017-04-29 DIAGNOSIS — N390 Urinary tract infection, site not specified: Secondary | ICD-10-CM | POA: Diagnosis not present

## 2017-04-29 DIAGNOSIS — B952 Enterococcus as the cause of diseases classified elsewhere: Secondary | ICD-10-CM | POA: Diagnosis present

## 2017-04-29 DIAGNOSIS — R64 Cachexia: Secondary | ICD-10-CM | POA: Diagnosis present

## 2017-04-29 DIAGNOSIS — L8990 Pressure ulcer of unspecified site, unspecified stage: Secondary | ICD-10-CM | POA: Diagnosis present

## 2017-04-29 DIAGNOSIS — R338 Other retention of urine: Secondary | ICD-10-CM | POA: Diagnosis present

## 2017-04-29 DIAGNOSIS — G8929 Other chronic pain: Secondary | ICD-10-CM | POA: Diagnosis present

## 2017-04-29 DIAGNOSIS — K219 Gastro-esophageal reflux disease without esophagitis: Secondary | ICD-10-CM | POA: Diagnosis present

## 2017-04-29 DIAGNOSIS — K567 Ileus, unspecified: Secondary | ICD-10-CM | POA: Diagnosis not present

## 2017-04-29 DIAGNOSIS — Z681 Body mass index (BMI) 19 or less, adult: Secondary | ICD-10-CM | POA: Diagnosis not present

## 2017-04-29 DIAGNOSIS — Z515 Encounter for palliative care: Secondary | ICD-10-CM | POA: Diagnosis not present

## 2017-04-29 DIAGNOSIS — I5032 Chronic diastolic (congestive) heart failure: Secondary | ICD-10-CM | POA: Diagnosis present

## 2017-04-29 DIAGNOSIS — E43 Unspecified severe protein-calorie malnutrition: Secondary | ICD-10-CM | POA: Diagnosis not present

## 2017-04-29 DIAGNOSIS — Z7189 Other specified counseling: Secondary | ICD-10-CM | POA: Diagnosis not present

## 2017-04-29 DIAGNOSIS — E872 Acidosis: Secondary | ICD-10-CM | POA: Diagnosis not present

## 2017-04-29 DIAGNOSIS — E119 Type 2 diabetes mellitus without complications: Secondary | ICD-10-CM | POA: Diagnosis present

## 2017-04-29 DIAGNOSIS — F0390 Unspecified dementia without behavioral disturbance: Secondary | ICD-10-CM | POA: Diagnosis present

## 2017-04-29 DIAGNOSIS — E222 Syndrome of inappropriate secretion of antidiuretic hormone: Secondary | ICD-10-CM | POA: Diagnosis present

## 2017-04-29 DIAGNOSIS — N401 Enlarged prostate with lower urinary tract symptoms: Secondary | ICD-10-CM | POA: Diagnosis not present

## 2017-04-29 DIAGNOSIS — K5900 Constipation, unspecified: Secondary | ICD-10-CM | POA: Diagnosis not present

## 2017-04-29 DIAGNOSIS — G9341 Metabolic encephalopathy: Secondary | ICD-10-CM | POA: Diagnosis present

## 2017-04-29 DIAGNOSIS — I82412 Acute embolism and thrombosis of left femoral vein: Secondary | ICD-10-CM | POA: Diagnosis present

## 2017-04-29 DIAGNOSIS — E871 Hypo-osmolality and hyponatremia: Secondary | ICD-10-CM | POA: Diagnosis not present

## 2017-04-29 DIAGNOSIS — T83511A Infection and inflammatory reaction due to indwelling urethral catheter, initial encounter: Secondary | ICD-10-CM | POA: Diagnosis present

## 2017-04-29 DIAGNOSIS — I110 Hypertensive heart disease with heart failure: Secondary | ICD-10-CM | POA: Diagnosis present

## 2017-04-29 DIAGNOSIS — B9689 Other specified bacterial agents as the cause of diseases classified elsewhere: Secondary | ICD-10-CM | POA: Diagnosis present

## 2017-04-29 DIAGNOSIS — T502X5A Adverse effect of carbonic-anhydrase inhibitors, benzothiadiazides and other diuretics, initial encounter: Secondary | ICD-10-CM | POA: Diagnosis present

## 2017-04-29 DIAGNOSIS — N138 Other obstructive and reflux uropathy: Secondary | ICD-10-CM | POA: Diagnosis not present

## 2017-04-29 LAB — URINALYSIS, COMPLETE (UACMP) WITH MICROSCOPIC
Bilirubin Urine: NEGATIVE
GLUCOSE, UA: NEGATIVE mg/dL
Ketones, ur: 5 mg/dL — AB
Nitrite: NEGATIVE
PROTEIN: NEGATIVE mg/dL
Specific Gravity, Urine: 1.012 (ref 1.005–1.030)
pH: 5 (ref 5.0–8.0)

## 2017-04-29 LAB — SODIUM
Sodium: 117 mmol/L — CL (ref 135–145)
Sodium: 116 mmol/L — CL (ref 135–145)

## 2017-04-29 LAB — CBC
HCT: 34.5 % — ABNORMAL LOW (ref 40.0–52.0)
Hemoglobin: 12 g/dL — ABNORMAL LOW (ref 13.0–18.0)
MCH: 30.3 pg (ref 26.0–34.0)
MCHC: 34.7 g/dL (ref 32.0–36.0)
MCV: 87.5 fL (ref 80.0–100.0)
Platelets: 308 10*3/uL (ref 150–440)
RBC: 3.94 MIL/uL — AB (ref 4.40–5.90)
RDW: 14.2 % (ref 11.5–14.5)
WBC: 11.8 10*3/uL — AB (ref 3.8–10.6)

## 2017-04-29 LAB — BASIC METABOLIC PANEL
ANION GAP: 13 (ref 5–15)
BUN: 21 mg/dL — AB (ref 6–20)
CO2: 18 mmol/L — ABNORMAL LOW (ref 22–32)
Calcium: 7.2 mg/dL — ABNORMAL LOW (ref 8.9–10.3)
Chloride: 83 mmol/L — ABNORMAL LOW (ref 101–111)
Creatinine, Ser: 0.72 mg/dL (ref 0.61–1.24)
GFR calc Af Amer: >60 mL/min (ref >60)
GFR calc non Af Amer: >60 mL/min (ref >60)
Glucose, Bld: 132 mg/dL — ABNORMAL HIGH (ref 65–99)
POTASSIUM: 2.8 mmol/L — AB (ref 3.5–5.1)
SODIUM: 114 mmol/L — AB (ref 135–145)

## 2017-04-29 LAB — GLUCOSE, CAPILLARY
GLUCOSE-CAPILLARY: 126 mg/dL — AB (ref 65–99)
GLUCOSE-CAPILLARY: 175 mg/dL — AB (ref 65–99)
Glucose-Capillary: 166 mg/dL — ABNORMAL HIGH (ref 65–99)
Glucose-Capillary: 147 mg/dL — ABNORMAL HIGH (ref 65–99)

## 2017-04-29 LAB — LACTIC ACID, PLASMA: LACTIC ACID, VENOUS: 1.8 mmol/L (ref 0.5–1.9)

## 2017-04-29 LAB — TSH: TSH: 2.982 u[IU]/mL (ref 0.350–4.500)

## 2017-04-29 LAB — PSA: PROSTATIC SPECIFIC ANTIGEN: 0.25 ng/mL (ref 0.00–4.00)

## 2017-04-29 LAB — MAGNESIUM: Magnesium: 1 mg/dL — ABNORMAL LOW (ref 1.7–2.4)

## 2017-04-29 MED ORDER — ACETAMINOPHEN 650 MG RE SUPP: 650.0000 mg | Freq: Four times a day (QID) | RECTAL | Status: DC | PRN

## 2017-04-29 MED ORDER — INSULIN ASPART 100 UNIT/ML ~~LOC~~ SOLN
0.0000 [IU] | Freq: Three times a day (TID) | SUBCUTANEOUS | Status: DC
  Administered 2017-04-29: 13:00:00 2 [IU] via SUBCUTANEOUS
  Administered 2017-04-29: 1 [IU] via SUBCUTANEOUS
  Administered 2017-04-30 (×2): 2 [IU] via SUBCUTANEOUS
  Administered 2017-04-30: 08:00:00 1 [IU] via SUBCUTANEOUS
  Administered 2017-05-01 (×3): 2 [IU] via SUBCUTANEOUS
  Administered 2017-05-02 (×2): 1 [IU] via SUBCUTANEOUS
  Administered 2017-05-03 (×2): 2 [IU] via SUBCUTANEOUS
  Filled 2017-04-29 (×13): qty 1

## 2017-04-29 MED ORDER — POTASSIUM CHLORIDE 10 MEQ/100ML IV SOLN
10.0000 meq | INTRAVENOUS | Status: DC
  Administered 2017-04-29 (×2): 10 meq via INTRAVENOUS
  Filled 2017-04-29 (×2): qty 100

## 2017-04-29 MED ORDER — FINASTERIDE 5 MG PO TABS
5.0000 mg | ORAL_TABLET | Freq: Every day | ORAL | Status: DC
  Administered 2017-04-29 – 2017-05-03 (×4): 5 mg via ORAL
  Filled 2017-04-29 (×5): qty 1

## 2017-04-29 MED ORDER — ONDANSETRON HCL 4 MG PO TABS: 4.0000 mg | ORAL_TABLET | Freq: Four times a day (QID) | ORAL | Status: DC | PRN

## 2017-04-29 MED ORDER — SODIUM CHLORIDE 0.9 % IV SOLN
2.0000 g | INTRAVENOUS | Status: DC
  Administered 2017-04-30: 08:00:00 2 g via INTRAVENOUS
  Filled 2017-04-29: qty 20

## 2017-04-29 MED ORDER — SODIUM CHLORIDE 1 G PO TABS
1.0000 g | ORAL_TABLET | Freq: Two times a day (BID) | ORAL | Status: DC
  Administered 2017-04-30 – 2017-05-03 (×4): 1 g via ORAL
  Filled 2017-04-29 (×8): qty 1

## 2017-04-29 MED ORDER — ENSURE ENLIVE PO LIQD
237.0000 mL | Freq: Two times a day (BID) | ORAL | Status: DC
  Administered 2017-04-29 – 2017-04-30 (×2): 237 mL via ORAL

## 2017-04-29 MED ORDER — ACETAMINOPHEN 325 MG PO TABS: 650.0000 mg | ORAL_TABLET | Freq: Four times a day (QID) | ORAL | Status: DC | PRN

## 2017-04-29 MED ORDER — ATORVASTATIN CALCIUM 20 MG PO TABS
20.0000 mg | ORAL_TABLET | Freq: Every day | ORAL | Status: DC
Start: 2017-04-29 — End: 2017-05-03
  Administered 2017-04-29 – 2017-05-03 (×4): 20 mg via ORAL
  Filled 2017-04-29 (×4): qty 1

## 2017-04-29 MED ORDER — CEFTRIAXONE SODIUM 2 G IJ SOLR
2.0000 g | Freq: Once | INTRAMUSCULAR | Status: AC
  Administered 2017-04-29: 2 g via INTRAVENOUS
  Filled 2017-04-29: qty 20

## 2017-04-29 MED ORDER — POTASSIUM CHLORIDE IN NACL 20-0.9 MEQ/L-% IV SOLN
INTRAVENOUS | Status: DC
  Administered 2017-04-29 – 2017-05-02 (×4): via INTRAVENOUS
  Filled 2017-04-29 (×7): qty 1000

## 2017-04-29 MED ORDER — ENOXAPARIN SODIUM 40 MG/0.4ML ~~LOC~~ SOLN
40.0000 mg | SUBCUTANEOUS | Status: DC
  Administered 2017-04-29 – 2017-05-01 (×3): 40 mg via SUBCUTANEOUS
  Filled 2017-04-29 (×3): qty 0.4

## 2017-04-29 MED ORDER — INSULIN ASPART 100 UNIT/ML ~~LOC~~ SOLN: 0.0000 [IU] | Freq: Every day | SUBCUTANEOUS | Status: DC

## 2017-04-29 MED ORDER — ONDANSETRON HCL 4 MG/2ML IJ SOLN
4.0000 mg | Freq: Four times a day (QID) | INTRAMUSCULAR | Status: DC | PRN
  Administered 2017-05-01 (×2): 4 mg via INTRAVENOUS
  Filled 2017-04-29 (×2): qty 2

## 2017-04-29 MED ORDER — LATANOPROST 0.005 % OP SOLN
1.0000 [drp] | Freq: Every day | OPHTHALMIC | Status: DC
  Administered 2017-04-29 – 2017-05-02 (×4): 1 [drp] via OPHTHALMIC
  Filled 2017-04-29 (×2): qty 2.5

## 2017-04-29 MED ORDER — SODIUM CHLORIDE 0.9 % IV SOLN
INTRAVENOUS | Status: DC
  Administered 2017-04-29: 05:00:00 via INTRAVENOUS

## 2017-04-29 MED ORDER — IOPAMIDOL (ISOVUE-300) INJECTION 61%
75.0000 mL | Freq: Once | INTRAVENOUS | Status: AC | PRN
  Administered 2017-04-29: 16:00:00 75 mL via INTRAVENOUS

## 2017-04-29 NOTE — Progress Notes (Signed)
Pharmacy Antibiotic Note  Christian LarocheClarence J [name] is a 84 y.o. male admitted on 04/28/2017 with UTI.  Pharmacy has been consulted for ceftriaxone dosing.  Plan: Ceftriaxone 2 grams q 24 hours ordered.  Height: 5\' 9"  (175.3 cm) Weight: 125 lb (56.7 kg) IBW/kg (Calculated) : 70.7  Temp (24hrs), Avg:97.8 F (36.6 C), Min:97.8 F (36.6 C), Max:97.8 F (36.6 C)  Recent Labs  Lab 04/28/17 2151 04/29/17 0015  WBC 10.6  --   CREATININE 1.01  --   LATICACIDVEN  --  1.8    Estimated Creatinine Clearance: 40.5 mL/min (by C-G formula based on SCr of 1.01 mg/dL).    No Known Allergies  Antimicrobials this admission: Ceftriaxone 2/23  >>    >>   Dose adjustments this admission:   Microbiology results: 2/23 UCx: pending       2/23 UA: LE(+) NO2(-)  WBC TNTC Thank you for allowing pharmacy to be a part of this patient'[name] care.  [name] 04/29/2017 2:20 AM

## 2017-04-29 NOTE — Progress Notes (Signed)
[facility] [place], [place] 04/29/17  Subjective:   Patient was seen by our practice on previous admission He presented to the emergency room from home with complaints of generalized weakness, worsening bilateral leg edema and possible UTI.  Information is provided by patient's granddaughter who is at his bedside today.  Patient has an indwelling Foley catheter.  She reports that he is getting multiple UTIs.  She also reports significant weight loss since his fall in August.  He does not have much appetite.  He lives by himself.  Patient complains of left knee pain and dizziness.  He felt like he would pass out if he stands up. Nephrology consult has been requested for evaluation of hyponatremia During his previous admission, his sodium was 130 at the time of discharge At that time, hydrochlorothiazide was stopped  This admission, he presents with a sodium of 113, potassium 2.8 and chloride of 78.  Creatinine is above his baseline at 1.01.  Baseline is 0.61 on April 07, 2017 Albumin is slightly low at 3.2   Objective:  Vital signs in last 24 hours:  Temp:  [97.6 F (36.4 C)-98 F (36.7 C)] 98 F (36.7 C) (02/23 0849) Pulse Rate:  [56-85] 81 (02/23 0849) Resp:  [16-20] 16 (02/23 0849) BP: (127-172)/(67-88) 135/75 (02/23 0849) SpO2:  [99 %-100 %] 100 % (02/23 0849) Weight:  [54.2 kg (119 lb 8 oz)-56.7 kg (125 lb)] 54.2 kg (119 lb 8 oz) (02/23 0241)  Weight change:  Filed Weights   04/28/17 2130 04/29/17 0241  Weight: 56.7 kg (125 lb) 54.2 kg (119 lb 8 oz)    Intake/Output:    Intake/Output Summary (Last 24 hours) at 04/29/2017 1153 Last data filed at 04/29/2017 0535 Gross per 24 hour  Intake 1215 ml  Output 350 ml  Net 865 ml     Physical Exam: General:  Thin, cachectic gentleman, laying in the bed  HEENT  dry oral mucous membranes  Neck  supple  Pulm/lungs  normal breathing effort, clear to auscultation  CVS/Heart  irregular rhythm  Abdomen:    Soft, nondistended, nontender  Extremities:  2-3+ pitting edema bilaterally, left knee swelling  Neurologic:  Alert, able to answer questions  Skin:  No acute rashes  Foley:  In place       Basic Metabolic Panel:  Recent Labs  Lab 04/28/17 2151 04/29/17 0613  NA 113* 114*  K 2.8* 2.8*  CL 78* 83*  CO2 23 18*  GLUCOSE 182* 132*  BUN 24* 21*  CREATININE 1.01 0.72  CALCIUM 7.8* 7.2*     CBC: Recent Labs  Lab 04/28/17 2151 04/29/17 0613  WBC 10.6 11.8*  HGB 12.4* 12.0*  HCT 34.5* 34.5*  MCV 86.6 87.5  PLT 367 308     No results found for: HEPBSAG, HEPBSAB, HEPBIGM    Microbiology:  No results found for this or any previous visit (from the past 240 hour(s)).  Coagulation Studies: No results for input(s): LABPROT, INR in the last 72 hours.  Urinalysis: Recent Labs    04/29/17 0103  COLORURINE YELLOW*  LABSPEC 1.012  PHURINE 5.0  GLUCOSEU NEGATIVE  HGBUR MODERATE*  BILIRUBINUR NEGATIVE  KETONESUR 5*  PROTEINUR NEGATIVE  NITRITE NEGATIVE  LEUKOCYTESUR LARGE*      Imaging: Koreas Venous Img Lower Bilateral  Result Date: 04/29/2017 CLINICAL DATA:  84 year old male with bilateral lower extremity edema. EXAM: BILATERAL LOWER EXTREMITY VENOUS DOPPLER ULTRASOUND TECHNIQUE: Gray-scale sonography with graded compression, as well as color Doppler and  duplex ultrasound were performed to evaluate the lower extremity deep venous systems from the level of the common femoral vein and including the common femoral, femoral, profunda femoral, popliteal and calf veins including the posterior tibial, peroneal and gastrocnemius veins when visible. The superficial great saphenous vein was also interrogated. Spectral Doppler was utilized to evaluate flow at rest and with distal augmentation maneuvers in the common femoral, femoral and popliteal veins. COMPARISON:  Ultrasound dated 12/03/2014 FINDINGS: RIGHT LOWER EXTREMITY Common Femoral Vein: No evidence of thrombus. Normal  compressibility, respiratory phasicity and response to augmentation. Saphenofemoral Junction: No evidence of thrombus. Normal compressibility and flow on color Doppler imaging. Profunda Femoral Vein: No evidence of thrombus. Normal compressibility and flow on color Doppler imaging. Femoral Vein: No evidence of thrombus. Normal compressibility, respiratory phasicity and response to augmentation. Popliteal Vein: No evidence of thrombus. Normal compressibility, respiratory phasicity and response to augmentation. Calf Veins: The posterior tibial vein is patent. The peroneal vein is poorly visualized. Superficial Great Saphenous Vein: No evidence of thrombus. Normal compressibility. Other Findings:  Diffuse subcutaneous edema. LEFT LOWER EXTREMITY Common Femoral Vein: There is nonocclusive thrombus in the common femoral vein which may be related to chronic thrombus/scarring. Saphenofemoral Junction: There is nonocclusive thrombus. Profunda Femoral Vein: No evidence of thrombus. Normal compressibility and flow on color Doppler imaging. Femoral Vein: Peripheral echogenic and nonocclusive content noted in the femoral vein, likely related to chronic thrombus/scarring. Popliteal Vein: No evidence of thrombus. Normal compressibility, respiratory phasicity and response to augmentation. Calf Veins: The posterior tibial vein appears patent. The peroneal vein is not well visualized. Superficial Great Saphenous Vein: Not seen. Other Findings: There is a 6.3 x 1.8 x 7.2 cm left posterior popliteal cystic structure, likely a Baker's cyst. There is diffuse subcutaneous edema. IMPRESSION: 1. No evidence of acute DVT in the right lower extremity. 2. Nonocclusive thrombus involving the left common femoral and superficial femoral veins may represent residual chronic thrombus/scarring. An acute nonocclusive thrombus is not entirely excluded. Clinical correlation is recommended 3. Moderate-sized left popliteal fossa cyst. 4. Diffuse  subcutaneous edema. These results were called by telephone at the time of interpretation on 04/29/2017 at 12:25 am to Dr. Dolores Frame , who verbally acknowledged these results. Electronically Signed   By: Elgie Collard M.D.   On: 04/29/2017 00:29   Dg Chest Portable 1 View  Result Date: 04/28/2017 CLINICAL DATA:  Weakness and lower extremity edema EXAM: PORTABLE CHEST 1 VIEW COMPARISON:  Chest radiograph 10/21/2016 FINDINGS: The lungs are hyperinflated with unchanged areas of interstitial prominence. No focal airspace consolidation or pulmonary edema. No pleural effusion or pneumothorax. Normal cardiomediastinal contours. IMPRESSION: Hyperinflation without acute cardiopulmonary disease. Electronically Signed   By: Deatra Robinson M.D.   On: 04/28/2017 22:59     Medications:   . sodium chloride 100 mL/hr at 04/29/17 0526  . [START ON 04/30/2017] cefTRIAXone (ROCEPHIN) IVPB 2 gram/100 mL NS (Mini-Bag Plus)     . atorvastatin  20 mg Oral Daily  . enoxaparin (LOVENOX) injection  40 mg Subcutaneous Q24H  . finasteride  5 mg Oral Daily  . insulin aspart  0-5 Units Subcutaneous QHS  . insulin aspart  0-9 Units Subcutaneous TID WC  . latanoprost  1 drop Both Eyes QHS  . [START ON 04/30/2017] sodium chloride  1 g Oral BID   acetaminophen **OR** acetaminophen, ondansetron **OR** ondansetron (ZOFRAN) IV  Assessment/ Plan:  84 y.o. male with diabetes, BPH, GERD, hyperlipidemia, hypertension presents with weakness and worsening leg edema and is  found to have severe hyponatremia.  Hospital stay is complicated by left femoral nonocclusive DVT   1.  Severe hyponatremia and hypokalemia Likely multifactorial but exacerbated by Lasix which he was taking at home Agree with discontinuing Lasix Agree with IV fluid hydration and monitoring sodium sodium closely. Baseline sodium appears to be 130 on March 20, 2017 Goal for correction by tomorrow morning is 122-124  2.  Weight loss and cachexia Consider  age-appropriate cancer screening Patient had EGD and colonoscopy in January 2015 His chest x-ray shows hyperinflation but no mass We will order PSA We will also obtain SPEP, UPEP  3.  Severe lower extremity edema Likely from third spacing of fluid Consider 2D echo-CHF is suspected  We will follow    LOS: 0 Jaena Brocato Thedore Mins 2/23/201911:53 AM  [facility] [place], [place] [phone]  Note: This note was prepared with Dragon dictation. Any transcription errors are unintentional

## 2017-04-29 NOTE — Plan of Care (Signed)
  Urinary Elimination: Signs and symptoms of infection will decrease 04/29/2017 2000 - Progressing by Donnel SaxonKennedy, Tani Virgo L, RN   Education: Knowledge of General Education information will improve 04/29/2017 2000 - Progressing by Donnel SaxonKennedy, Lorilei Horan L, RN   Health Behavior/Discharge Planning: Ability to manage health-related needs will improve 04/29/2017 2000 - Progressing by Donnel SaxonKennedy, Vadim Centola L, RN   Clinical Measurements: Ability to maintain clinical measurements within normal limits will improve 04/29/2017 2000 - Progressing by Donnel SaxonKennedy, Deloise Marchant L, RN Will remain free from infection 04/29/2017 2000 - Progressing by Donnel SaxonKennedy, Camaria Gerald L, RN Diagnostic test results will improve 04/29/2017 2000 - Progressing by Donnel SaxonKennedy, Herchel Hopkin L, RN Respiratory complications will improve 04/29/2017 2000 - Progressing by Donnel SaxonKennedy, Ngan Qualls L, RN Cardiovascular complication will be avoided 04/29/2017 2000 - Progressing by Donnel SaxonKennedy, Deandria Klute L, RN   Activity: Risk for activity intolerance will decrease 04/29/2017 2000 - Progressing by Donnel SaxonKennedy, Madine Sarr L, RN   Nutrition: Adequate nutrition will be maintained 04/29/2017 2000 - Progressing by Donnel SaxonKennedy, Burlin Mcnair L, RN   Coping: Level of anxiety will decrease 04/29/2017 2000 - Progressing by Donnel SaxonKennedy, Keiva Dina L, RN   Elimination: Will not experience complications related to bowel motility 04/29/2017 2000 - Progressing by Donnel SaxonKennedy, Meshawn Oconnor L, RN Will not experience complications related to urinary retention 04/29/2017 2000 - Progressing by Donnel SaxonKennedy, Meagen Limones L, RN   Pain Managment: General experience of comfort will improve 04/29/2017 2000 - Progressing by Donnel SaxonKennedy, Jarquez Mestre L, RN   Safety: Ability to remain free from injury will improve 04/29/2017 2000 - Progressing by Donnel SaxonKennedy, Temesgen Weightman L, RN   Skin Integrity: Risk for impaired skin integrity will decrease 04/29/2017 2000 - Progressing by Donnel SaxonKennedy, Soma Lizak L, RN

## 2017-04-29 NOTE — Progress Notes (Signed)
Patient's sodium level at 16, notified Dr. Elpidio AnisSudini. No new orders received.

## 2017-04-29 NOTE — H&P (Signed)
[facility] - [place] at [facility]   PATIENT NAME: [name]    MR#:  [id number]  DATE OF BIRTH:  [birth year]  DATE OF ADMISSION:  04/28/2017  PRIMARY CARE PHYSICIAN: Lauro Regulus, MD   REQUESTING/REFERRING PHYSICIAN: Roxan Hockey, MD  CHIEF COMPLAINT:   Chief Complaint  Patient presents with  . Weakness    HISTORY OF PRESENT ILLNESS:  [name]  is a 84 y.o. male who presents with several days of increasing weakness, lower extremity swelling, foul-smelling urine.  Patient and daughter states that he has had very poor p.o. intake.  Here in the ED he was found to have hyponatremia.  Also found to have UTI.  Hospitalist were called for admission.  PAST MEDICAL HISTORY:   Past Medical History:  Diagnosis Date  . BPH (benign prostatic hyperplasia)   . Diabetes mellitus without complication (HCC)   . GERD (gastroesophageal reflux disease)   . HLD (hyperlipidemia)   . Hypertension     PAST SURGICAL HISTORY:   Past Surgical History:  Procedure Laterality Date  . HIP FRACTURE SURGERY    . KNEE SURGERY      SOCIAL HISTORY:   Social History   Tobacco Use  . Smoking status: Never Smoker  . Smokeless tobacco: Never Used  Substance Use Topics  . Alcohol use: No    FAMILY HISTORY:   Family History  Problem Relation Age of Onset  . Diabetes Father   . Hypertension Father   . Prostate cancer Father     DRUG ALLERGIES:  No Known Allergies  MEDICATIONS AT HOME:   Prior to Admission medications   Medication Sig Start Date End Date Taking? Authorizing Provider  atorvastatin (LIPITOR) 20 MG tablet Take 20 mg by mouth daily.   Yes [provider]  finasteride (PROSCAR) 5 MG tablet Take 1 tablet (5 mg total) by mouth daily. 02/15/17  Yes McGowan, Carollee Herter A, PA-C  furosemide (LASIX) 20 MG tablet Take 20 mg by mouth daily.   Yes [provider]  latanoprost (XALATAN) 0.005 % ophthalmic solution Place 1 drop  into both eyes at bedtime.   Yes [provider]  potassium chloride (K-DUR,KLOR-CON) 10 MEQ tablet Take 10 mEq by mouth daily.   Yes [provider]  sodium chloride 1 g tablet Take 1 tablet (1 g total) by mouth 2 (two) times daily with a meal. 04/07/17  Yes Enid Baas, MD  feeding supplement, ENSURE ENLIVE, (ENSURE ENLIVE) LIQD Take 237 mLs by mouth 2 (two) times daily between meals. Patient not taking: Reported on 04/28/2017 04/07/17   Enid Baas, MD  lisinopril (PRINIVIL,ZESTRIL) 10 MG tablet Take 1 tablet (10 mg total) by mouth daily. 03/20/17 04/19/17  Milagros Loll, MD  tamsulosin (FLOMAX) 0.4 MG CAPS capsule Take 1 capsule (0.4 mg total) by mouth daily. Patient not taking: Reported on 04/28/2017 02/15/17   Michiel Cowboy A, PA-C    REVIEW OF SYSTEMS:  Review of Systems  Constitutional: Positive for malaise/fatigue. Negative for chills, fever and weight loss.  HENT: Negative for ear pain, hearing loss and tinnitus.   Eyes: Negative for blurred vision, double vision, pain and redness.  Respiratory: Negative for cough, hemoptysis and shortness of breath.   Cardiovascular: Positive for leg swelling. Negative for chest pain, palpitations and orthopnea.  Gastrointestinal: Negative for abdominal pain, constipation, diarrhea, nausea and vomiting.  Genitourinary: Negative for dysuria, frequency and hematuria.  Musculoskeletal: Negative for back pain, joint pain and neck pain.  Skin:  No acne, rash, or lesions  Neurological: Positive for weakness. Negative for dizziness, tremors and focal weakness.  Endo/Heme/Allergies: Negative for polydipsia. Does not bruise/bleed easily.  Psychiatric/Behavioral: Negative for depression. The patient is not nervous/anxious and does not have insomnia.      VITAL SIGNS:   Vitals:   04/28/17 2218 04/28/17 2219 04/28/17 2230 04/29/17 0008  BP: 127/83  (!) 144/77 132/73  Pulse:  85 85 77  Resp:  20 18 18   Temp:       TempSrc:      SpO2:  100% 100% 99%  Weight:      Height:       Wt Readings from Last 3 Encounters:  04/28/17 56.7 kg (125 lb)  04/19/17 56.7 kg (125 lb)  04/06/17 52.6 kg (116 lb)    PHYSICAL EXAMINATION:  Physical Exam  Vitals reviewed. Constitutional: He is oriented to person, place, and time. He appears well-developed and well-nourished. No distress.  HENT:  Head: Normocephalic and atraumatic.  Dry mucous membranes  Eyes: Conjunctivae and EOM are normal. Pupils are equal, round, and reactive to light. No scleral icterus.  Neck: Normal range of motion. Neck supple. No JVD present. No thyromegaly present.  Cardiovascular: Normal rate, regular rhythm and intact distal pulses. Exam reveals no gallop and no friction rub.  No murmur heard. Respiratory: Effort normal and breath sounds normal. No respiratory distress. He has no wheezes. He has no rales.  GI: Soft. Bowel sounds are normal. He exhibits no distension. There is no tenderness.  Musculoskeletal: Normal range of motion. He exhibits edema.  No arthritis, no gout  Lymphadenopathy:    He has no cervical adenopathy.  Neurological: He is alert and oriented to person, place, and time. No cranial nerve deficit.  No dysarthria, no aphasia  Skin: Skin is warm and dry. No rash noted. No erythema.  Psychiatric: He has a normal mood and affect. His behavior is normal. Judgment and thought content normal.    LABORATORY PANEL:   CBC Recent Labs  Lab 04/28/17 2151  WBC 10.6  HGB 12.4*  HCT 34.5*  PLT 367   ------------------------------------------------------------------------------------------------------------------  Chemistries  Recent Labs  Lab 04/28/17 2151  NA 113*  K 2.8*  CL 78*  CO2 23  GLUCOSE 182*  BUN 24*  CREATININE 1.01  CALCIUM 7.8*  AST 61*  ALT 37  ALKPHOS 65  BILITOT 1.8*    ------------------------------------------------------------------------------------------------------------------  Cardiac Enzymes No results for input(s): TROPONINI in the last 168 hours. ------------------------------------------------------------------------------------------------------------------  RADIOLOGY:  [place] Venous Img Lower Bilateral  Result Date: 04/29/2017 CLINICAL DATA:  84 year old male with bilateral lower extremity edema. EXAM: BILATERAL LOWER EXTREMITY VENOUS DOPPLER ULTRASOUND TECHNIQUE: Gray-scale sonography with graded compression, as well as color Doppler and duplex ultrasound were performed to evaluate the lower extremity deep venous systems from the level of the common femoral vein and including the common femoral, femoral, profunda femoral, popliteal and calf veins including the posterior tibial, peroneal and gastrocnemius veins when visible. The superficial great saphenous vein was also interrogated. Spectral Doppler was utilized to evaluate flow at rest and with distal augmentation maneuvers in the common femoral, femoral and popliteal veins. COMPARISON:  Ultrasound dated 12/03/2014 FINDINGS: RIGHT LOWER EXTREMITY Common Femoral Vein: No evidence of thrombus. Normal compressibility, respiratory phasicity and response to augmentation. Saphenofemoral Junction: No evidence of thrombus. Normal compressibility and flow on color Doppler imaging. Profunda Femoral Vein: No evidence of thrombus. Normal compressibility and flow on color Doppler imaging. Femoral Vein: No evidence of  thrombus. Normal compressibility, respiratory phasicity and response to augmentation. Popliteal Vein: No evidence of thrombus. Normal compressibility, respiratory phasicity and response to augmentation. Calf Veins: The posterior tibial vein is patent. The peroneal vein is poorly visualized. Superficial Great Saphenous Vein: No evidence of thrombus. Normal compressibility. Other Findings:  Diffuse  subcutaneous edema. LEFT LOWER EXTREMITY Common Femoral Vein: There is nonocclusive thrombus in the common femoral vein which may be related to chronic thrombus/scarring. Saphenofemoral Junction: There is nonocclusive thrombus. Profunda Femoral Vein: No evidence of thrombus. Normal compressibility and flow on color Doppler imaging. Femoral Vein: Peripheral echogenic and nonocclusive content noted in the femoral vein, likely related to chronic thrombus/scarring. Popliteal Vein: No evidence of thrombus. Normal compressibility, respiratory phasicity and response to augmentation. Calf Veins: The posterior tibial vein appears patent. The peroneal vein is not well visualized. Superficial Great Saphenous Vein: Not seen. Other Findings: There is a 6.3 x 1.8 x 7.2 cm left posterior popliteal cystic structure, likely a Baker's cyst. There is diffuse subcutaneous edema. IMPRESSION: 1. No evidence of acute DVT in the right lower extremity. 2. Nonocclusive thrombus involving the left common femoral and superficial femoral veins may represent residual chronic thrombus/scarring. An acute nonocclusive thrombus is not entirely excluded. Clinical correlation is recommended 3. Moderate-sized left popliteal fossa cyst. 4. Diffuse subcutaneous edema. These results were called by telephone at the time of interpretation on 04/29/2017 at 12:25 am to Dr. Dolores Frame , who verbally acknowledged these results. Electronically Signed   By: Elgie Collard M.D.   On: 04/29/2017 00:29   Dg Chest Portable 1 View  Result Date: 04/28/2017 CLINICAL DATA:  Weakness and lower extremity edema EXAM: PORTABLE CHEST 1 VIEW COMPARISON:  Chest radiograph 10/21/2016 FINDINGS: The lungs are hyperinflated with unchanged areas of interstitial prominence. No focal airspace consolidation or pulmonary edema. No pleural effusion or pneumothorax. Normal cardiomediastinal contours. IMPRESSION: Hyperinflation without acute cardiopulmonary disease. Electronically Signed    By: Deatra Robinson M.D.   On: 04/28/2017 22:59    EKG:   Orders placed or performed during the hospital encounter of 04/28/17  . EKG 12-Lead  . EKG 12-Lead  . ED EKG  . ED EKG    IMPRESSION AND PLAN:  Principal Problem:   Hyponatremia -due to a combination of poor p.o. intake, recent initiation of diuretic use, and UTI.  We will hydrate him with fluids tonight, hold his diuretics, and treat his UTI as below Active Problems:   UTI (urinary tract infection) -IV antibiotics, urine culture sent   BPH with obstruction/lower urinary tract symptoms -continue home meds   Diabetes (HCC) -sliding scale insulin with corresponding glucose checks   HTN (hypertension) -patient is currently normotensive, hold home antihypertensives until his blood pressure rises further   HLD (hyperlipidemia) -home dose antilipid  All the records are reviewed and case discussed with ED provider. Management plans discussed with the patient and/or family.  DVT PROPHYLAXIS: SubQ lovenox  GI PROPHYLAXIS: None  ADMISSION STATUS: Inpatient  CODE STATUS: DNR Code Status History    Date Active Date Inactive Code Status Order ID Comments User Context   04/06/2017 15:20 04/07/2017 21:57 DNR [id number]  Shaune Pollack, MD Inpatient   03/17/2017 22:50 03/20/2017 21:12 Full Code [id number]  Oralia Manis, MD Inpatient    Questions for Most Recent Historical Code Status (Order [id number])    Question Answer Comment   In the event of cardiac or respiratory ARREST Do not call a "code blue"    In the event of cardiac or respiratory  ARREST Do not perform Intubation, CPR, defibrillation or ACLS    In the event of cardiac or respiratory ARREST Use medication by any route, position, wound care, and other measures to relive pain and suffering. May use oxygen, suction and manual treatment of airway obstruction as needed for comfort.         Advance Directive Documentation     Most Recent Value  Type of Advance Directive  Healthcare  Power of Attorney  Pre-existing out of facility DNR order (yellow form or pink MOST form)  No data  "MOST" Form in Place?  No data      TOTAL TIME TAKING CARE OF THIS PATIENT: 45 minutes.   Sharmain Lastra FIELDING 04/29/2017, 1:07 AM  [employer]  [phone]  CC: Primary care physician; Lauro Regulus, MD  Note:  This document was prepared using Dragon voice recognition software and may include unintentional dictation errors.

## 2017-04-29 NOTE — Evaluation (Signed)
Clinical/Bedside Swallow Evaluation Patient Details  Name: [name] MRN: [id number] Date of Birth: Sep 13, 1926  Today's Date: 04/29/2017 Time: SLP Start Time (ACUTE ONLY): 1430 SLP Stop Time (ACUTE ONLY): 1530 SLP Time Calculation (min) (ACUTE ONLY): 60 min  Past Medical History:  Past Medical History:  Diagnosis Date  . BPH (benign prostatic hyperplasia)   . Diabetes mellitus without complication (HCC)   . GERD (gastroesophageal reflux disease)   . HLD (hyperlipidemia)   . Hypertension    Past Surgical History:  Past Surgical History:  Procedure Laterality Date  . HIP FRACTURE SURGERY    . KNEE SURGERY     HPI:  Pt presented to the emergency room from home with complaints of generalized weakness, worsening bilateral leg edema and possible UTI.  Information is provided by patient's granddaughter who is at his bedside today.  Patient has an indwelling Foley catheter.  She reports that he is getting multiple UTIs.  She also reports significant weight loss since his fall in August.  He does not have much appetite - pt's weight per MD Office visit on 03/16/2017 was: Wt 116 lb (52.6 kg).  He lives by himself.  Patient complains of left knee pain and dizziness.  He felt like he would pass out if he stands up.  Currently, he presents with a sodium of 113-114, potassium 2.8 and chloride of 78-83.  Pt is verbally responsive but requires time for follow through and completion of thoughts - suspect could be related to labs.  Unusre if pt's baseline Cognitive status prior to admission.  Pt wears denture/partial which were removed d/t needing to be cleaned(severely dirty). Pt reports ill-fit.    Assessment / Plan / Recommendation Clinical Impression  Pt appears to present w/ min+ oropharyngeal phase dysphagia suspect mostly impacted by his declined Medical and Cognitive status'(Sodium level 113-114); poor dentition status requiring dentures to be removed/cleaned. Pt is at increased risk for  aspiration at this time. Pt was positioned upright in bed and provided trials of Nectar consistency liquids, Ensure(not thickened), and purees. Pt consumed these consistencies w/ no overt s/s of aspiration noted; no decline in respiratory status or vocal quality b/t trials. Pt exhibited adequate oral phase management, transfer, and oral clearing of bolus material w/ consistencies assessed. No other trial consistencies tested at this eval d/t declined medical/cognitive status' and risk for dysphagia and aspiration. ST services will continue to follow for trials to upgrade when appropriate. Pt assisted in feeding self by holding the cup/bottle to drink w/ straw. Of note, increased belching noted during drinking - instructed pt to take his time to release the extra air to avoid dysmotility and discomfort. Recommend a Dysphagia level 1 w/ Nectar liquids at this time; aspiration precautions; Pills in Puree - Crushed as able or in liquid form mixed in puree. General Reflux precautions. NSG updated.  Of note, when encouraged to take bites of food or order dinner, pt declined stating he "just could not eat". Pt reported same at home for "some time"; noted weight loss status. SLP Visit Diagnosis: Dysphagia, oropharyngeal phase (R13.12)    Aspiration Risk  Mild aspiration risk    Diet Recommendation  Dysphagia level 1 (puree) w/ Nectar liquids; aspiration precautions; Reflux precautions. May have Ensure as is w/out thickening per Speech.  Medication Administration: Crushed with puree(as able or in liquid form mixed in puree)    Other  Recommendations Recommended Consults: (Dietician f/u; Palliative Care f/u) Oral Care Recommendations: Oral care BID;Staff/trained caregiver to provide  oral care(clean dentures) Other Recommendations: Order thickener from pharmacy;Prohibited food (jello, ice cream, thin soups);Have oral suction available;Remove water pitcher   Follow up Recommendations (TBD)      Frequency and  Duration min 2x/week  2 weeks       Prognosis Prognosis for Safe Diet Advancement: Fair Barriers to Reach Goals: Cognitive deficits;Severity of deficits(declined medical status)      Swallow Study   General Date of Onset: 04/28/17 HPI: Pt presented to the emergency room from home with complaints of generalized weakness, worsening bilateral leg edema and possible UTI.  Information is provided by patient's granddaughter who is at his bedside today.  Patient has an indwelling Foley catheter.  She reports that he is getting multiple UTIs.  She also reports significant weight loss since his fall in August.  He does not have much appetite - pt's weight per MD Office visit on 03/16/2017 was: Wt 116 lb (52.6 kg).  He lives by himself.  Patient complains of left knee pain and dizziness.  He felt like he would pass out if he stands up.  Currently, he presents with a sodium of 113-114, potassium 2.8 and chloride of 78-83.  Pt is verbally responsive but requires time for follow through and completion of thoughts - suspect could be related to labs.  Unusre if pt's baseline Cognitive status prior to admission.  Pt wears denture/partial which were removed d/t needing to be cleaned(severely dirty). Pt reports ill-fit.  Type of Study: Bedside Swallow Evaluation Previous Swallow Assessment: none reported Diet Prior to this Study: NPO Temperature Spikes Noted: No(wbc 11.8) Respiratory Status: Room air History of Recent Intubation: No Behavior/Cognition: Alert;Cooperative;Pleasant mood;Confused;Distractible;Requires cueing(appeared weak) Oral Cavity Assessment: Dried secretions;Dry Oral Care Completed by SLP: Yes Oral Cavity - Dentition: Poor condition;Dentures, top(partial on bottom) Vision: Functional for self-feeding Self-Feeding Abilities: Able to feed self;Needs assist;Needs set up(weak) Patient Positioning: Upright in bed Baseline Vocal Quality: Normal;Low vocal intensity Volitional Cough: Cognitively  unable to elicit Volitional Swallow: Unable to elicit    Oral/Motor/Sensory Function Overall Oral Motor/Sensory Function: Within functional limits(grossly wfl)   Ice Chips Ice chips: Not tested   Thin Liquid Thin Liquid: Not tested    Nectar Thick Nectar Thick Liquid: Within functional limits Presentation: Cup;Self Fed;Straw(~4 ozs) Other Comments: also drank Ensure (not thickened) w/ no deficits   Honey Thick Honey Thick Liquid: Not tested   Puree Puree: Within functional limits Presentation: Spoon(fed; 6 trials) Other Comments: pt declined more   Solid   GO   Solid: Not tested Other Comments: removed dirty dentures         Jerilynn SomKatherine Jeannemarie Sawaya, MS, CCC-SLP Kiyan Burmester 04/29/2017,4:26 PM

## 2017-04-29 NOTE — Progress Notes (Addendum)
CRITICAL VALUE ALERT  Critical Value:  Sodium 116  Date & Time Notied:  04/29/2017 1836  Provider Notified: Cherlynn KaiserSainani   Orders Received/Actions taken: acknowledged; no new orders received

## 2017-04-30 ENCOUNTER — Encounter: Payer: Self-pay | Admitting: Internal Medicine

## 2017-04-30 LAB — SODIUM
SODIUM: 116 mmol/L — AB (ref 135–145)
SODIUM: 120 mmol/L — AB (ref 135–145)
Sodium: 121 mmol/L — ABNORMAL LOW (ref 135–145)

## 2017-04-30 LAB — BASIC METABOLIC PANEL
Anion gap: 8 (ref 5–15)
BUN: 22 mg/dL — ABNORMAL HIGH (ref 6–20)
CALCIUM: 7.4 mg/dL — AB (ref 8.9–10.3)
CO2: 21 mmol/L — ABNORMAL LOW (ref 22–32)
Chloride: 88 mmol/L — ABNORMAL LOW (ref 101–111)
Creatinine, Ser: 0.8 mg/dL (ref 0.61–1.24)
GLUCOSE: 144 mg/dL — AB (ref 65–99)
POTASSIUM: 3.2 mmol/L — AB (ref 3.5–5.1)
Sodium: 117 mmol/L — CL (ref 135–145)

## 2017-04-30 LAB — GLUCOSE, CAPILLARY
Glucose-Capillary: 139 mg/dL — ABNORMAL HIGH (ref 65–99)
Glucose-Capillary: 160 mg/dL — ABNORMAL HIGH (ref 65–99)
Glucose-Capillary: 156 mg/dL — ABNORMAL HIGH (ref 65–99)
Glucose-Capillary: 155 mg/dL — ABNORMAL HIGH (ref 65–99)
Glucose-Capillary: 143 mg/dL — ABNORMAL HIGH (ref 65–99)

## 2017-04-30 LAB — ECHOCARDIOGRAM COMPLETE
Height: 70 in
WEIGHTICAEL: 1912 [oz_av]

## 2017-04-30 MED ORDER — DOCUSATE SODIUM 100 MG PO CAPS
100.0000 mg | ORAL_CAPSULE | Freq: Two times a day (BID) | ORAL | Status: DC
  Administered 2017-04-30 – 2017-05-03 (×4): 100 mg via ORAL
  Filled 2017-04-30 (×5): qty 1

## 2017-04-30 MED ORDER — ENSURE ENLIVE PO LIQD
237.0000 mL | Freq: Two times a day (BID) | ORAL | Status: DC
  Administered 2017-05-02 – 2017-05-03 (×2): 237 mL via ORAL

## 2017-04-30 MED ORDER — BISACODYL 5 MG PO TBEC
5.0000 mg | DELAYED_RELEASE_TABLET | Freq: Every day | ORAL | Status: DC
  Administered 2017-04-30: 5 mg via ORAL
  Filled 2017-04-30 (×3): qty 1

## 2017-04-30 MED ORDER — MAGNESIUM SULFATE 4 GM/100ML IV SOLN
4.0000 g | Freq: Once | INTRAVENOUS | Status: AC
  Administered 2017-04-30: 4 g via INTRAVENOUS
  Filled 2017-04-30: qty 100

## 2017-04-30 MED ORDER — PIPERACILLIN-TAZOBACTAM 3.375 G IVPB
3.3750 g | Freq: Three times a day (TID) | INTRAVENOUS | Status: DC
  Administered 2017-04-30 – 2017-05-02 (×6): 3.375 g via INTRAVENOUS
  Filled 2017-04-30 (×7): qty 50

## 2017-04-30 MED ORDER — POLYETHYLENE GLYCOL 3350 17 G PO PACK
17.0000 g | PACK | Freq: Two times a day (BID) | ORAL | Status: DC
  Administered 2017-04-30: 20:00:00 17 g via ORAL
  Filled 2017-04-30 (×2): qty 1

## 2017-04-30 NOTE — Consult Note (Signed)
Pharmacy Antibiotic Note  Christian LarocheClarence J [name] is a 84 y.o. male admitted on 04/28/2017 with UTI.  Pharmacy has been consulted for zosyn dosing.  Plan: Zosyn 3.375g IV q8h (4 hour infusion).  Height: 5\' 10"  (177.8 cm) Weight: 119 lb 8 oz (54.2 kg) IBW/kg (Calculated) : 73  Temp (24hrs), Avg:98 F (36.7 C), Min:97.5 F (36.4 C), Max:98.5 F (36.9 C)  Recent Labs  Lab 04/28/17 2151 04/29/17 0015 04/29/17 0613 04/30/17 0721  WBC 10.6  --  11.8*  --   CREATININE 1.01  --  0.72 0.80  LATICACIDVEN  --  1.8  --   --     Estimated Creatinine Clearance: 48.9 mL/min (by C-G formula based on SCr of 0.8 mg/dL).    No Known Allergies  Antimicrobials this admission: ceftriaxone 2/23 >> 2/24 zosyn 2/24 >>   Dose adjustments this admission:   Microbiology results: Recent Results (from the past 720 hour(s))  Urine Culture     Status: Abnormal   Collection Time: 04/06/17 12:25 PM  Result Value Ref Range Status   Specimen Description   Final    URINE, RANDOM Performed at [facility], [address] [place], [place] [address]    Special Requests   Final    NONE Performed at [facility], [address] [place], [place] [address]    Culture (A)  Final    >=100,000 COLONIES/mL STENOTROPHOMONAS MALTOPHILIA 80,000 COLONIES/mL YEAST    Report Status 04/09/2017 FINAL  Final   Organism ID, Bacteria STENOTROPHOMONAS MALTOPHILIA (A)  Final      Susceptibility   Stenotrophomonas maltophilia - MIC*    LEVOFLOXACIN 4 INTERMEDIATE Intermediate     TRIMETH/SULFA 160 RESISTANT Resistant     * >=100,000 COLONIES/mL STENOTROPHOMONAS MALTOPHILIA  Urine Culture     Status: Abnormal (Preliminary result)   Collection Time: 04/29/17  1:03 AM  Result Value Ref Range Status   Specimen Description   Final    URINE, RANDOM Performed at [facility], [address] [place], [place] [address]    Special Requests   Final    NONE Performed at [facility],  [address] [place], [place] [address]    Culture (A)  Final    >=100,000 COLONIES/mL STENOTROPHOMONAS MALTOPHILIA >=100,000 COLONIES/mL ENTEROCOCCUS FAECALIS SUSCEPTIBILITIES TO FOLLOW Performed at [facility], [address] [place], [place] [address]    Report Status PENDING  Incomplete     Thank you for allowing pharmacy to be a part of this patient's care.  Olene FlossMelissa D Azzan Butler, Pharm.D, BCPS Clinical Pharmacist 04/30/2017 2:23 PM

## 2017-04-30 NOTE — Progress Notes (Signed)
[facility], [place] 04/30/17  Subjective:   Patient was seen by our practice on previous admission He presented to the emergency room from home with complaints of generalized weakness, worsening bilateral leg edema and possible UTI.  Information is provided by patient's granddaughter who is at his bedside.  Patient has an indwelling Foley catheter.  She reports that he is getting multiple UTIs.  She also reports significant weight loss since his fall in August.  He does not have much appetite.  He lives by himself.  Patient complains of left knee pain and dizziness.  He felt like he would pass out if he stands up. Nephrology consult has been requested for evaluation of hyponatremia During his previous admission, his sodium was 130 at the time of discharge At that time, hydrochlorothiazide was stopped  This admission, he presents with a sodium of 113, potassium 2.8 and chloride of 78.  Creatinine is above his baseline at 1.01.  Baseline is 0.61 on April 07, 2017 Albumin is slightly low at 3.2  Patient is currently being treated with IV normal saline.  His sodium level is 117 this morning.  Potassium is slightly improved to 3.2.  Patient has a good amount of urine in his Foley bag   Objective:  Vital signs in last 24 hours:  Temp:  [97.5 F (36.4 C)-98.5 F (36.9 C)] 98.5 F (36.9 C) (02/24 0818) Pulse Rate:  [81-90] 81 (02/24 0818) Resp:  [16-21] 16 (02/24 0818) BP: (114-122)/(59-76) 115/76 (02/24 0818) SpO2:  [97 %-99 %] 97 % (02/24 0818)  Weight change:  Filed Weights   04/28/17 2130 04/29/17 0241  Weight: 56.7 kg (125 lb) 54.2 kg (119 lb 8 oz)    Intake/Output:    Intake/Output Summary (Last 24 hours) at 04/30/2017 1244 Last data filed at 04/30/2017 0518 Gross per 24 hour  Intake 764.17 ml  Output -  Net 764.17 ml     Physical Exam: General:  Thin, cachectic gentleman, laying in the bed  HEENT  dry oral mucous membranes  Neck  supple   Pulm/lungs  normal breathing effort, clear to auscultation  CVS/Heart  irregular rhythm  Abdomen:   Soft, nondistended, nontender  Extremities:  2-3+ pitting edema bilaterally, left knee swelling  Neurologic:  Alert, able to answer questions  Skin:  No acute rashes  Foley:  In place       Basic Metabolic Panel:  Recent Labs  Lab 04/28/17 2151 04/29/17 0613 04/29/17 1209 04/29/17 1600 04/29/17 1802 04/30/17 0027 04/30/17 0721  NA 113* 114*  --  117* 116* 116* 117*  K 2.8* 2.8*  --   --   --   --  3.2*  CL 78* 83*  --   --   --   --  88*  CO2 23 18*  --   --   --   --  21*  GLUCOSE 182* 132*  --   --   --   --  144*  BUN 24* 21*  --   --   --   --  22*  CREATININE 1.01 0.72  --   --   --   --  0.80  CALCIUM 7.8* 7.2*  --   --   --   --  7.4*  MG  --   --  1.0*  --   --   --   --      CBC: Recent Labs  Lab 04/28/17 2151 04/29/17 0613  WBC  10.6 11.8*  HGB 12.4* 12.0*  HCT 34.5* 34.5*  MCV 86.6 87.5  PLT 367 308     No results found for: HEPBSAG, HEPBSAB, HEPBIGM    Microbiology:  Recent Results (from the past 240 hour(s))  Urine Culture     Status: Abnormal (Preliminary result)   Collection Time: 04/29/17  1:03 AM  Result Value Ref Range Status   Specimen Description   Final    URINE, RANDOM Performed at [facility], [address], [place], [place] [address]    Special Requests   Final    NONE Performed at [facility], [address] [place], [place] [address]    Culture (A)  Final    >=100,000 COLONIES/mL STENOTROPHOMONAS MALTOPHILIA >=100,000 COLONIES/mL ENTEROCOCCUS FAECALIS SUSCEPTIBILITIES TO FOLLOW Performed at [facility], [address], [place], [place] [address]    Report Status PENDING  Incomplete    Coagulation Studies: No results for input(s): LABPROT, INR in the last 72 hours.  Urinalysis: Recent Labs    04/29/17 0103  COLORURINE YELLOW*  LABSPEC 1.012  PHURINE 5.0  GLUCOSEU NEGATIVE  HGBUR  MODERATE*  BILIRUBINUR NEGATIVE  KETONESUR 5*  PROTEINUR NEGATIVE  NITRITE NEGATIVE  LEUKOCYTESUR LARGE*      Imaging: Ct Chest W Contrast  Result Date: 04/29/2017 CLINICAL DATA:  84 y.o. male who presents with several days of increasing weakness, lower extremity swelling, foul-smelling urine. Patient and daughter states that he has had very poor p.o. intake. Here in the ED he was found to have hyponatremia. Also found to have UTI. Hospitalist were called for admission." EXAM: CT CHEST, ABDOMEN, AND PELVIS WITH CONTRAST TECHNIQUE: Multidetector CT imaging of the chest, abdomen and pelvis was performed following the standard protocol during bolus administration of intravenous contrast. CONTRAST:  75mL ISOVUE-300 IOPAMIDOL (ISOVUE-300) INJECTION 61% COMPARISON:  None. FINDINGS: CT CHEST FINDINGS Cardiovascular: Heart is normal in size. There are dense three-vessel coronary artery calcifications. Great vessels normal in caliber. Atherosclerotic disease is noted along the aortic arch and descending thoracic aorta. Mediastinum/Nodes: No neck base or axillary masses or enlarged lymph nodes. Normal thyroid gland. No mediastinal or hilar masses or pathologically enlarged lymph nodes. Trachea is widely patent. Mild distention of the mid to upper thoracic esophagus. Esophagus otherwise unremarkable. Lungs/Pleura: Trace pleural effusions. Mild dependent lower lobe subsegmental atelectasis. Lungs otherwise clear with no evidence of pneumonia or pulmonary edema. No lung mass or suspicious nodule. No pneumothorax. CT ABDOMEN PELVIS FINDINGS Hepatobiliary: No focal liver abnormality is seen. No gallstones, gallbladder wall thickening, or biliary dilatation. Pancreas: Unremarkable. No pancreatic ductal dilatation or surrounding inflammatory changes. Spleen: Normal in size without focal abnormality. Adrenals/Urinary Tract: No adrenal masses. 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 No other renal masses or lesions. No stones. No hydronephrosis. There is bilateral renal cortical thinning. There is symmetric renal enhancement and excretion. Ureters are not well visualized due to ascites, but they appear to be normal in caliber and course. Bladder is decompressed with a Foley catheter. Stomach/Bowel: Stomach shows fairly bright enhancement of the mucosa with prominent mucosal folds of the proximal to mid stomach. No stomach mass. No convincing wall thickening. There is somewhat prominent enhancement of the mucosa the proximal to mid small bowel. There is mild distention of small bowel without a transition point. No wall thickening. There is pneumatosis intestinalis along the right colon, with no wall thickening. Colon is mildly distended. There are scattered colonic air-fluid  levels. No evidence of colonic obstruction. Air mildly to moderately distends the rectum. A normal appendix is visualized. Vascular/Lymphatic: The aorta is normal caliber. There is dense atherosclerosis with associated calcifications along the entire abdominal aorta. Calcified atherosclerotic plaque is noted at the origin of the celiac axis and superior mesenteric arteries without significant stenosis. There is atherosclerotic plaque at the origin of the renal arteries with evidence at least moderate stenosis on the right and mild to moderate stenosis on the left. The inferior mesenteric artery is patent. Dense atherosclerotic calcifications are noted extending into the iliac vessels. No discrete enlarged lymph nodes. Reproductive: No prosthetic enlargement or acute or significant abnormality. Other: Moderate amount ascites. No free intraperitoneal air. No abdominal wall hernia. MUSCULOSKELETAL FINDINGS There is marked flattening/resorption of the right femoral head with associated sclerosis and cystic change. 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 These findings may reflect advanced inflammatory arthropathy such as rheumatoid arthritis, chronic avascular necrosis with secondary degenerative change/arthropathy, or a combination. There is constructed trick hip joint space narrowing on the left. No acute fracture. No osteoblastic or osteolytic lesions. IMPRESSION: CHEST CT 1. No acute findings in the chest. No evidence of pneumonia or pulmonary edema. 2. Trace pleural effusions. 3. Dense coronary artery calcifications. 4. Thoracic aortic atherosclerosis. ABDOMEN AND PELVIS CT 1. Moderate ascites. 2. Pneumatosis intestinalis involving the right colon. This may be benign in etiology. There is also increased enhancement of the wall the stomach and proximal small bowel, but no small bowel pneumatosis intestinalis. This enhancement is nonspecific. There is no bowel wall thickening. 3. There is significant aortic atherosclerotic disease. The superior mesenteric artery, celiac axis and inferior mesenteric arteries are relatively widely patent, which argues against mesenteric ischemia. 4. Narrowing of the main renal arteries due to atherosclerotic disease. 5. 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 Electronically Signed   By: Amie Portlandavid  Ormond M.D.   On: 04/29/2017 18:33   Ct Abdomen Pelvis W Contrast  Result Date: 04/29/2017 CLINICAL DATA:  84 y.o. male who presents with several days of increasing weakness, lower extremity swelling, foul-smelling urine. Patient and daughter states that he has had very poor p.o. intake. Here in the ED he was found to have hyponatremia. Also found to have UTI. Hospitalist were called for admission." EXAM: CT CHEST, ABDOMEN, AND PELVIS WITH CONTRAST TECHNIQUE: Multidetector CT imaging of the chest, abdomen and pelvis was performed following the standard protocol during bolus administration of intravenous contrast. CONTRAST:   75mL ISOVUE-300 IOPAMIDOL (ISOVUE-300) INJECTION 61% COMPARISON:  None. FINDINGS: CT CHEST FINDINGS Cardiovascular: Heart is normal in size. There are dense three-vessel coronary artery calcifications. Great vessels normal in caliber. Atherosclerotic disease is noted along the aortic arch and descending thoracic aorta. Mediastinum/Nodes: No neck base or axillary masses or enlarged lymph nodes. Normal thyroid gland. No mediastinal or hilar masses or pathologically enlarged lymph nodes. Trachea is widely patent. Mild distention of the mid to upper thoracic esophagus. Esophagus otherwise unremarkable. Lungs/Pleura: Trace pleural effusions. Mild dependent lower lobe subsegmental atelectasis. Lungs otherwise clear with no evidence of pneumonia or pulmonary edema. No lung mass or suspicious nodule. No pneumothorax. CT ABDOMEN PELVIS FINDINGS Hepatobiliary: No focal liver abnormality is seen. No gallstones, gallbladder wall thickening, or biliary dilatation. Pancreas: Unremarkable. No pancreatic ductal dilatation or surrounding inflammatory changes. Spleen: Normal in size without focal abnormality. Adrenals/Urinary Tract: No adrenal masses. 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 No other renal masses or lesions. No stones. No hydronephrosis. There is bilateral renal cortical thinning. There is symmetric renal enhancement and excretion. Ureters are not well visualized due to ascites, but they appear to be normal in caliber and course. Bladder is decompressed with a Foley catheter. Stomach/Bowel: Stomach shows fairly bright enhancement of the mucosa with prominent mucosal folds of the proximal to mid stomach. No stomach mass. No convincing wall thickening. There is somewhat prominent enhancement of the mucosa the proximal to mid small bowel. There is mild distention of small bowel without a transition point. No wall thickening. There is pneumatosis intestinalis along the right  colon, with no wall thickening. Colon is mildly distended. There are scattered colonic air-fluid levels. No evidence of colonic obstruction. Air mildly to moderately distends the rectum. A normal appendix is visualized. Vascular/Lymphatic: The aorta is normal caliber. There is dense atherosclerosis with associated calcifications along the entire abdominal aorta. Calcified atherosclerotic plaque is noted at the origin of the celiac axis and superior mesenteric arteries without significant stenosis. There is atherosclerotic plaque at the origin of the renal arteries with evidence at least moderate stenosis on the right and mild to moderate stenosis on the left. The inferior mesenteric artery is patent. Dense atherosclerotic calcifications are noted extending into the iliac vessels. No discrete enlarged lymph nodes. Reproductive: No prosthetic enlargement or acute or significant abnormality. Other: Moderate amount ascites. No free intraperitoneal air. No abdominal wall hernia. MUSCULOSKELETAL FINDINGS There is marked flattening/resorption of the right femoral head with associated sclerosis and cystic change. 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 These findings may reflect advanced inflammatory arthropathy such as rheumatoid arthritis, chronic avascular necrosis with secondary degenerative change/arthropathy, or a combination. There is constructed trick hip joint space narrowing on the left. No acute fracture. No osteoblastic or osteolytic lesions. IMPRESSION: CHEST CT 1. No acute findings in the chest. No evidence of pneumonia or pulmonary edema. 2. Trace pleural effusions. 3. Dense coronary artery calcifications. 4. Thoracic aortic atherosclerosis. ABDOMEN AND PELVIS CT 1. Moderate ascites. 2. Pneumatosis intestinalis involving the right colon. This may be benign in etiology. There is also increased enhancement of the wall  the stomach and proximal small bowel, but no small bowel pneumatosis intestinalis. This enhancement is nonspecific. There is no bowel wall thickening. 3. There is significant aortic atherosclerotic disease. The superior mesenteric artery, celiac axis and inferior mesenteric arteries are relatively widely patent, which argues against mesenteric ischemia. 4. Narrowing of the main renal arteries due to atherosclerotic disease. 5. 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 Electronically Signed   By: Amie Portland M.D.   On: 04/29/2017 18:33   US Venous Img Lower Bilateral  Result Date: 04/29/2017 CLINICAL DATA:  84 year old male with bilateral lower extremity edema. EXAM: BILATERAL LOWER EXTREMITY VENOUS DOPPLER ULTRASOUND TECHNIQUE: Gray-scale sonography with graded compression, as well as color Doppler and duplex ultrasound were performed to evaluate the lower extremity deep venous systems from the level of the common femoral vein and including the common femoral, femoral, profunda femoral, popliteal and calf veins including the posterior tibial, peroneal and gastrocnemius veins when visible. The superficial great saphenous vein was also interrogated. Spectral Doppler was utilized to evaluate flow at rest and with distal augmentation maneuvers in the common femoral, femoral and popliteal veins. COMPARISON:  Ultrasound dated 12/03/2014 FINDINGS: RIGHT LOWER EXTREMITY Common Femoral Vein: No evidence of thrombus. Normal compressibility, respiratory phasicity and response to augmentation. Saphenofemoral Junction: No evidence of thrombus. Normal compressibility and flow on color Doppler imaging. Profunda Femoral Vein: No evidence of thrombus. Normal compressibility and flow on color Doppler imaging. Femoral Vein: No evidence of thrombus. Normal compressibility, respiratory phasicity and response to augmentation. Popliteal Vein: No evidence of  thrombus. Normal compressibility, respiratory phasicity and response to augmentation. Calf Veins: The posterior tibial vein is patent. The peroneal vein is poorly visualized. Superficial Great Saphenous Vein: No evidence of thrombus. Normal compressibility. Other Findings:  Diffuse subcutaneous edema. LEFT LOWER EXTREMITY Common Femoral Vein: There is nonocclusive thrombus in the common femoral vein which may be related to chronic thrombus/scarring. Saphenofemoral Junction: There is nonocclusive thrombus. Profunda Femoral Vein: No evidence of thrombus. Normal compressibility and flow on color Doppler imaging. Femoral Vein: Peripheral echogenic and nonocclusive content noted in the femoral vein, likely related to chronic thrombus/scarring. Popliteal Vein: No evidence of thrombus. Normal compressibility, respiratory phasicity and response to augmentation. Calf Veins: The posterior tibial vein appears patent. The peroneal vein is not well visualized. Superficial Great Saphenous Vein: Not seen. Other Findings: There is a 6.3 x 1.8 x 7.2 cm left posterior popliteal cystic structure, likely a Baker's cyst. There is diffuse subcutaneous edema. IMPRESSION: 1. No evidence of acute DVT in the right lower extremity. 2. Nonocclusive thrombus involving the left common femoral and superficial femoral veins may represent residual chronic thrombus/scarring. An acute nonocclusive thrombus is not entirely excluded. Clinical correlation is recommended 3. Moderate-sized left popliteal fossa cyst. 4. Diffuse subcutaneous edema. These results were called by telephone at the time of interpretation on 04/29/2017 at 12:25 am to Dr. Dolores Frame , who verbally acknowledged these results. Electronically Signed   By: Elgie Collard M.D.   On: 04/29/2017 00:29   Dg Chest Portable 1 View  Result Date: 04/28/2017 CLINICAL DATA:  Weakness and lower extremity edema EXAM: PORTABLE CHEST 1 VIEW COMPARISON:  Chest radiograph 10/21/2016 FINDINGS: The  lungs are hyperinflated with unchanged areas of interstitial prominence. No focal airspace consolidation or pulmonary edema. No pleural effusion or pneumothorax. Normal cardiomediastinal contours. IMPRESSION: Hyperinflation without acute cardiopulmonary disease. Electronically Signed   By: Deatra Robinson M.D.   On: 04/28/2017 22:59     Medications:   . 0.9 % NaCl with KCl 20 mEq / L 50 mL/hr at 04/29/17 1401  . cefTRIAXone (ROCEPHIN) IVPB 2 gram/100 mL NS (Mini-Bag Plus) Stopped (04/30/17 0839)  . magnesium sulfate 1 - 4 g bolus IVPB     . atorvastatin  20 mg Oral Daily  . bisacodyl  5 mg Oral Daily  . docusate sodium  100 mg Oral BID  . enoxaparin (LOVENOX) injection  40 mg Subcutaneous Q24H  . feeding supplement (ENSURE ENLIVE)  237 mL Oral BID BM  . finasteride  5 mg Oral Daily  . insulin aspart  0-5 Units Subcutaneous QHS  . insulin aspart  0-9 Units Subcutaneous TID WC  . latanoprost  1 drop Both Eyes QHS  . sodium chloride  1 g Oral BID   acetaminophen **OR** acetaminophen, ondansetron **OR** ondansetron (ZOFRAN) IV  Assessment/ Plan:  84 y.o. male with diabetes, BPH, GERD, hyperlipidemia, hypertension presents with weakness and worsening leg edema and is found to have severe hyponatremia.  Hospital stay is complicated by left femoral nonocclusive DVT   1.  Severe hyponatremia and hypokalemia Likely multifactorial but exacerbated by Lasix which he  was taking at home Agree with discontinuing Lasix Agree with IV fluid hydration and monitoring sodium sodium closely. Baseline sodium appears to be 130 on March 20, 2017 Goal for correction by tomorrow morning is 122-124  2.  Weight loss and cachexia TSH and PSA are normal.  Patient had EGD and colonoscopy in 2015.  CT of the abdomen chest and pelvis with contrast is negative for any obvious malignancy. Likely failure to thrive Dietitian reviewed Patient wants to go home with his granddaughter this time.  3.  Lower extremity  edema Likely from third spacing of fluid 2D echo is done.  Results are pending  We will follow    LOS: 1 Ardeth Repetto Thedore Mins 2/24/201912:44 [facility] [place], [place] [phone]  Note: This note was prepared with Dragon dictation. Any transcription errors are unintentional

## 2017-04-30 NOTE — Progress Notes (Signed)
[employer] - [place] [facility] ([facility])   PATIENT NAME: [name]    MR#:  [id number]  DATE OF BIRTH:  [birth year]  SUBJECTIVE:  CHIEF COMPLAINT:   Chief Complaint  Patient presents with  . Weakness   Patient complains of some epigastric pain.  No nausea or vomiting.  REVIEW OF SYSTEMS:    Review of Systems  Constitutional: Positive for malaise/fatigue and weight loss. Negative for chills and fever.  HENT: Negative for sore throat.   Eyes: Negative for blurred vision, double vision and pain.  Respiratory: Negative for cough, hemoptysis, shortness of breath and wheezing.   Cardiovascular: Negative for chest pain, palpitations, orthopnea and leg swelling.  Gastrointestinal: Positive for abdominal pain and nausea. Negative for constipation, diarrhea, heartburn and vomiting.  Genitourinary: Negative for dysuria and hematuria.  Musculoskeletal: Negative for back pain and joint pain.  Skin: Negative for rash.  Neurological: Positive for weakness. Negative for sensory change, speech change, focal weakness and headaches.  Endo/Heme/Allergies: Does not bruise/bleed easily.  Psychiatric/Behavioral: Negative for depression. The patient is not nervous/anxious.     DRUG ALLERGIES:  No Known Allergies  VITALS:  Blood pressure 115/76, pulse 81, temperature 98.5 F (36.9 C), temperature source Oral, resp. rate 16, height 5\' 10"  (1.778 m), weight 54.2 kg (119 lb 8 oz), SpO2 97 %.  PHYSICAL EXAMINATION:   Physical Exam  GENERAL:  84 y.o.-year-old patient lying in the bed with no acute distress.  EYES: Pupils equal, round, reactive to light and accommodation. No scleral icterus. Extraocular muscles intact.  HEENT: Head atraumatic, normocephalic. Oropharynx and nasopharynx clear.  NECK:  Supple, no jugular venous distention. No thyroid enlargement, no tenderness.  LUNGS: Normal breath sounds bilaterally, no wheezing, rales, rhonchi. No use of accessory muscles of  respiration.  CARDIOVASCULAR: S1, S2 normal. No murmurs, rubs, or gallops.  ABDOMEN: Soft, nontender, nondistended. Bowel sounds present. No organomegaly or mass.  EXTREMITIES: No cyanosis, clubbing or edema b/l.    NEUROLOGIC: Cranial nerves II through XII are intact. No focal Motor or sensory deficits b/l.   PSYCHIATRIC: The patient is alert and oriented x 3.  SKIN: No obvious rash, lesion, or ulcer.   LABORATORY PANEL:   CBC Recent Labs  Lab 04/29/17 0613  WBC 11.8*  HGB 12.0*  HCT 34.5*  PLT 308   ------------------------------------------------------------------------------------------------------------------ Chemistries  Recent Labs  Lab 04/28/17 2151  04/29/17 1209  04/30/17 0721 04/30/17 1308  NA 113*   < >  --    < > 117* 121*  K 2.8*   < >  --   --  3.2*  --   CL 78*   < >  --   --  88*  --   CO2 23   < >  --   --  21*  --   GLUCOSE 182*   < >  --   --  144*  --   BUN 24*   < >  --   --  22*  --   CREATININE 1.01   < >  --   --  0.80  --   CALCIUM 7.8*   < >  --   --  7.4*  --   MG  --   --  1.0*  --   --   --   AST 61*  --   --   --   --   --   ALT 37  --   --   --   --   --  ALKPHOS 65  --   --   --   --   --   BILITOT 1.8*  --   --   --   --   --    < > = values in this interval not displayed.   ------------------------------------------------------------------------------------------------------------------  Cardiac Enzymes No results for input(s): TROPONINI in the last 168 hours. ------------------------------------------------------------------------------------------------------------------  RADIOLOGY:  Ct Chest W Contrast  Result Date: 04/29/2017 CLINICAL DATA:  84 y.o. male who presents with several days of increasing weakness, lower extremity swelling, foul-smelling urine. Patient and daughter states that he has had very poor p.o. intake. Here in the ED he was found to have hyponatremia. Also found to have UTI. Hospitalist were called for  admission." EXAM: CT CHEST, ABDOMEN, AND PELVIS WITH CONTRAST TECHNIQUE: Multidetector CT imaging of the chest, abdomen and pelvis was performed following the standard protocol during bolus administration of intravenous contrast. CONTRAST:  75mL ISOVUE-300 IOPAMIDOL (ISOVUE-300) INJECTION 61% COMPARISON:  None. FINDINGS: CT CHEST FINDINGS Cardiovascular: Heart is normal in size. There are dense three-vessel coronary artery calcifications. Great vessels normal in caliber. Atherosclerotic disease is noted along the aortic arch and descending thoracic aorta. Mediastinum/Nodes: No neck base or axillary masses or enlarged lymph nodes. Normal thyroid gland. No mediastinal or hilar masses or pathologically enlarged lymph nodes. Trachea is widely patent. Mild distention of the mid to upper thoracic esophagus. Esophagus otherwise unremarkable. Lungs/Pleura: Trace pleural effusions. Mild dependent lower lobe subsegmental atelectasis. Lungs otherwise clear with no evidence of pneumonia or pulmonary edema. No lung mass or suspicious nodule. No pneumothorax. CT ABDOMEN PELVIS FINDINGS Hepatobiliary: No focal liver abnormality is seen. No gallstones, gallbladder wall thickening, or biliary dilatation. Pancreas: Unremarkable. No pancreatic ductal dilatation or surrounding inflammatory changes. Spleen: Normal in size without focal abnormality. Adrenals/Urinary Tract: No adrenal masses. 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 No other renal masses or lesions. No stones. No hydronephrosis. There is bilateral renal cortical thinning. There is symmetric renal enhancement and excretion. Ureters are not well visualized due to ascites, but they appear to be normal in caliber and course. Bladder is decompressed with a Foley catheter. Stomach/Bowel: Stomach shows fairly bright enhancement of the mucosa with prominent mucosal folds of the proximal to mid stomach. No stomach mass. No convincing  wall thickening. There is somewhat prominent enhancement of the mucosa the proximal to mid small bowel. There is mild distention of small bowel without a transition point. No wall thickening. There is pneumatosis intestinalis along the right colon, with no wall thickening. Colon is mildly distended. There are scattered colonic air-fluid levels. No evidence of colonic obstruction. Air mildly to moderately distends the rectum. A normal appendix is visualized. Vascular/Lymphatic: The aorta is normal caliber. There is dense atherosclerosis with associated calcifications along the entire abdominal aorta. Calcified atherosclerotic plaque is noted at the origin of the celiac axis and superior mesenteric arteries without significant stenosis. There is atherosclerotic plaque at the origin of the renal arteries with evidence at least moderate stenosis on the right and mild to moderate stenosis on the left. The inferior mesenteric artery is patent. Dense atherosclerotic calcifications are noted extending into the iliac vessels. No discrete enlarged lymph nodes. Reproductive: No prosthetic enlargement or acute or significant abnormality. Other: Moderate amount ascites. No free intraperitoneal air. No abdominal wall hernia. MUSCULOSKELETAL FINDINGS There is marked flattening/resorption of the right femoral head with associated sclerosis and cystic change. 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 These findings may reflect advanced inflammatory arthropathy such as rheumatoid arthritis, chronic avascular necrosis with secondary degenerative change/arthropathy, or a combination. There is constructed trick hip joint space narrowing on the left. No acute fracture. No osteoblastic or osteolytic lesions. IMPRESSION: CHEST CT 1. No acute findings in the chest. No evidence of pneumonia or pulmonary edema. 2. Trace pleural effusions. 3. Dense  coronary artery calcifications. 4. Thoracic aortic atherosclerosis. ABDOMEN AND PELVIS CT 1. Moderate ascites. 2. Pneumatosis intestinalis involving the right colon. This may be benign in etiology. There is also increased enhancement of the wall the stomach and proximal small bowel, but no small bowel pneumatosis intestinalis. This enhancement is nonspecific. There is no bowel wall thickening. 3. There is significant aortic atherosclerotic disease. The superior mesenteric artery, celiac axis and inferior mesenteric arteries are relatively widely patent, which argues against mesenteric ischemia. 4. Narrowing of the main renal arteries due to atherosclerotic disease. 5. 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 Electronically Signed   By: Amie Portland M.D.   On: 04/29/2017 18:33   Ct Abdomen Pelvis W Contrast  Result Date: 04/29/2017 CLINICAL DATA:  84 y.o. male who presents with several days of increasing weakness, lower extremity swelling, foul-smelling urine. Patient and daughter states that he has had very poor p.o. intake. Here in the ED he was found to have hyponatremia. Also found to have UTI. Hospitalist were called for admission." EXAM: CT CHEST, ABDOMEN, AND PELVIS WITH CONTRAST TECHNIQUE: Multidetector CT imaging of the chest, abdomen and pelvis was performed following the standard protocol during bolus administration of intravenous contrast. CONTRAST:  75mL ISOVUE-300 IOPAMIDOL (ISOVUE-300) INJECTION 61% COMPARISON:  None. FINDINGS: CT CHEST FINDINGS Cardiovascular: Heart is normal in size. There are dense three-vessel coronary artery calcifications. Great vessels normal in caliber. Atherosclerotic disease is noted along the aortic arch and descending thoracic aorta. Mediastinum/Nodes: No neck base or axillary masses or enlarged lymph nodes. Normal thyroid gland. No mediastinal or hilar masses or pathologically enlarged lymph  nodes. Trachea is widely patent. Mild distention of the mid to upper thoracic esophagus. Esophagus otherwise unremarkable. Lungs/Pleura: Trace pleural effusions. Mild dependent lower lobe subsegmental atelectasis. Lungs otherwise clear with no evidence of pneumonia or pulmonary edema. No lung mass or suspicious nodule. No pneumothorax. CT ABDOMEN PELVIS FINDINGS Hepatobiliary: No focal liver abnormality is seen. No gallstones, gallbladder wall thickening, or biliary dilatation. Pancreas: Unremarkable. No pancreatic ductal dilatation or surrounding inflammatory changes. Spleen: Normal in size without focal abnormality. Adrenals/Urinary Tract: No adrenal masses. 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 No other renal masses or lesions. No stones. No hydronephrosis. There is bilateral renal cortical thinning. There is symmetric renal enhancement and excretion. Ureters are not well visualized due to ascites, but they appear to be normal in caliber and course. Bladder is decompressed with a Foley catheter. Stomach/Bowel: Stomach shows fairly bright enhancement of the mucosa with prominent mucosal folds of the proximal to mid stomach. No stomach mass. No convincing wall thickening. There is somewhat prominent enhancement of the mucosa the proximal to mid small bowel. There is mild distention of small bowel without a transition point. No wall thickening. There is pneumatosis intestinalis along the right colon, with no wall thickening. Colon is mildly distended. There are scattered colonic air-fluid levels. No evidence of colonic obstruction. Air mildly to moderately distends the rectum. A normal appendix  is visualized. Vascular/Lymphatic: The aorta is normal caliber. There is dense atherosclerosis with associated calcifications along the entire abdominal aorta. Calcified atherosclerotic plaque is noted at the origin of the celiac axis and superior mesenteric arteries without  significant stenosis. There is atherosclerotic plaque at the origin of the renal arteries with evidence at least moderate stenosis on the right and mild to moderate stenosis on the left. The inferior mesenteric artery is patent. Dense atherosclerotic calcifications are noted extending into the iliac vessels. No discrete enlarged lymph nodes. Reproductive: No prosthetic enlargement or acute or significant abnormality. Other: Moderate amount ascites. No free intraperitoneal air. No abdominal wall hernia. MUSCULOSKELETAL FINDINGS There is marked flattening/resorption of the right femoral head with associated sclerosis and cystic change. 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 These findings may reflect advanced inflammatory arthropathy such as rheumatoid arthritis, chronic avascular necrosis with secondary degenerative change/arthropathy, or a combination. There is constructed trick hip joint space narrowing on the left. No acute fracture. No osteoblastic or osteolytic lesions. IMPRESSION: CHEST CT 1. No acute findings in the chest. No evidence of pneumonia or pulmonary edema. 2. Trace pleural effusions. 3. Dense coronary artery calcifications. 4. Thoracic aortic atherosclerosis. ABDOMEN AND PELVIS CT 1. Moderate ascites. 2. Pneumatosis intestinalis involving the right colon. This may be benign in etiology. There is also increased enhancement of the wall the stomach and proximal small bowel, but no small bowel pneumatosis intestinalis. This enhancement is nonspecific. There is no bowel wall thickening. 3. There is significant aortic atherosclerotic disease. The superior mesenteric artery, celiac axis and inferior mesenteric arteries are relatively widely patent, which argues against mesenteric ischemia. 4. Narrowing of the main renal arteries due to atherosclerotic disease. 5. 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 Electronically Signed   By: Amie Portlandavid  Ormond M.D.   On: 04/29/2017 18:33   [place] Venous Img Lower Bilateral  Result Date: 04/29/2017 CLINICAL DATA:  84 year old male with bilateral lower extremity edema. EXAM: BILATERAL LOWER EXTREMITY VENOUS DOPPLER ULTRASOUND TECHNIQUE: Gray-scale sonography with graded compression, as well as color Doppler and duplex ultrasound were performed to evaluate the lower extremity deep venous systems from the level of the common femoral vein and including the common femoral, femoral, profunda femoral, popliteal and calf veins including the posterior tibial, peroneal and gastrocnemius veins when visible. The superficial great saphenous vein was also interrogated. Spectral Doppler was utilized to evaluate flow at rest and with distal augmentation maneuvers in the common femoral, femoral and popliteal veins. COMPARISON:  Ultrasound dated 12/03/2014 FINDINGS: RIGHT LOWER EXTREMITY Common Femoral Vein: No evidence of thrombus. Normal compressibility, respiratory phasicity and response to augmentation. Saphenofemoral Junction: No evidence of thrombus. Normal compressibility and flow on color Doppler imaging. Profunda Femoral Vein: No evidence of thrombus. Normal compressibility and flow on color Doppler imaging. Femoral Vein: No evidence of thrombus. Normal compressibility, respiratory phasicity and response to augmentation. Popliteal Vein: No evidence of thrombus. Normal compressibility, respiratory phasicity and response to augmentation. Calf Veins: The posterior tibial vein is patent. The peroneal vein is poorly visualized. Superficial Great Saphenous Vein: No evidence of thrombus. Normal compressibility. Other Findings:  Diffuse subcutaneous edema. LEFT LOWER EXTREMITY Common Femoral Vein: There is nonocclusive thrombus in the common femoral vein which may be related to chronic thrombus/scarring. Saphenofemoral Junction:  There is nonocclusive thrombus. Profunda Femoral Vein: No evidence of thrombus. Normal compressibility and flow on color  Doppler imaging. Femoral Vein: Peripheral echogenic and nonocclusive content noted in the femoral vein, likely related to chronic thrombus/scarring. Popliteal Vein: No evidence of thrombus. Normal compressibility, respiratory phasicity and response to augmentation. Calf Veins: The posterior tibial vein appears patent. The peroneal vein is not well visualized. Superficial Great Saphenous Vein: Not seen. Other Findings: There is a 6.3 x 1.8 x 7.2 cm left posterior popliteal cystic structure, likely a Baker's cyst. There is diffuse subcutaneous edema. IMPRESSION: 1. No evidence of acute DVT in the right lower extremity. 2. Nonocclusive thrombus involving the left common femoral and superficial femoral veins may represent residual chronic thrombus/scarring. An acute nonocclusive thrombus is not entirely excluded. Clinical correlation is recommended 3. Moderate-sized left popliteal fossa cyst. 4. Diffuse subcutaneous edema. These results were called by telephone at the time of interpretation on 04/29/2017 at 12:25 am to Dr. Dolores Frame , who verbally acknowledged these results. Electronically Signed   By: Elgie Collard M.D.   On: 04/29/2017 00:29   Dg Chest Portable 1 View  Result Date: 04/28/2017 CLINICAL DATA:  Weakness and lower extremity edema EXAM: PORTABLE CHEST 1 VIEW COMPARISON:  Chest radiograph 10/21/2016 FINDINGS: The lungs are hyperinflated with unchanged areas of interstitial prominence. No focal airspace consolidation or pulmonary edema. No pleural effusion or pneumothorax. Normal cardiomediastinal contours. IMPRESSION: Hyperinflation without acute cardiopulmonary disease. Electronically Signed   By: Deatra Robinson M.D.   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  Slowly improving.  Encourage  patient to eat better.  Dietitian consult. Patient nephrology help.  Repeat sodium tomorrow morning.  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  Blood pressure normal.  *Severe protein calorie malnutrition.  Significant weight loss.  CT scan abdomen and pelvis checked and shows no malignancies. Discussed with dietitian.  Calorie count.    All the records are reviewed and case discussed with Care Management/Social Worker Management plans discussed with the patient, family and they are in agreement.  CODE STATUS: DNR  DVT Prophylaxis: SCDs  TOTAL TIME TAKING CARE OF THIS PATIENT: 35 minutes.   POSSIBLE D/C IN 1-2 DAYS, DEPENDING ON CLINICAL CONDITION.  Molinda Bailiff Taiwo Fish M.D on 04/30/2017 at 1:53 PM  Between 7am to 6pm - Pager - [phone]  After 6pm go to [email or web address] - password [email or web address] [facility]  [facility] [place] [facility]  Office  [phone]  CC: Primary care physician; Lauro Regulus, MD  Note: This dictation was prepared with Dragon dictation along with smaller phrase technology. Any transcriptional errors that result from this process are unintentional.

## 2017-04-30 NOTE — Progress Notes (Signed)
Upon arrival [name] and [name] (granddaughter) engaged with chaplain in completing Advanced Directive. AD was copied and placed in patient file. Original returned to [place].     04/30/17 1900  Clinical Encounter Type  Visited With Patient and family together  Visit Type Initial  [facility]

## 2017-04-30 NOTE — Progress Notes (Signed)
Pt Na+ is 116. This is no change from previous Na+ level. MD notified. No new orders. Will continue to monitor.

## 2017-04-30 NOTE — Progress Notes (Signed)
Initial Nutrition Assessment  DOCUMENTATION CODES:   Severe malnutrition in context of social or environmental circumstances  INTERVENTION:  Continue Ensure Enlive po BID, each supplement provides 350 kcal and 20 grams of protein. Patient likes all 3 flavors.  Provide Magic cup TID with meals, each supplement provides 290 kcal and 9 grams of protein.  Provide daily MVI.  Discussed with granddaughter ways to increase calories and protein provided at meals at home. Encouraged addition of sugar, sauces, and gravy to meals.  Plan is to start 48 hour calorie count to get a better idea of how patient will be able to eat at home.  NUTRITION DIAGNOSIS:   Severe Malnutrition related to social / environmental circumstances(poor appetite, lived alone PTA, difficulty preparing meals, advanced age) as evidenced by severe fat depletion, severe muscle depletion.  GOAL:   Patient will meet greater than or equal to 90% of their needs  MONITOR:   PO intake, Supplement acceptance, Labs, Weight trends, Skin, I & O's  REASON FOR ASSESSMENT:   Malnutrition Screening Tool    ASSESSMENT:   84 year old male with PMHx of DM type 2, HTN, HLD, GERD, BPH with chronic Foley catheter who now presents with hyponatremia and UTI.   -Following SLP assessment patient was placed on dysphagia 1 diet with nectar-thick liquids. Approved for Ensure Enlive by SLP.  Met with patient and his granddaughter at bedside. Patient is known to this RD from previous admission. He reports his stomach has been hurting and is distended from constipation. He was started on a bowel regimen. Granddaughter reports he has had a poor appetite for a while now and does not taste food well anymore. He had been living home alone PTA. He was having difficulty preparing meals on his own as his legs were hurting. He reports only eating 2 small meals per day. After discharge he will be moving in with his granddaughter. His granddaughter's  husband is a Biomedical scientist at [employer] so he is familiar with preparing meals for dysphagia diets.  UBW was 170-185 lbs and weight loss has been slow over many years per granddaughter.  Medications reviewed and include: Dulcolax 5 mg daily, Colace, Novolog 0-9 units TID, Novolog 0-5 units QHS, sodium chloride tablet 1 gram BID, NS with KCl 20 mEq/L at 50 mL/hr, ceftriaxone, magnesium sulfate 4 grams IV once today.  Labs reviewed: CBG 139-175, Sodium 117, Potassium 3.2, Chloride 88, CO2 21, BUN 22.  Discussed with RN. Also discussed with MD. Plan is to start calorie count to get a better picture of how patient will be able to eat at home with meals being prepared for him.  NUTRITION - FOCUSED PHYSICAL EXAM:    Most Recent Value  Orbital Region  Severe depletion  Upper Arm Region  Severe depletion  Thoracic and Lumbar Region  Severe depletion  Buccal Region  Severe depletion  Temple Region  Severe depletion  Clavicle Bone Region  Severe depletion  Clavicle and Acromion Bone Region  Severe depletion  Scapular Bone Region  Severe depletion  Dorsal Hand  Severe depletion  Patellar Region  Severe depletion  Anterior Thigh Region  Severe depletion  Posterior Calf Region  Severe depletion  Edema (RD Assessment)  Moderate  Hair  Reviewed  Eyes  Reviewed  Mouth  Reviewed [poor dentition]  Skin  Reviewed [dry skin,  ecchymosis]  Nails  Reviewed     Diet Order:  DIET - DYS 1 Room service appropriate? Yes with Assist; Fluid consistency: Nectar  Thick Aspiration precautions  EDUCATION NEEDS:   Education needs have been addressed  Skin:  Skin Assessment: Skin Integrity Issues:(dry/flaky; blister to left leg)  Last BM:  PTA (04/25/2017 per chart)  Height:   Ht Readings from Last 1 Encounters:  04/29/17 _0  (1.778 m)    Weight:   Wt Readings from Last 1 Encounters:  04/29/17 119 lb 8 oz (54.2 kg)    Ideal Body Weight:  75.5 kg  BMI:  Body mass index is 17.15 kg/m.  Estimated  Nutritional Needs:   Kcal:  1470-1715 (MSJ x 1.2-1.4)  Protein:  75-85 grams (1.4-1.6 grams/kg)  Fluid:  1.3 L/day (25 mL/kg)  Willey Blade, MS, RD, LDN Office: [phone] Pager: [phone] After Hours/Weekend Pager: [phone]

## 2017-05-01 ENCOUNTER — Inpatient Hospital Stay: Payer: Medicare Other

## 2017-05-01 DIAGNOSIS — E871 Hypo-osmolality and hyponatremia: Secondary | ICD-10-CM

## 2017-05-01 DIAGNOSIS — F03A0 Unspecified dementia, mild, without behavioral disturbance, psychotic disturbance, mood disturbance, and anxiety: Secondary | ICD-10-CM

## 2017-05-01 DIAGNOSIS — F0390 Unspecified dementia without behavioral disturbance: Secondary | ICD-10-CM

## 2017-05-01 LAB — URINE CULTURE

## 2017-05-01 LAB — GLUCOSE, CAPILLARY
GLUCOSE-CAPILLARY: 153 mg/dL — AB (ref 65–99)
Glucose-Capillary: 110 mg/dL — ABNORMAL HIGH (ref 65–99)
Glucose-Capillary: 182 mg/dL — ABNORMAL HIGH (ref 65–99)
Glucose-Capillary: 185 mg/dL — ABNORMAL HIGH (ref 65–99)

## 2017-05-01 LAB — MAGNESIUM: MAGNESIUM: 2.4 mg/dL (ref 1.7–2.4)

## 2017-05-01 LAB — CBC WITH DIFFERENTIAL/PLATELET
Basophils Absolute: 0 10*3/uL (ref 0–0.1)
Basophils Relative: 0 %
Eosinophils Absolute: 0 10*3/uL (ref 0–0.7)
Eosinophils Relative: 0 %
HCT: 38.3 % — ABNORMAL LOW (ref 40.0–52.0)
Hemoglobin: 13.2 g/dL (ref 13.0–18.0)
LYMPHS ABS: 0.2 10*3/uL — AB (ref 1.0–3.6)
LYMPHS PCT: 1 %
MCH: 30.7 pg (ref 26.0–34.0)
MCHC: 34.5 g/dL (ref 32.0–36.0)
MCV: 88.9 fL (ref 80.0–100.0)
MONO ABS: 0.8 10*3/uL (ref 0.2–1.0)
MONOS PCT: 4 %
NEUTROS ABS: 19.6 10*3/uL — AB (ref 1.4–6.5)
Neutrophils Relative %: 95 %
Platelets: 330 10*3/uL (ref 150–440)
RBC: 4.31 MIL/uL — ABNORMAL LOW (ref 4.40–5.90)
RDW: 14.4 % (ref 11.5–14.5)
WBC: 20.6 10*3/uL — ABNORMAL HIGH (ref 3.8–10.6)

## 2017-05-01 LAB — SODIUM
SODIUM: 122 mmol/L — AB (ref 135–145)
Sodium: 123 mmol/L — ABNORMAL LOW (ref 135–145)
Sodium: 123 mmol/L — ABNORMAL LOW (ref 135–145)

## 2017-05-01 LAB — HEMOGLOBIN A1C
Hgb A1c MFr Bld: 5.9 % — ABNORMAL HIGH (ref 4.8–5.6)
MEAN PLASMA GLUCOSE: 123 mg/dL

## 2017-05-01 LAB — ANA W/REFLEX IF POSITIVE: ANA: NEGATIVE

## 2017-05-01 LAB — LACTIC ACID, PLASMA
Lactic Acid, Venous: 3.3 mmol/L (ref 0.5–1.9)
Lactic Acid, Venous: 2.2 mmol/L (ref 0.5–1.9)

## 2017-05-01 MED ORDER — POTASSIUM CHLORIDE 10 MEQ/100ML IV SOLN
10.0000 meq | INTRAVENOUS | Status: AC
  Administered 2017-05-01 (×3): 10 meq via INTRAVENOUS
  Filled 2017-05-01 (×3): qty 100

## 2017-05-01 MED ORDER — ALUM & MAG HYDROXIDE-SIMETH 200-200-20 MG/5ML PO SUSP
30.0000 mL | ORAL | Status: DC | PRN
  Administered 2017-05-01: 30 mL via ORAL
  Filled 2017-05-01: qty 30

## 2017-05-01 MED ORDER — SODIUM CHLORIDE 0.9 % IV BOLUS (SEPSIS): 500.0000 mL | Freq: Once | INTRAVENOUS | Status: DC

## 2017-05-01 MED ORDER — SODIUM CHLORIDE 0.9 % IV BOLUS (SEPSIS)
1000.0000 mL | Freq: Once | INTRAVENOUS | Status: AC
  Administered 2017-05-01: 14:00:00 1000 mL via INTRAVENOUS

## 2017-05-01 MED ORDER — BISACODYL 10 MG RE SUPP
10.0000 mg | Freq: Once | RECTAL | Status: AC
  Administered 2017-05-01: 12:00:00 10 mg via RECTAL
  Filled 2017-05-01: qty 1

## 2017-05-01 NOTE — Progress Notes (Addendum)
[employer] [facility] -  at [facility]   PATIENT NAME: [name]    MR#:  [id number]  DATE OF BIRTH:  30-Nov-1926  SUBJECTIVE:  CHIEF COMPLAINT:   Chief Complaint  Patient presents with  . Weakness   Complains of abdominal pain.  One episode of vomiting earlier today.  REVIEW OF SYSTEMS:    Review of Systems  Constitutional: Positive for malaise/fatigue and weight loss. Negative for chills and fever.  HENT: Negative for sore throat.   Eyes: Negative for blurred vision, double vision and pain.  Respiratory: Negative for cough, hemoptysis, shortness of breath and wheezing.   Cardiovascular: Negative for chest pain, palpitations, orthopnea and leg swelling.  Gastrointestinal: Positive for abdominal pain and nausea. Negative for constipation, diarrhea, heartburn and vomiting.  Genitourinary: Negative for dysuria and hematuria.  Musculoskeletal: Negative for back pain and joint pain.  Skin: Negative for rash.  Neurological: Positive for weakness. Negative for sensory change, speech change, focal weakness and headaches.  Endo/Heme/Allergies: Does not bruise/bleed easily.  Psychiatric/Behavioral: Negative for depression. The patient is not nervous/anxious.     DRUG ALLERGIES:  No Known Allergies  VITALS:  Blood pressure 130/78, pulse 92, temperature 97.6 F (36.4 C), temperature source Oral, resp. rate 18, height 5\' 10"  (1.778 m), weight 54.2 kg (119 lb 8 oz), SpO2 100 %.  PHYSICAL EXAMINATION:   Physical Exam  GENERAL:  84 y.o.-year-old patient lying in the bed with no acute distress.  EYES: Pupils equal, round, reactive to light and accommodation. No scleral icterus. Extraocular muscles intact.  HEENT: Head atraumatic, normocephalic. Oropharynx and nasopharynx clear.  NECK:  Supple, no jugular venous distention. No thyroid enlargement, no tenderness.  LUNGS: Normal breath sounds bilaterally, no wheezing, rales, rhonchi. No use of accessory muscles of  respiration.  CARDIOVASCULAR: S1, S2 normal. No murmurs, rubs, or gallops.  ABDOMEN: Soft, diffuse tenderness, nondistended. Bowel sounds decreased. No organomegaly or mass.  EXTREMITIES: No cyanosis, clubbing or edema b/l.    NEUROLOGIC: Cranial nerves II through XII are intact. No focal Motor or sensory deficits b/l.   PSYCHIATRIC: The patient is alert and oriented x 3.  SKIN: No obvious rash, lesion, or ulcer.   LABORATORY PANEL:   CBC Recent Labs  Lab 04/29/17 0613  WBC 11.8*  HGB 12.0*  HCT 34.5*  PLT 308   ------------------------------------------------------------------------------------------------------------------ Chemistries  Recent Labs  Lab 04/28/17 2151  04/30/17 0721  05/01/17 0442  NA 113*   < > 117*   < > 122*  K 2.8*   < > 3.2*  --   --   CL 78*   < > 88*  --   --   CO2 23   < > 21*  --   --   GLUCOSE 182*   < > 144*  --   --   BUN 24*   < > 22*  --   --   CREATININE 1.01   < > 0.80  --   --   CALCIUM 7.8*   < > 7.4*  --   --   MG  --    < >  --   --  2.4  AST 61*  --   --   --   --   ALT 37  --   --   --   --   ALKPHOS 65  --   --   --   --   BILITOT 1.8*  --   --   --   --    < > =  values in this interval not displayed.   ------------------------------------------------------------------------------------------------------------------  Cardiac Enzymes No results for input(s): TROPONINI in the last 168 hours. ------------------------------------------------------------------------------------------------------------------  RADIOLOGY:  Ct Chest W Contrast  Result Date: 04/29/2017 CLINICAL DATA:  84 y.o. male who presents with several days of increasing weakness, lower extremity swelling, foul-smelling urine. Patient and daughter states that he has had very poor p.o. intake. Here in the ED he was found to have hyponatremia. Also found to have UTI. Hospitalist were called for admission." EXAM: CT CHEST, ABDOMEN, AND PELVIS WITH CONTRAST TECHNIQUE:  Multidetector CT imaging of the chest, abdomen and pelvis was performed following the standard protocol during bolus administration of intravenous contrast. CONTRAST:  75mL ISOVUE-300 IOPAMIDOL (ISOVUE-300) INJECTION 61% COMPARISON:  None. FINDINGS: CT CHEST FINDINGS Cardiovascular: Heart is normal in size. There are dense three-vessel coronary artery calcifications. Great vessels normal in caliber. Atherosclerotic disease is noted along the aortic arch and descending thoracic aorta. Mediastinum/Nodes: No neck base or axillary masses or enlarged lymph nodes. Normal thyroid gland. No mediastinal or hilar masses or pathologically enlarged lymph nodes. Trachea is widely patent. Mild distention of the mid to upper thoracic esophagus. Esophagus otherwise unremarkable. Lungs/Pleura: Trace pleural effusions. Mild dependent lower lobe subsegmental atelectasis. Lungs otherwise clear with no evidence of pneumonia or pulmonary edema. No lung mass or suspicious nodule. No pneumothorax. CT ABDOMEN PELVIS FINDINGS Hepatobiliary: No focal liver abnormality is seen. No gallstones, gallbladder wall thickening, or biliary dilatation. Pancreas: Unremarkable. No pancreatic ductal dilatation or surrounding inflammatory changes. Spleen: Normal in size without focal abnormality. Adrenals/Urinary Tract: No adrenal masses. 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 No other renal masses or lesions. No stones. No hydronephrosis. There is bilateral renal cortical thinning. There is symmetric renal enhancement and excretion. Ureters are not well visualized due to ascites, but they appear to be normal in caliber and course. Bladder is decompressed with a Foley catheter. Stomach/Bowel: Stomach shows fairly bright enhancement of the mucosa with prominent mucosal folds of the proximal to mid stomach. No stomach mass. No convincing wall thickening. There is somewhat prominent enhancement of the mucosa the  proximal to mid small bowel. There is mild distention of small bowel without a transition point. No wall thickening. There is pneumatosis intestinalis along the right colon, with no wall thickening. Colon is mildly distended. There are scattered colonic air-fluid levels. No evidence of colonic obstruction. Air mildly to moderately distends the rectum. A normal appendix is visualized. Vascular/Lymphatic: The aorta is normal caliber. There is dense atherosclerosis with associated calcifications along the entire abdominal aorta. Calcified atherosclerotic plaque is noted at the origin of the celiac axis and superior mesenteric arteries without significant stenosis. There is atherosclerotic plaque at the origin of the renal arteries with evidence at least moderate stenosis on the right and mild to moderate stenosis on the left. The inferior mesenteric artery is patent. Dense atherosclerotic calcifications are noted extending into the iliac vessels. No discrete enlarged lymph nodes. Reproductive: No prosthetic enlargement or acute or significant abnormality. Other: Moderate amount ascites. No free intraperitoneal air. No abdominal wall hernia. MUSCULOSKELETAL FINDINGS There is marked flattening/resorption of the right femoral head with associated sclerosis and cystic change. 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 These findings may reflect advanced inflammatory arthropathy such as rheumatoid arthritis, chronic avascular necrosis with secondary degenerative change/arthropathy, or a combination. There is constructed trick hip joint space narrowing on  the left. No acute fracture. No osteoblastic or osteolytic lesions. IMPRESSION: CHEST CT 1. No acute findings in the chest. No evidence of pneumonia or pulmonary edema. 2. Trace pleural effusions. 3. Dense coronary artery calcifications. 4. Thoracic aortic atherosclerosis. ABDOMEN AND  PELVIS CT 1. Moderate ascites. 2. Pneumatosis intestinalis involving the right colon. This may be benign in etiology. There is also increased enhancement of the wall the stomach and proximal small bowel, but no small bowel pneumatosis intestinalis. This enhancement is nonspecific. There is no bowel wall thickening. 3. There is significant aortic atherosclerotic disease. The superior mesenteric artery, celiac axis and inferior mesenteric arteries are relatively widely patent, which argues against mesenteric ischemia. 4. Narrowing of the main renal arteries due to atherosclerotic disease. 5. 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 Electronically Signed   By: Amie Portland M.D.   On: 04/29/2017 18:33   Ct Abdomen Pelvis W Contrast  Result Date: 04/29/2017 CLINICAL DATA:  84 y.o. male who presents with several days of increasing weakness, lower extremity swelling, foul-smelling urine. Patient and daughter states that he has had very poor p.o. intake. Here in the ED he was found to have hyponatremia. Also found to have UTI. Hospitalist were called for admission." EXAM: CT CHEST, ABDOMEN, AND PELVIS WITH CONTRAST TECHNIQUE: Multidetector CT imaging of the chest, abdomen and pelvis was performed following the standard protocol during bolus administration of intravenous contrast. CONTRAST:  75mL ISOVUE-300 IOPAMIDOL (ISOVUE-300) INJECTION 61% COMPARISON:  None. FINDINGS: CT CHEST FINDINGS Cardiovascular: Heart is normal in size. There are dense three-vessel coronary artery calcifications. Great vessels normal in caliber. Atherosclerotic disease is noted along the aortic arch and descending thoracic aorta. Mediastinum/Nodes: No neck base or axillary masses or enlarged lymph nodes. Normal thyroid gland. No mediastinal or hilar masses or pathologically enlarged lymph nodes. Trachea is widely patent. Mild distention of the mid to upper thoracic  esophagus. Esophagus otherwise unremarkable. Lungs/Pleura: Trace pleural effusions. Mild dependent lower lobe subsegmental atelectasis. Lungs otherwise clear with no evidence of pneumonia or pulmonary edema. No lung mass or suspicious nodule. No pneumothorax. CT ABDOMEN PELVIS FINDINGS Hepatobiliary: No focal liver abnormality is seen. No gallstones, gallbladder wall thickening, or biliary dilatation. Pancreas: Unremarkable. No pancreatic ductal dilatation or surrounding inflammatory changes. Spleen: Normal in size without focal abnormality. Adrenals/Urinary Tract: No adrenal masses. 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 No other renal masses or lesions. No stones. No hydronephrosis. There is bilateral renal cortical thinning. There is symmetric renal enhancement and excretion. Ureters are not well visualized due to ascites, but they appear to be normal in caliber and course. Bladder is decompressed with a Foley catheter. Stomach/Bowel: Stomach shows fairly bright enhancement of the mucosa with prominent mucosal folds of the proximal to mid stomach. No stomach mass. No convincing wall thickening. There is somewhat prominent enhancement of the mucosa the proximal to mid small bowel. There is mild distention of small bowel without a transition point. No wall thickening. There is pneumatosis intestinalis along the right colon, with no wall thickening. Colon is mildly distended. There are scattered colonic air-fluid levels. No evidence of colonic obstruction. Air mildly to moderately distends the rectum. A normal appendix is visualized. Vascular/Lymphatic: The aorta is normal caliber. There is dense atherosclerosis with associated calcifications along the entire abdominal aorta. Calcified atherosclerotic plaque is noted at the origin of the celiac axis and superior mesenteric arteries without significant stenosis. There  is atherosclerotic plaque at the origin of the renal  arteries with evidence at least moderate stenosis on the right and mild to moderate stenosis on the left. The inferior mesenteric artery is patent. Dense atherosclerotic calcifications are noted extending into the iliac vessels. No discrete enlarged lymph nodes. Reproductive: No prosthetic enlargement or acute or significant abnormality. Other: Moderate amount ascites. No free intraperitoneal air. No abdominal wall hernia. MUSCULOSKELETAL FINDINGS There is marked flattening/resorption of the right femoral head with associated sclerosis and cystic change. 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 These findings may reflect advanced inflammatory arthropathy such as rheumatoid arthritis, chronic avascular necrosis with secondary degenerative change/arthropathy, or a combination. There is constructed trick hip joint space narrowing on the left. No acute fracture. No osteoblastic or osteolytic lesions. IMPRESSION: CHEST CT 1. No acute findings in the chest. No evidence of pneumonia or pulmonary edema. 2. Trace pleural effusions. 3. Dense coronary artery calcifications. 4. Thoracic aortic atherosclerosis. ABDOMEN AND PELVIS CT 1. Moderate ascites. 2. Pneumatosis intestinalis involving the right colon. This may be benign in etiology. There is also increased enhancement of the wall the stomach and proximal small bowel, but no small bowel pneumatosis intestinalis. This enhancement is nonspecific. There is no bowel wall thickening. 3. There is significant aortic atherosclerotic disease. The superior mesenteric artery, celiac axis and inferior mesenteric arteries are relatively widely patent, which argues against mesenteric ischemia. 4. Narrowing of the main renal arteries due to atherosclerotic disease. 5. 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 Electronically  Signed   By: Amie Portlandavid  Ormond M.D.   On: 04/29/2017 18:33   Dg Abd 2 Views  Result Date: 05/01/2017 CLINICAL DATA:  Vomiting, extreme abdominal distension EXAM: ABDOMEN - 2 VIEW COMPARISON:  CT abdomen/pelvis dated 04/29/2017 FINDINGS: Gaseous distension of small and large bowel. Cecum is mildly distended in the right lower quadrant. Moderate rectal stool burden. Overall, this appearance is nonspecific, but favors adynamic ileus. Cecal pneumatosis on CT is not radiographically evident. No evidence of free air under the diaphragm on the upright view. Degenerative changes of the visualized thoracolumbar spine. 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 IMPRESSION: Gaseous distention of small and large bowel, as described above, favoring adynamic ileus. Cecal pneumatosis on CT is not radiographically evident. No free air. Electronically Signed   By: Charline BillsSriyesh  Krishnan M.D.   On: 05/01/2017 10:05     ASSESSMENT AND PLAN:   *Ileus N.p.o.  Patient had some pneumatosis coli on the CT scan but did not have any symptoms at that point.  Discussed with Dr. Aleen CampiPiscoya of surgery.  Will see patient.  No immediate surgery needed.  Will check CBC and lactic acid.  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 Slowly improving.   Appreciate nephrology help.  Repeat sodium tomorrow morning.  *  UTI (urinary tract infection), catheter related.  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  Blood pressure normal.  *Severe protein calorie malnutrition.  Significant weight loss.  CT scan abdomen and pelvis checked and showsed no malignancies.  All the records are reviewed  and case discussed with Care Management/Social Worker Management plans discussed with the patient, family and they are in agreement.  CODE STATUS: DNR  DVT Prophylaxis: SCDs   Patient with elevated lactic acid and abdominal pain.  We will bolus 1 L normal saline stat.  TOTAL CRITICAL CARE TIME TAKING CARE OF THIS PATIENT: 35 minutes.   POSSIBLE D/C IN 1-2 DAYS, DEPENDING ON CLINICAL CONDITION.  Molinda Bailiff Tagan Bartram M.D on 05/01/2017 at 11:56 AM  Between 7am to 6pm - Pager - [phone]  After 6pm go to [email or web address] - password [email or web address] [facility]  [facility] [place] [facility]  Office  [phone]  CC: Primary care physician; Lauro Regulus, MD  Note: This dictation was prepared with Dragon dictation along with smaller phrase technology. Any transcriptional errors that result from this process are unintentional.

## 2017-05-01 NOTE — Consult Note (Signed)
[facility] Consult   Reason for Consult: Consult for 84 year old man without any past psychiatric history because of request for a capacity evaluation Referring Physician: [place] Patient Identification: [name] MRN:  [id number] Principal Diagnosis: Mild dementia Diagnosis:   Patient Active Problem List   Diagnosis Date Noted  . Mild dementia [F03.90] 05/01/2017  . Protein-calorie malnutrition, severe [E43] 03/20/2017  . UTI (urinary tract infection) [N39.0] 03/17/2017  . Hyponatremia [E87.1] 03/17/2017  . BPH with obstruction/lower urinary tract symptoms [N40.1, N13.8] 03/17/2017  . Diabetes ([place]) [E11.9] 03/17/2017  . HTN (hypertension) [I10] 03/17/2017  . GERD (gastroesophageal reflux disease) [K21.9] 03/17/2017  . HLD (hyperlipidemia) [E78.5] 03/17/2017    Total Time spent with patient: 1 hour  Subjective:   [name] is a 84 y.o. male patient admitted with "well, you see it was my knee".  HPI: Patient seen chart reviewed.  Spoke with the patient's granddaughter.  Spoke with nursing.  Nursing help to clarify that the consult was requested because an attorney had wanted some sort of opinion about the patient's ability to sign legal documents.  Granddaughter is a little bit more clear that the attorney has said he will come by tomorrow and finalized paperwork for power of attorney but wanted some kind of verification that the patient was capable of signing.  Patient was awake and alert when I found him.  This is an 84 year old man who came into the hospital with generalized weakness and was found to have a urinary tract infection.  Patient tells me in general he is feeling better although he still very weak and cannot stand up.  He tells me he is glad he will be able to stay with his granddaughter when he leaves here.  Patient denies any depression.  Denies any hallucinations.  Denies suicidal thoughts.  He is aware of having some degree of memory  impairment.  Patient is able to tell me the basics of his medical problems.  He tells me he had been living independently before coming to the hospital ever since his wife died but is glad that he will now have someone else to live with.  He understands that it is not really safe for him to live on his own.  Patient was able to tell me that the paperwork in question was a power of attorney.  He was able to tell me that a power of attorney was a way to give another person the right to make decisions for you.  I explained to the patient that there was a healthcare power of attorney and another type of power of attorney that gave the person authority over one's possessions and other legal decisions.  I explained that if he granted legal power of attorney to his granddaughter she would be able to have access to his money potentially and also she would be able to make what could potentially be life and death decisions for him when he was not able to decide for himself.  Patient understood these things and said he was in full agreement with all of them.  Social history: Retired Administrator.  Widower.  Closest relative is his granddaughter.  She is volunteering to be power of attorney and confirmed to me that she will be letting him stay with her in the foreseeable future.  Medical history: Patient is pretty underweight.  Looks debilitated.  Apparently had a serious urinary tract infection when he came in.  Chronic joint pain.  History of diabetes.  Substance abuse history: Denies any  Past Psychiatric History: Patient denies any past psychiatric history.  There is nothing in the record to contradict this.  No known history of psychiatric treatment no history of suicide attempts no history of hospitalization or dangerous behavior.  Risk to Self: Is patient at risk for suicide?: No Risk to Others:   Prior Inpatient Therapy:   Prior Outpatient Therapy:    Past Medical History:  Past Medical History:   Diagnosis Date  . BPH (benign prostatic hyperplasia)   . Diabetes mellitus without complication ([place])   . GERD (gastroesophageal reflux disease)   . HLD (hyperlipidemia)   . Hypertension     Past Surgical History:  Procedure Laterality Date  . HIP FRACTURE SURGERY    . KNEE SURGERY     Family History:  Family History  Problem Relation Age of Onset  . Diabetes Father   . Hypertension Father   . Prostate cancer Father    Family Psychiatric  History: Does not know of any Social History:  Social History   Substance and Sexual Activity  Alcohol Use No     Social History   Substance and Sexual Activity  Drug Use No    Social History   Socioeconomic History  . Marital status: Widowed    Spouse name: None  . Number of children: None  . Years of education: None  . Highest education level: None  Social Needs  . Financial resource strain: None  . Food insecurity - worry: None  . Food insecurity - inability: None  . Transportation needs - medical: None  . Transportation needs - non-medical: None  Occupational History  . None  Tobacco Use  . Smoking status: Never Smoker  . Smokeless tobacco: Never Used  Substance and Sexual Activity  . Alcohol use: No  . Drug use: No  . Sexual activity: No  Other Topics Concern  . None  Social History Narrative  . None   Additional Social History:    Allergies:  No Known Allergies  Labs:  Results for orders placed or performed during the hospital encounter of 04/28/17 (from the past 48 hour(s))  Glucose, capillary     Status: Abnormal   Collection Time: 04/29/17  8:13 PM  Result Value Ref Range   Glucose-Capillary 175 (H) 65 - 99 mg/dL  Hemoglobin A1c     Status: Abnormal   Collection Time: 04/30/17 12:27 AM  Result Value Ref Range   Hgb A1c MFr Bld 5.9 (H) 4.8 - 5.6 %    Comment: (NOTE)         Prediabetes: 5.7 - 6.4         Diabetes: >6.4         Glycemic control for adults with diabetes: <7.0    Mean Plasma  Glucose 123 mg/dL    Comment: (NOTE) Performed At: [facility] [place], [place] [id number] Rush Farmer MD [id number] Performed at [facility], [address] [place], [place] [address]   Sodium     Status: Abnormal   Collection Time: 04/30/17 12:27 AM  Result Value Ref Range   Sodium 116 (LL) 135 - 145 mmol/L    Comment: CRITICAL RESULT CALLED TO, READ BACK BY AND VERIFIED WITH YAKANA CROSS AT 0057 04/30/17.PMH Performed at [facility], [address], [place], [place] [address]   Basic metabolic panel     Status: Abnormal   Collection Time: 04/30/17  7:21 AM  Result Value  Ref Range   Sodium 117 (LL) 135 - 145 mmol/L    Comment: CRITICAL RESULT CALLED TO, READ BACK BY AND VERIFIED WITH STEPHANIE KENNEDY 04/30/17 @ 0757  MLK    Potassium 3.2 (L) 3.5 - 5.1 mmol/L   Chloride 88 (L) 101 - 111 mmol/L   CO2 21 (L) 22 - 32 mmol/L   Glucose, Bld 144 (H) 65 - 99 mg/dL   BUN 22 (H) 6 - 20 mg/dL   Creatinine, Ser 0.80 0.61 - 1.24 mg/dL   Calcium 7.4 (L) 8.9 - 10.3 mg/dL   GFR calc non Af Amer >60 >60 mL/min   GFR calc Af Amer >60 >60 mL/min    Comment: (NOTE) The eGFR has been calculated using the CKD EPI equation. This calculation has not been validated in all clinical situations. eGFR's persistently <60 mL/min signify possible Chronic Kidney Disease.    Anion gap 8 5 - 15    Comment: Performed at [facility], [address] [place], [place] [address]  Glucose, capillary     Status: Abnormal   Collection Time: 04/30/17  7:49 AM  Result Value Ref Range   Glucose-Capillary 139 (H) 65 - 99 mg/dL  Glucose, capillary     Status: Abnormal   Collection Time: 04/30/17 12:28 PM  Result Value Ref Range   Glucose-Capillary 160 (H) 65 - 99 mg/dL  Sodium     Status: Abnormal   Collection Time: 04/30/17  1:08 PM  Result Value Ref Range   Sodium 121 (L) 135 - 145 mmol/L    Comment: Performed at [facility], [address] [place], [place] [address]  Glucose, capillary     Status: Abnormal   Collection Time: 04/30/17  4:30 PM  Result Value Ref Range   Glucose-Capillary 156 (H) 65 - 99 mg/dL  Glucose, capillary     Status: Abnormal   Collection Time: 04/30/17  5:58 PM  Result Value Ref Range   Glucose-Capillary 155 (H) 65 - 99 mg/dL  Sodium     Status: Abnormal   Collection Time: 04/30/17  9:08 PM  Result Value Ref Range   Sodium 120 (L) 135 - 145 mmol/L    Comment: Performed at [facility], [address] [place], [place] [address]  Glucose, capillary     Status: Abnormal   Collection Time: 04/30/17  9:24 PM  Result Value Ref Range   Glucose-Capillary 143 (H) 65 - 99 mg/dL  Sodium     Status: Abnormal   Collection Time: 05/01/17  4:42 AM  Result Value Ref Range   Sodium 122 (L) 135 - 145 mmol/L    Comment: Performed at [facility], [address], [place], [place] [address]  Magnesium     Status: None   Collection Time: 05/01/17  4:42 AM  Result Value Ref Range   Magnesium 2.4 1.7 - 2.4 mg/dL    Comment: Performed at [facility], [address] [place], [place] [address]  Glucose, capillary     Status: Abnormal   Collection Time: 05/01/17  7:33 AM  Result Value Ref Range   Glucose-Capillary 185 (H) 65 - 99 mg/dL  Glucose, capillary     Status: Abnormal   Collection Time: 05/01/17 12:03 PM  Result Value Ref Range   Glucose-Capillary 182 (H) 65 - 99 mg/dL  Sodium     Status: Abnormal   Collection Time: 05/01/17 12:44 PM  Result Value Ref Range   Sodium 123 (L) 135 -  145 mmol/L    Comment: Performed at [facility], [address] [place], [place] [address]  CBC with Differential/Platelet     Status: Abnormal   Collection Time: 05/01/17 12:44 PM  Result Value Ref Range   WBC 20.6 (H) 3.8 - 10.6 K/uL   RBC 4.31 (L) 4.40 - 5.90 MIL/uL   Hemoglobin 13.2 13.0 - 18.0 g/dL   HCT 38.3 (L) 40.0 - 52.0 %   MCV 88.9 80.0 - 100.0 fL   MCH 30.7  26.0 - 34.0 pg   MCHC 34.5 32.0 - 36.0 g/dL   RDW 14.4 11.5 - 14.5 %   Platelets 330 150 - 440 K/uL   Neutrophils Relative % 95 %   Neutro Abs 19.6 (H) 1.4 - 6.5 K/uL   Lymphocytes Relative 1 %   Lymphs Abs 0.2 (L) 1.0 - 3.6 K/uL   Monocytes Relative 4 %   Monocytes Absolute 0.8 0.2 - 1.0 K/uL   Eosinophils Relative 0 %   Eosinophils Absolute 0.0 0 - 0.7 K/uL   Basophils Relative 0 %   Basophils Absolute 0.0 0 - 0.1 K/uL    Comment: Performed at [facility], [address] [place], [place] [address]  Lactic acid, plasma     Status: Abnormal   Collection Time: 05/01/17 12:44 PM  Result Value Ref Range   Lactic Acid, Venous 3.3 (HH) 0.5 - 1.9 mmol/L    Comment: CRITICAL RESULT CALLED TO, READ BACK BY AND VERIFIED WITH MATTHEW WRIGHT ON 05/01/17 AT 1332 QSD Performed at [facility], [address] [place], [place] [address]   Lactic acid, plasma     Status: Abnormal   Collection Time: 05/01/17  3:38 PM  Result Value Ref Range   Lactic Acid, Venous 2.2 (HH) 0.5 - 1.9 mmol/L    Comment: CRITICAL RESULT CALLED TO, READ BACK BY AND VERIFIED WITH BRANDY MILLER ON 05/01/17 AT 1652 [facility] Performed at [facility], [address] [place], [place] [address]   Glucose, capillary     Status: Abnormal   Collection Time: 05/01/17  4:56 PM  Result Value Ref Range   Glucose-Capillary 153 (H) 65 - 99 mg/dL    Current Facility-Administered Medications  Medication Dose Route Frequency Provider Last Rate Last Dose  . 0.9 % NaCl with KCl 20 mEq/ L  infusion   Intravenous Continuous Hillary Bow, MD 50 mL/hr at 05/01/17 0527    . acetaminophen (TYLENOL) tablet 650 mg  650 mg Oral Q6H PRN Lance Coon, MD       Or  . acetaminophen (TYLENOL) suppository 650 mg  650 mg Rectal Q6H PRN Lance Coon, MD      . alum & mag hydroxide-simeth (MAALOX/MYLANTA) 200-200-20 MG/5ML suspension 30 mL  30 mL Oral Q4H PRN Hillary Bow, MD   30 mL at 05/01/17 0745  . atorvastatin  (LIPITOR) tablet 20 mg  20 mg Oral Daily Lance Coon, MD   20 mg at 04/30/17 4332  . bisacodyl (DULCOLAX) EC tablet 5 mg  5 mg Oral Daily Murlean Iba, MD   5 mg at 04/30/17 1254  . docusate sodium (COLACE) capsule 100 mg  100 mg Oral BID Murlean Iba, MD   100 mg at 04/30/17 2001  . enoxaparin (LOVENOX) injection 40 mg  40 mg Subcutaneous Q24H Lance Coon, MD   40 mg at 04/30/17 2001  . feeding supplement (ENSURE ENLIVE) (ENSURE ENLIVE) liquid 237 mL  237 mL Oral BID BM Hillary Bow, MD      .  finasteride (PROSCAR) tablet 5 mg  5 mg Oral Daily Lance Coon, MD   5 mg at 04/30/17 6811  . insulin aspart (novoLOG) injection 0-5 Units  0-5 Units Subcutaneous QHS Lance Coon, MD      . insulin aspart (novoLOG) injection 0-9 Units  0-9 Units Subcutaneous TID WC Lance Coon, MD   2 Units at 05/01/17 1756  . latanoprost (XALATAN) 0.005 % ophthalmic solution 1 drop  1 drop Both Eyes QHS Lance Coon, MD   1 drop at 04/30/17 2204  . ondansetron (ZOFRAN) tablet 4 mg  4 mg Oral Q6H PRN Lance Coon, MD       Or  . ondansetron [facility]) injection 4 mg  4 mg Intravenous Q6H PRN Lance Coon, MD   4 mg at 05/01/17 1233  . piperacillin-tazobactam (ZOSYN) IVPB 3.375 g  3.375 g Intravenous Q8H Maccia, Melissa D, RPH 12.5 mL/hr at 05/01/17 1807 3.375 g at 05/01/17 1807  . sodium chloride tablet 1 g  1 g Oral BID Lance Coon, MD   1 g at 04/30/17 2004    Musculoskeletal: Strength & Muscle Tone: decreased Gait & Station: unable to stand Patient leans: N/A  Psychiatric Specialty Exam: Physical Exam  Nursing note and vitals reviewed. Constitutional: He appears well-developed.  HENT:  Head: Normocephalic and atraumatic.  Eyes: Conjunctivae are normal. Pupils are equal, round, and reactive to light.  Neck: Normal range of motion.  Cardiovascular: Regular rhythm and normal heart sounds.  Respiratory: Effort normal. No respiratory distress.  GI: Soft.  Musculoskeletal: Normal range of  motion.  Neurological: He is alert.  Skin: Skin is warm and dry.  Psychiatric: Judgment normal. His affect is blunt. His speech is tangential. He is slowed. Thought content is not paranoid. Cognition and memory are impaired. He expresses no homicidal and no suicidal ideation. He exhibits abnormal recent memory.    Review of Systems  HENT: Negative.   Eyes: Negative.   Respiratory: Negative.   Cardiovascular: Negative.   Gastrointestinal: Negative.   Musculoskeletal: Negative.   Skin: Negative.   Neurological: Positive for weakness.  Psychiatric/Behavioral: Positive for memory loss. Negative for depression, hallucinations, substance abuse and suicidal ideas. The patient is not nervous/anxious and does not have insomnia.     Blood pressure 118/78, pulse 94, temperature 97.7 F (36.5 C), temperature source Oral, resp. rate 18, height 5' 10"  (1.778 m), weight 54.2 kg (119 lb 8 oz), SpO2 96 %.Body mass index is 17.15 kg/m.  General Appearance: Casual  Eye Contact:  Fair  Speech:  Slow  Volume:  Decreased  Mood:  Euthymic  Affect:  Constricted  Thought Process:  Goal Directed  Orientation:  Full (Time, Place, and Person)  Thought Content:  Tangential  Suicidal Thoughts:  No  Homicidal Thoughts:  No  Memory:  Immediate;   Fair Recent;   Fair Remote;   Fair Doing the memory testing was difficult.  Patient, who otherwise was pretty lucid, did not seem to understand that I was asking him to repeat words.  Instead he started talking about the words themselves.  Ultimately I was able to confirm that he remembered at least 2 out of 3 of them.  Judgement:  Fair  Insight:  Fair  Psychomotor Activity:  Decreased  Concentration:  Concentration: Fair  Recall:  [employer] of Knowledge:  Fair  Language:  Fair  Akathisia:  No  Handed:  Right  AIMS (if indicated):     Assets:  Desire for Improvement Social Support  ADL's:  Impaired  Cognition:  Impaired,  Mild  Sleep:        Treatment  Plan Summary: Plan 84 year old man who appears to have a mild degree of dementia but who is completely oriented and able to discuss his medical problems.  He is able to focus on the question at hand.  He was able to show understanding of what a power of attorney entailed.  He was able to give full agreement without confusion to the understanding of giving his granddaughter power over his medical decisions and financial and legal decisions.  Patient appears to have the capacity to make a reasonable decision and granting power of attorney.  He was quite enthusiastic about how much she trusted his granddaughter.  Does not appear to need psychiatric hospitalization or any psychiatric medicine.  Disposition: No evidence of imminent risk to self or others at present.   Patient does not meet criteria for psychiatric inpatient admission.  Alethia Berthold, MD 05/01/2017 6:23 PM

## 2017-05-01 NOTE — Progress Notes (Signed)
Pt. Exhibited brown emesis observed about 7:50 am. MD aware Xray performed. Surgical consult placed. NG tube ordered. Pt. Has stopped vomiting and had 1 passage of stool since NG tube ordered. Surgeon still recommends place of tube. Pt is currently refusing NG tube. Night time nurse aware of circumstances.

## 2017-05-01 NOTE — Progress Notes (Signed)
CRITICAL VALUE ALERT  Critical Value:  Lactic acid  Date & Time Notied:  1:41pm 05/01/2017  Provider Notified: [place]  Orders Received/Actions taken: NS bolus

## 2017-05-01 NOTE — Progress Notes (Signed)
[facility]  ROUNDING NOTE   Subjective:   Na 123.   Objective:  Vital signs in last 24 hours:  Temp:  [97.2 F (36.2 C)-97.7 F (36.5 C)] 97.7 F (36.5 C) (02/25 1349) Pulse Rate:  [77-94] 94 (02/25 1349) Resp:  [18] 18 (02/25 0435) BP: (114-130)/(69-78) 118/78 (02/25 1349) SpO2:  [96 %-100 %] 96 % (02/25 1349)  Weight change:  Filed Weights   04/28/17 2130 04/29/17 0241  Weight: 56.7 kg (125 lb) 54.2 kg (119 lb 8 oz)    Intake/Output: I/O last 3 completed shifts: In: 1999.2 [I.V.:1899.2; IV Piggyback:100] Out: 2650 [Urine:2650]   Intake/Output this shift:  No intake/output data recorded.  Physical Exam: General: cachectic  Head: Normocephalic, atraumatic. Moist oral mucosal membranes  Eyes: Anicteric, PERRL  Neck: Supple, trachea midline  Lungs:  Clear to auscultation  Heart: Regular rate and rhythm  Abdomen:  Soft, nontender,   Extremities: + peripheral edema.  Neurologic: Nonfocal, moving all four extremities  Skin: No lesions       Basic Metabolic Panel: Recent Labs  Lab 04/28/17 2151 04/29/17 0613 04/29/17 1209  04/30/17 0721 04/30/17 1308 04/30/17 2108 05/01/17 0442 05/01/17 1244  NA 113* 114*  --    < > 117* 121* 120* 122* 123*  K 2.8* 2.8*  --   --  3.2*  --   --   --   --   CL 78* 83*  --   --  88*  --   --   --   --   CO2 23 18*  --   --  21*  --   --   --   --   GLUCOSE 182* 132*  --   --  144*  --   --   --   --   BUN 24* 21*  --   --  22*  --   --   --   --   CREATININE 1.01 0.72  --   --  0.80  --   --   --   --   CALCIUM 7.8* 7.2*  --   --  7.4*  --   --   --   --   MG  --   --  1.0*  --   --   --   --  2.4  --    < > = values in this interval not displayed.    Liver Function Tests: Recent Labs  Lab 04/28/17 2151  AST 61*  ALT 37  ALKPHOS 65  BILITOT 1.8*  PROT 5.8*  ALBUMIN 3.2*   No results for input(s): LIPASE, AMYLASE in the last 168 hours. No results for input(s): AMMONIA in the last 168  hours.  CBC: Recent Labs  Lab 04/28/17 2151 04/29/17 0613 05/01/17 1244  WBC 10.6 11.8* 20.6*  NEUTROABS  --   --  19.6*  HGB 12.4* 12.0* 13.2  HCT 34.5* 34.5* 38.3*  MCV 86.6 87.5 88.9  PLT 367 308 330    Cardiac Enzymes: No results for input(s): CKTOTAL, CKMB, CKMBINDEX, TROPONINI in the last 168 hours.  BNP: Invalid input(s): POCBNP  CBG: Recent Labs  Lab 04/30/17 1758 04/30/17 2124 05/01/17 0733 05/01/17 1203 05/01/17 1656  GLUCAP 155* 143* 185* 182* 153*    Microbiology: Results for orders placed or performed during the hospital encounter of 04/28/17  Urine Culture     Status: Abnormal   Collection Time: 04/29/17  1:03 AM  Result Value Ref Range  Status   Specimen Description   Final    URINE, RANDOM Performed at [facility], [address] [place], [place] [address]    Special Requests   Final    NONE Performed at [facility], [address] [place], [place] [address]    Culture (A)  Final    >=100,000 COLONIES/mL STENOTROPHOMONAS MALTOPHILIA >=100,000 COLONIES/mL ENTEROCOCCUS FAECALIS    Report Status 05/01/2017 FINAL  Final   Organism ID, Bacteria ENTEROCOCCUS FAECALIS (A)  Final   Organism ID, Bacteria STENOTROPHOMONAS MALTOPHILIA (A)  Final      Susceptibility   Enterococcus faecalis - MIC*    AMPICILLIN <=2 SENSITIVE Sensitive     LEVOFLOXACIN 0.5 SENSITIVE Sensitive     NITROFURANTOIN <=16 SENSITIVE Sensitive     VANCOMYCIN 1 SENSITIVE Sensitive     * >=100,000 COLONIES/mL ENTEROCOCCUS FAECALIS   Stenotrophomonas maltophilia - MIC*    LEVOFLOXACIN 2 SENSITIVE Sensitive     TRIMETH/SULFA <=20 SENSITIVE Sensitive     * >=100,000 COLONIES/mL STENOTROPHOMONAS MALTOPHILIA    Coagulation Studies: No results for input(s): LABPROT, INR in the last 72 hours.  Urinalysis: Recent Labs    04/29/17 0103  COLORURINE YELLOW*  LABSPEC 1.012  PHURINE 5.0  GLUCOSEU NEGATIVE  HGBUR MODERATE*  BILIRUBINUR NEGATIVE   KETONESUR 5*  PROTEINUR NEGATIVE  NITRITE NEGATIVE  LEUKOCYTESUR LARGE*      Imaging: Dg Abd 2 Views  Result Date: 05/01/2017 CLINICAL DATA:  Vomiting, extreme abdominal distension EXAM: ABDOMEN - 2 VIEW COMPARISON:  CT abdomen/pelvis dated 04/29/2017 FINDINGS: Gaseous distension of small and large bowel. Cecum is mildly distended in the right lower quadrant. Moderate rectal stool burden. Overall, this appearance is nonspecific, but favors adynamic ileus. Cecal pneumatosis on CT is not radiographically evident. No evidence of free air under the diaphragm on the upright view. Degenerative changes of the visualized thoracolumbar spine. 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 IMPRESSION: Gaseous distention of small and large bowel, as described above, favoring adynamic ileus. Cecal pneumatosis on CT is not radiographically evident. No free air. Electronically Signed   By: Charline BillsSriyesh  Krishnan M.D.   On: 05/01/2017 10:05     Medications:   . 0.9 % NaCl with KCl 20 mEq / L 50 mL/hr at 05/01/17 0527  . piperacillin-tazobactam (ZOSYN)  IV Stopped (05/01/17 0941)   . atorvastatin  20 mg Oral Daily  . bisacodyl  5 mg Oral Daily  . docusate sodium  100 mg Oral BID  . enoxaparin (LOVENOX) injection  40 mg Subcutaneous Q24H  . feeding supplement (ENSURE ENLIVE)  237 mL Oral BID BM  . finasteride  5 mg Oral Daily  . insulin aspart  0-5 Units Subcutaneous QHS  . insulin aspart  0-9 Units Subcutaneous TID WC  . latanoprost  1 drop Both Eyes QHS  . sodium chloride  1 g Oral BID   acetaminophen **OR** acetaminophen, alum & mag hydroxide-simeth, ondansetron **OR** ondansetron (ZOFRAN) IV  Assessment/ Plan:  [name] is a 84 y.o.  male with diabetes, BPH, GERD, hyperlipidemia, hypertension presents with weakness and worsening leg edema and is found to have severe hyponatremia.  Hospital stay is complicated by left femoral  nonocclusive DVT   1. hyponatremia and hypokalemia: secondary to congestive heart failure.  - Continue IV fluids   LOS: 2 [name] 2/25/20195:47 PM

## 2017-05-01 NOTE — Consult Note (Signed)
Date of Consultation:  05/01/2017  Requesting Physician:  Milagros Loll, MD  Reason for Consultation:  Pneumatosis  History of Present Illness: [name] is a 84 y.o. male admitted on 04/28/17 with weakness, failure to thrive, hyponatremia, and UTI.  He had an initial CT scan on 2/22 which showed ascites and pneumatosis of right colon.  He had a normal WBC at the time and no abdominal symptoms.  He was on ceftriaxone initially for UTI but cultures yesterday came back positive for Enterococcus and Stenotrophomonas and was changed to Zosyn yesterday.  He had episodes of emesis today of significant volume and was complaining of abdominal distention and discomfort in the lower abdomen.  His vitals have remained stable and has been afebrile.  His KUB today after the emesis showed diffuse ileus with no free air.  Surgery was consulted for further evaluation.  On evaluation, the patient denies any abdominal pain but feels distended.  He had emesis today.  He felt like he had to have a bowel movement and had a non-bloody BM early afternoon.  Past Medical History: Past Medical History:  Diagnosis Date  . BPH (benign prostatic hyperplasia)   . Diabetes mellitus without complication (HCC)   . GERD (gastroesophageal reflux disease)   . HLD (hyperlipidemia)   . Hypertension      Past Surgical History: Past Surgical History:  Procedure Laterality Date  . HIP FRACTURE SURGERY    . KNEE SURGERY      Home Medications: Prior to Admission medications   Medication Sig Start Date End Date Taking? Authorizing Provider  atorvastatin (LIPITOR) 20 MG tablet Take 20 mg by mouth daily.   Yes [provider]  finasteride (PROSCAR) 5 MG tablet Take 1 tablet (5 mg total) by mouth daily. 02/15/17  Yes McGowan, Carollee Herter A, PA-C  furosemide (LASIX) 20 MG tablet Take 20 mg by mouth daily.   Yes [provider]  latanoprost (XALATAN) 0.005 % ophthalmic solution Place 1 drop into both eyes  at bedtime.   Yes [provider]  potassium chloride (K-DUR,KLOR-CON) 10 MEQ tablet Take 10 mEq by mouth daily.   Yes [provider]  sodium chloride 1 g tablet Take 1 tablet (1 g total) by mouth 2 (two) times daily with a meal. 04/07/17  Yes Enid Baas, MD  feeding supplement, ENSURE ENLIVE, (ENSURE ENLIVE) LIQD Take 237 mLs by mouth 2 (two) times daily between meals. Patient not taking: Reported on 04/28/2017 04/07/17   Enid Baas, MD  lisinopril (PRINIVIL,ZESTRIL) 10 MG tablet Take 1 tablet (10 mg total) by mouth daily. 03/20/17 04/19/17  Milagros Loll, MD  tamsulosin (FLOMAX) 0.4 MG CAPS capsule Take 1 capsule (0.4 mg total) by mouth daily. Patient not taking: Reported on 04/28/2017 02/15/17   Michiel Cowboy A, PA-C    Allergies: No Known Allergies  Social History:  reports that  has never smoked. he has never used smokeless tobacco. He reports that he does not drink alcohol or use drugs.   Family History: Family History  Problem Relation Age of Onset  . Diabetes Father   . Hypertension Father   . Prostate cancer Father     Review of Systems: Review of Systems  Constitutional: Positive for malaise/fatigue. Negative for chills and fever.  HENT: Negative for hearing loss.   Respiratory: Negative for shortness of breath.   Cardiovascular: Negative for chest pain.  Gastrointestinal: Positive for nausea and vomiting. Negative for abdominal pain and blood in stool.  Genitourinary: Negative for  dysuria.  Musculoskeletal: Negative for myalgias.  Skin: Negative for rash.  Neurological: Positive for weakness. Negative for dizziness.  Psychiatric/Behavioral: Negative for depression.  All other systems reviewed and are negative.   Physical Exam BP 118/78   Pulse 94   Temp 97.7 F (36.5 C) (Oral)   Resp 18   Ht 5\' 10"  (1.778 m)   Wt 54.2 kg (119 lb 8 oz)   SpO2 96%   BMI 17.15 kg/m  CONSTITUTIONAL: No acute distress HEENT:  Normocephalic,  atraumatic, extraocular motion intact. NECK: Trachea is midline, and there is no jugular venous distension. RESPIRATORY:  Lungs are clear, and breath sounds are equal bilaterally. Normal respiratory effort without pathologic use of accessory muscles. CARDIOVASCULAR: Heart is regular without murmurs, gallops, or rubs. GI: The abdomen is soft, distended, non-tender to palpation.  GU: foley catheter in place MUSCULOSKELETAL:  Normal muscle strength and tone in all four extremities.  No peripheral edema or cyanosis. SKIN: Skin turgor is normal. There are no pathologic skin lesions.  NEUROLOGIC:  Motor and sensation is grossly normal.  Cranial nerves are grossly intact. PSYCH:  Alert and oriented to person, place and time. Affect is normal.  Laboratory Analysis: Results for orders placed or performed during the hospital encounter of 04/28/17 (from the past 24 hour(s))  Glucose, capillary     Status: Abnormal   Collection Time: 04/30/17  4:30 PM  Result Value Ref Range   Glucose-Capillary 156 (H) 65 - 99 mg/dL  Glucose, capillary     Status: Abnormal   Collection Time: 04/30/17  5:58 PM  Result Value Ref Range   Glucose-Capillary 155 (H) 65 - 99 mg/dL  Sodium     Status: Abnormal   Collection Time: 04/30/17  9:08 PM  Result Value Ref Range   Sodium 120 (L) 135 - 145 mmol/L  Glucose, capillary     Status: Abnormal   Collection Time: 04/30/17  9:24 PM  Result Value Ref Range   Glucose-Capillary 143 (H) 65 - 99 mg/dL  Sodium     Status: Abnormal   Collection Time: 05/01/17  4:42 AM  Result Value Ref Range   Sodium 122 (L) 135 - 145 mmol/L  Magnesium     Status: None   Collection Time: 05/01/17  4:42 AM  Result Value Ref Range   Magnesium 2.4 1.7 - 2.4 mg/dL  Glucose, capillary     Status: Abnormal   Collection Time: 05/01/17  7:33 AM  Result Value Ref Range   Glucose-Capillary 185 (H) 65 - 99 mg/dL  Glucose, capillary     Status: Abnormal   Collection Time: 05/01/17 12:03 PM  Result  Value Ref Range   Glucose-Capillary 182 (H) 65 - 99 mg/dL  Sodium     Status: Abnormal   Collection Time: 05/01/17 12:44 PM  Result Value Ref Range   Sodium 123 (L) 135 - 145 mmol/L  CBC with Differential/Platelet     Status: Abnormal   Collection Time: 05/01/17 12:44 PM  Result Value Ref Range   WBC 20.6 (H) 3.8 - 10.6 K/uL   RBC 4.31 (L) 4.40 - 5.90 MIL/uL   Hemoglobin 13.2 13.0 - 18.0 g/dL   HCT 53.638.3 (L) 64.440.0 - 03.452.0 %   MCV 88.9 80.0 - 100.0 fL   MCH 30.7 26.0 - 34.0 pg   MCHC 34.5 32.0 - 36.0 g/dL   RDW 74.214.4 59.511.5 - 63.814.5 %   Platelets 330 150 - 440 K/uL   Neutrophils Relative % 95 %  Neutro Abs 19.6 (H) 1.4 - 6.5 K/uL   Lymphocytes Relative 1 %   Lymphs Abs 0.2 (L) 1.0 - 3.6 K/uL   Monocytes Relative 4 %   Monocytes Absolute 0.8 0.2 - 1.0 K/uL   Eosinophils Relative 0 %   Eosinophils Absolute 0.0 0 - 0.7 K/uL   Basophils Relative 0 %   Basophils Absolute 0.0 0 - 0.1 K/uL  Lactic acid, plasma     Status: Abnormal   Collection Time: 05/01/17 12:44 PM  Result Value Ref Range   Lactic Acid, Venous 3.3 (HH) 0.5 - 1.9 mmol/L    Imaging: Dg Abd 2 Views  Result Date: 05/01/2017 CLINICAL DATA:  Vomiting, extreme abdominal distension EXAM: ABDOMEN - 2 VIEW COMPARISON:  CT abdomen/pelvis dated 04/29/2017 FINDINGS: Gaseous distension of small and large bowel. Cecum is mildly distended in the right lower quadrant. Moderate rectal stool burden. Overall, this appearance is nonspecific, but favors adynamic ileus. Cecal pneumatosis on CT is not radiographically evident. No evidence of free air under the diaphragm on the upright view. Degenerative changes of the visualized thoracolumbar spine. 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 IMPRESSION: Gaseous distention of small and large bowel, as described above, favoring adynamic ileus. Cecal pneumatosis on CT is not radiographically evident. No free air. Electronically Signed   By:  Charline Bills M.D.   On: 05/01/2017 10:05    Assessment and Plan: This is a 84 y.o. male who presents with abdominal distention, nausea, and vomiting.  I have independently viewed the patient's imaging studies and reviewed his laboratory studies.  His initial CT scan did have pneumatosis in the cecum and ascites, but had borderline normal WBC at the time without any abdominal symptoms.  Now his WBC is elevated at 20.6 with lactic acidosis of 3.3 with abdominal distention, nausea, and vomiting.  There's no free air on his KUB but rather diffuse small and large bowel distention.  On exam, the patient is not peritoneal and without any abdominal pain on deep palpation.  His bowel movement was soft and non-bloody.  Less likely to be true bowel ischemia or necrotic bowel causing his elevated WBC and lactic acid.  He did have multiple episodes of emesis and could be dehydrated, and his abx were recently changed based on cultures.  His ileus could be from the ascites and also from his infection with his UTI.  At this point don't think there's an acute surgical need to rush him to the OR.  He's also not a good surgical candidate given his comorbidities and frailty.  Given his ileus, nausea, and vomiting, would recommend placing NG tube for decompression and continuing NPO status.  Agree with IV fluid hydration and bolus as given and repeat lactic acid.  Will continue following with you.   Howie Ill, MD [facility]

## 2017-05-01 NOTE — Progress Notes (Signed)
Calorie Count Note  48 hour calorie count ordered. Day 1 results below. Calorie count results only collected for 24 hours due to pt NPO.   Diet Order--  DIET - DYS 1 Room service appropriate? Yes with Assist; Fluid consistency: Nectar Thick Aspiration precautions  Supplements -- Ensure Enlive BID, Magic Cup TID, MVI  Lunch-- 0% meal eaten/0kcals Dinner-- 0% meal eaten/0kcals Breakfast-- 0% meal eaten/0kcals Supplements Ensure Enlive x 1 (350 kcal, 20 grams protein)  Total Intake:  350 kcal (20-24% minimum estimated needs) 20 protein (24-27% of minimum estimated needs)  Estimated Nutritional Needs:  Kcal:  1470-1715 (MSJ x 1.2-1.4)  Protein:  75-85 grams (1.4-1.6 grams/kg)  Fluid:  1.3 L/day (25 mL/kg)   Nutrition Dx: Severe Malnutrition related to social / environmental circumstances(poor appetite, lived alone PTA, difficulty preparing meals, advanced age) as evidenced by severe fat depletion, severe muscle depletion.   Intervention:  Will discontinue calorie count for right now as pt is NPO.   RD will continue to follow pt

## 2017-05-01 NOTE — Progress Notes (Signed)
No charge note:   Palliative consult received-   Spoke with patient's granddaughter Matt HolmesBeth McGuire- GOC meeting arranged for tomorrow 2/26 at 3pm.  Ocie BobKasie Jenie Parish, AGNP-C Palliative Medicine  Please call Palliative Medicine team phone with any questions [phone]. For individual providers please see AMION.

## 2017-05-01 NOTE — Progress Notes (Signed)
SLP Cancellation Note  Patient Details Name: Christian LarocheClarence J Scorza MRN: [id number] DOB: 07/02/1926   Cancelled treatment:       Reason Eval/Treat Not Completed: Patient not medically ready;Medical issues which prohibited therapy(chart reviewed; pt is NPO d/t ileus per MD. ST will f/u next 1-2 days when pt is able to take po's again.) Recommend frequent oral care for hygiene and stimulation while NPO.   Jerilynn SomKatherine Watson, MS, CCC-SLP Watson,Katherine 05/01/2017, 11:11 AM

## 2017-05-01 NOTE — Progress Notes (Signed)
Elevated lactic acid and WBC.  Patient is on IV Zosyn.  We will bolus normal saline stat.  Repeat lactic acid.

## 2017-05-02 DIAGNOSIS — N401 Enlarged prostate with lower urinary tract symptoms: Secondary | ICD-10-CM

## 2017-05-02 DIAGNOSIS — N138 Other obstructive and reflux uropathy: Secondary | ICD-10-CM

## 2017-05-02 DIAGNOSIS — F0390 Unspecified dementia without behavioral disturbance: Secondary | ICD-10-CM

## 2017-05-02 DIAGNOSIS — E43 Unspecified severe protein-calorie malnutrition: Secondary | ICD-10-CM

## 2017-05-02 DIAGNOSIS — Z7189 Other specified counseling: Secondary | ICD-10-CM

## 2017-05-02 DIAGNOSIS — N390 Urinary tract infection, site not specified: Secondary | ICD-10-CM

## 2017-05-02 DIAGNOSIS — L8990 Pressure ulcer of unspecified site, unspecified stage: Secondary | ICD-10-CM

## 2017-05-02 LAB — PROTEIN ELECTRO, RANDOM URINE
ALPHA-1-GLOBULIN, U: 4.2 %
Albumin ELP, Urine: 24.8 %
Alpha-2-Globulin, U: 9.2 %
Beta Globulin, U: 33.7 %
Gamma Globulin, U: 28.1 %
TOTAL PROTEIN, URINE-UPE24: 24.7 mg/dL

## 2017-05-02 LAB — PROTEIN ELECTROPHORESIS, SERUM
A/G RATIO SPE: 1.5 (ref 0.7–1.7)
ALBUMIN ELP: 2.8 g/dL — AB (ref 2.9–4.4)
Alpha-1-Globulin: 0.2 g/dL (ref 0.0–0.4)
Alpha-2-Globulin: 0.6 g/dL (ref 0.4–1.0)
BETA GLOBULIN: 0.6 g/dL — AB (ref 0.7–1.3)
Gamma Globulin: 0.6 g/dL (ref 0.4–1.8)
Globulin, Total: 1.9 g/dL — ABNORMAL LOW (ref 2.2–3.9)
TOTAL PROTEIN ELP: 4.7 g/dL — AB (ref 6.0–8.5)

## 2017-05-02 LAB — CBC
HEMATOCRIT: 34.8 % — AB (ref 40.0–52.0)
Hemoglobin: 11.7 g/dL — ABNORMAL LOW (ref 13.0–18.0)
MCH: 30.7 pg (ref 26.0–34.0)
MCHC: 33.8 g/dL (ref 32.0–36.0)
MCV: 91 fL (ref 80.0–100.0)
PLATELETS: 260 10*3/uL (ref 150–440)
RBC: 3.82 MIL/uL — AB (ref 4.40–5.90)
RDW: 14.3 % (ref 11.5–14.5)
WBC: 17.5 10*3/uL — ABNORMAL HIGH (ref 3.8–10.6)

## 2017-05-02 LAB — SODIUM: Sodium: 126 mmol/L — ABNORMAL LOW (ref 135–145)

## 2017-05-02 LAB — BASIC METABOLIC PANEL
Anion gap: 10 (ref 5–15)
BUN: 28 mg/dL — AB (ref 6–20)
CHLORIDE: 97 mmol/L — AB (ref 101–111)
CO2: 20 mmol/L — ABNORMAL LOW (ref 22–32)
Calcium: 7.7 mg/dL — ABNORMAL LOW (ref 8.9–10.3)
Creatinine, Ser: 0.89 mg/dL (ref 0.61–1.24)
GFR calc Af Amer: >60 mL/min (ref >60)
GLUCOSE: 108 mg/dL — AB (ref 65–99)
POTASSIUM: 4.2 mmol/L (ref 3.5–5.1)
Sodium: 127 mmol/L — ABNORMAL LOW (ref 135–145)

## 2017-05-02 LAB — GLUCOSE, CAPILLARY
GLUCOSE-CAPILLARY: 127 mg/dL — AB (ref 65–99)
Glucose-Capillary: 127 mg/dL — ABNORMAL HIGH (ref 65–99)
Glucose-Capillary: 104 mg/dL — ABNORMAL HIGH (ref 65–99)
Glucose-Capillary: 146 mg/dL — ABNORMAL HIGH (ref 65–99)

## 2017-05-02 LAB — ALBUMIN: ALBUMIN: 2.3 g/dL — AB (ref 3.5–5.0)

## 2017-05-02 MED ORDER — HYDROCHLOROTHIAZIDE 12.5 MG PO CAPS: 12.5000 mg | ORAL_CAPSULE | Freq: Every day | ORAL | Status: DC

## 2017-05-02 MED ORDER — LEVOFLOXACIN 500 MG PO TABS
500.0000 mg | ORAL_TABLET | Freq: Once | ORAL | Status: AC
  Administered 2017-05-02: 500 mg via ORAL
  Filled 2017-05-02: qty 1

## 2017-05-02 MED ORDER — POLYETHYLENE GLYCOL 3350 17 G PO PACK
17.0000 g | PACK | Freq: Two times a day (BID) | ORAL | Status: DC
  Administered 2017-05-02: 18:00:00 17 g via ORAL
  Filled 2017-05-02 (×2): qty 1

## 2017-05-02 MED ORDER — LEVOFLOXACIN 250 MG PO TABS
250.0000 mg | ORAL_TABLET | Freq: Every day | ORAL | Status: DC
  Administered 2017-05-03: 250 mg via ORAL
  Filled 2017-05-02: qty 1

## 2017-05-02 MED ORDER — HYDRALAZINE HCL 20 MG/ML IJ SOLN: 10.0000 mg | Freq: Four times a day (QID) | INTRAMUSCULAR | Status: DC | PRN

## 2017-05-02 NOTE — Progress Notes (Signed)
[facility]  ROUNDING NOTE   Subjective:   Na 127  Ileus resolved. Patient states he is starting to feel better.   Objective:  Vital signs in last 24 hours:  Temp:  [97.7 F (36.5 C)-98.2 F (36.8 C)] 98.2 F (36.8 C) (02/26 1144) Pulse Rate:  [88-99] 99 (02/26 1144) Resp:  [18] 18 (02/26 1144) BP: (113-125)/(56-66) 113/56 (02/26 1144) SpO2:  [94 %-99 %] 94 % (02/26 1144)  Weight change:  Filed Weights   04/28/17 2130 04/29/17 0241  Weight: 56.7 kg (125 lb) 54.2 kg (119 lb 8 oz)    Intake/Output: I/O last 3 completed shifts: In: 1235 [I.V.:1135; IV Piggyback:100] Out: 250 [Urine:250]   Intake/Output this shift:  No intake/output data recorded.  Physical Exam: General: cachectic  Head: Normocephalic, atraumatic. Moist oral mucosal membranes  Eyes: Anicteric, PERRL  Neck: Supple, trachea midline  Lungs:  Clear to auscultation  Heart: Regular rate and rhythm  Abdomen:  Soft, nontender,   Extremities: + peripheral edema.  Neurologic: Nonfocal, moving all four extremities  Skin: No lesions       Basic Metabolic Panel: Recent Labs  Lab 04/28/17 2151 04/29/17 0613 04/29/17 1209  04/30/17 0721  05/01/17 0442 05/01/17 1244 05/01/17 2046 05/02/17 0434 05/02/17 1239  NA 113* 114*  --    < > 117*   < > 122* 123* 123* 127* 126*  K 2.8* 2.8*  --   --  3.2*  --   --   --   --  4.2  --   CL 78* 83*  --   --  88*  --   --   --   --  97*  --   CO2 23 18*  --   --  21*  --   --   --   --  20*  --   GLUCOSE 182* 132*  --   --  144*  --   --   --   --  108*  --   BUN 24* 21*  --   --  22*  --   --   --   --  28*  --   CREATININE 1.01 0.72  --   --  0.80  --   --   --   --  0.89  --   CALCIUM 7.8* 7.2*  --   --  7.4*  --   --   --   --  7.7*  --   MG  --   --  1.0*  --   --   --  2.4  --   --   --   --    < > = values in this interval not displayed.    Liver Function Tests: Recent Labs  Lab 04/28/17 2151  AST 61*  ALT 37  ALKPHOS 65  BILITOT 1.8*   PROT 5.8*  ALBUMIN 3.2*   No results for input(s): LIPASE, AMYLASE in the last 168 hours. No results for input(s): AMMONIA in the last 168 hours.  CBC: Recent Labs  Lab 04/28/17 2151 04/29/17 0613 05/01/17 1244 05/02/17 0434  WBC 10.6 11.8* 20.6* 17.5*  NEUTROABS  --   --  19.6*  --   HGB 12.4* 12.0* 13.2 11.7*  HCT 34.5* 34.5* 38.3* 34.8*  MCV 86.6 87.5 88.9 91.0  PLT 367 308 330 260    Cardiac Enzymes: No results for input(s): CKTOTAL, CKMB, CKMBINDEX, TROPONINI in the last 168 hours.  BNP: Invalid  input(s): POCBNP  CBG: Recent Labs  Lab 05/01/17 1203 05/01/17 1656 05/02/17 0001 05/02/17 0748 05/02/17 1140  GLUCAP 182* 153* 110* 127* 104*    Microbiology: Results for orders placed or performed during the hospital encounter of 04/28/17  Urine Culture     Status: Abnormal   Collection Time: 04/29/17  1:03 AM  Result Value Ref Range Status   Specimen Description   Final    URINE, RANDOM Performed at [facility], [address] [place], [place] [address]    Special Requests   Final    NONE Performed at [facility], [address] [place], [place] [address]    Culture (A)  Final    >=100,000 COLONIES/mL STENOTROPHOMONAS MALTOPHILIA >=100,000 COLONIES/mL ENTEROCOCCUS FAECALIS    Report Status 05/01/2017 FINAL  Final   Organism ID, Bacteria ENTEROCOCCUS FAECALIS (A)  Final   Organism ID, Bacteria STENOTROPHOMONAS MALTOPHILIA (A)  Final      Susceptibility   Enterococcus faecalis - MIC*    AMPICILLIN <=2 SENSITIVE Sensitive     LEVOFLOXACIN 0.5 SENSITIVE Sensitive     NITROFURANTOIN <=16 SENSITIVE Sensitive     VANCOMYCIN 1 SENSITIVE Sensitive     * >=100,000 COLONIES/mL ENTEROCOCCUS FAECALIS   Stenotrophomonas maltophilia - MIC*    LEVOFLOXACIN 2 SENSITIVE Sensitive     TRIMETH/SULFA <=20 SENSITIVE Sensitive     * >=100,000 COLONIES/mL STENOTROPHOMONAS MALTOPHILIA    Coagulation Studies: No results for input(s): LABPROT, INR  in the last 72 hours.  Urinalysis: No results for input(s): COLORURINE, LABSPEC, PHURINE, GLUCOSEU, HGBUR, BILIRUBINUR, KETONESUR, PROTEINUR, UROBILINOGEN, NITRITE, LEUKOCYTESUR in the last 72 hours.  Invalid input(s): APPERANCEUR    Imaging: Dg Abd 2 Views  Result Date: 05/01/2017 CLINICAL DATA:  Vomiting, extreme abdominal distension EXAM: ABDOMEN - 2 VIEW COMPARISON:  CT abdomen/pelvis dated 04/29/2017 FINDINGS: Gaseous distension of small and large bowel. Cecum is mildly distended in the right lower quadrant. Moderate rectal stool burden. Overall, this appearance is nonspecific, but favors adynamic ileus. Cecal pneumatosis on CT is not radiographically evident. No evidence of free air under the diaphragm on the upright view. Degenerative changes of the visualized thoracolumbar spine. 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 IMPRESSION: Gaseous distention of small and large bowel, as described above, favoring adynamic ileus. Cecal pneumatosis on CT is not radiographically evident. No free air. Electronically Signed   By: Charline Bills M.D.   On: 05/01/2017 10:05     Medications:   . 0.9 % NaCl with KCl 20 mEq / L 50 mL/hr at 05/01/17 2356  . piperacillin-tazobactam (ZOSYN)  IV 3.375 g (05/02/17 1429)   . atorvastatin  20 mg Oral Daily  . bisacodyl  5 mg Oral Daily  . docusate sodium  100 mg Oral BID  . enoxaparin (LOVENOX) injection  40 mg Subcutaneous Q24H  . feeding supplement (ENSURE ENLIVE)  237 mL Oral BID BM  . finasteride  5 mg Oral Daily  . insulin aspart  0-5 Units Subcutaneous QHS  . insulin aspart  0-9 Units Subcutaneous TID WC  . latanoprost  1 drop Both Eyes QHS  . sodium chloride  1 g Oral BID   acetaminophen **OR** acetaminophen, alum & mag hydroxide-simeth, ondansetron **OR** ondansetron (ZOFRAN) IV  Assessment/ Plan:  [name] is a 84 y.o.  male with diabetes, BPH, GERD,  hyperlipidemia, hypertension presents with weakness and worsening leg edema and is found to have severe hyponatremia.  Hospital stay is complicated by left femoral nonocclusive DVT   1. hyponatremia and hypokalemia: secondary to congestive heart failure.  - Continue IV fluids NS at 80mL/hr  - Continue salt tabs   LOS: 3 [name] 2/26/20193:04 PM

## 2017-05-02 NOTE — Progress Notes (Signed)
Per pharmacy (pharmacist stephanie) finish zosyn hanging prior to order discontinuance.

## 2017-05-02 NOTE — Progress Notes (Signed)
[employer] [facility] [place] [facility]   PATIENT NAME: [name]    MR#:  [id number]  DATE OF BIRTH:  01-Jul-1926  SUBJECTIVE:  CHIEF COMPLAINT:   Chief Complaint  Patient presents with  . Weakness   Complains of abdominal pain.  One episode of vomiting earlier today.  REVIEW OF SYSTEMS:    Review of Systems  Constitutional: Positive for malaise/fatigue and weight loss. Negative for chills and fever.  HENT: Negative for sore throat.   Eyes: Negative for blurred vision, double vision and pain.  Respiratory: Negative for cough, hemoptysis, shortness of breath and wheezing.   Cardiovascular: Negative for chest pain, palpitations, orthopnea and leg swelling.  Gastrointestinal: Positive for abdominal pain and nausea. Negative for constipation, diarrhea, heartburn and vomiting.  Genitourinary: Negative for dysuria and hematuria.  Musculoskeletal: Negative for back pain and joint pain.  Skin: Negative for rash.  Neurological: Positive for weakness. Negative for sensory change, speech change, focal weakness and headaches.  Endo/Heme/Allergies: Does not bruise/bleed easily.  Psychiatric/Behavioral: Negative for depression. The patient is not nervous/anxious.     DRUG ALLERGIES:  No Known Allergies  VITALS:  Blood pressure (!) 113/56, pulse 99, temperature 98.2 F (36.8 C), resp. rate 18, height 5\' 10"  (1.778 m), weight 54.2 kg (119 lb 8 oz), SpO2 94 %.  PHYSICAL EXAMINATION:   Physical Exam  GENERAL:  84 y.o.-year-old patient lying in the bed with no acute distress.  EYES: Pupils equal, round, reactive to light and accommodation. No scleral icterus. Extraocular muscles intact.  HEENT: Head atraumatic, normocephalic. Oropharynx and nasopharynx clear.  NECK:  Supple, no jugular venous distention. No thyroid enlargement, no tenderness.  LUNGS: Normal breath sounds bilaterally, no wheezing, rales, rhonchi. No use of accessory muscles of respiration.   CARDIOVASCULAR: S1, S2 normal. No murmurs, rubs, or gallops.  ABDOMEN: Soft, diffuse tenderness, nondistended. Bowel sounds decreased. No organomegaly or mass.  EXTREMITIES: No cyanosis, clubbing or edema b/l.    NEUROLOGIC: Cranial nerves II through XII are intact. No focal Motor or sensory deficits b/l.   PSYCHIATRIC: The patient is alert and oriented x 3.  SKIN: No obvious rash, lesion, or ulcer.   LABORATORY PANEL:   CBC Recent Labs  Lab 05/02/17 0434  WBC 17.5*  HGB 11.7*  HCT 34.8*  PLT 260   ------------------------------------------------------------------------------------------------------------------ Chemistries  Recent Labs  Lab 04/28/17 2151  05/01/17 0442  05/02/17 0434 05/02/17 1239  NA 113*   < > 122*   < > 127* 126*  K 2.8*   < >  --   --  4.2  --   CL 78*   < >  --   --  97*  --   CO2 23   < >  --   --  20*  --   GLUCOSE 182*   < >  --   --  108*  --   BUN 24*   < >  --   --  28*  --   CREATININE 1.01   < >  --   --  0.89  --   CALCIUM 7.8*   < >  --   --  7.7*  --   MG  --    < > 2.4  --   --   --   AST 61*  --   --   --   --   --   ALT 37  --   --   --   --   --  ALKPHOS 65  --   --   --   --   --   BILITOT 1.8*  --   --   --   --   --    < > = values in this interval not displayed.   ------------------------------------------------------------------------------------------------------------------  Cardiac Enzymes No results for input(s): TROPONINI in the last 168 hours. ------------------------------------------------------------------------------------------------------------------  RADIOLOGY:  Dg Abd 2 Views  Result Date: 05/01/2017 CLINICAL DATA:  Vomiting, extreme abdominal distension EXAM: ABDOMEN - 2 VIEW COMPARISON:  CT abdomen/pelvis dated 04/29/2017 FINDINGS: Gaseous distension of small and large bowel. Cecum is mildly distended in the right lower quadrant. Moderate rectal stool burden. Overall, this appearance is nonspecific, but  favors adynamic ileus. Cecal pneumatosis on CT is not radiographically evident. No evidence of free air under the diaphragm on the upright view. Degenerative changes of the visualized thoracolumbar spine. 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 IMPRESSION: Gaseous distention of small and large bowel, as described above, favoring adynamic ileus. Cecal pneumatosis on CT is not radiographically evident. No free air. Electronically Signed   By: Charline BillsSriyesh  Krishnan M.D.   On: 05/01/2017 10:05     ASSESSMENT AND PLAN:   * Ileus 2 bowel movements overnight.  No vomiting.  Normal bowel sounds on examination. Will start diet.  Add MiraLAX for constipation.  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 Slowly improving.   Appreciate nephrology help.   Sodium close to his baseline.  *  UTI (urinary tract infection), catheter related.  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  Blood pressure normal.  *Severe protein calorie malnutrition.  Significant weight loss.  CT scan abdomen and pelvis checked and showsed no malignancies. Calorie count.  All the records are reviewed and case discussed with Care Management/Social Worker Management plans discussed with the patient, family and they are in agreement.  CODE STATUS: DNR  DVT Prophylaxis: SCDs  TOTAL TIME TAKING CARE OF THIS PATIENT: 35 minutes.   POSSIBLE D/C IN 1-2 DAYS, DEPENDING ON CLINICAL CONDITION.  Molinda BailiffSrikar R Avaree Gilberti M.D on 05/02/2017 at 3:32 PM  Between 7am to 6pm - Pager - [phone]  After 6pm go to [email or web address] - password [email or web address] [facility]  [facility] [place] [facility]  Office   [phone]  CC: Primary care physician; [name], [name], MD  Note: This dictation was prepared with Dragon dictation along with smaller phrase technology. Any transcriptional errors that result from this process are unintentional.

## 2017-05-02 NOTE — Consult Note (Signed)
Consultation Note Date: 05/02/2017   Patient Name: [name]  DOB: [birth year]  MRN: [id number]  Age / Sex: 84 y.o., male  PCP: Kirk Ruths, MD Referring Physician: Hillary Bow, MD  Reason for Consultation: Establishing goals of care  HPI/Patient Profile: 84 y.o. male  with past medical history of  DM, GERD, HLD, HTN, BPH with indwelling foley cath admitted on 04/28/2017 with progressing weakness and leg edema. Workup reveals hyponatremia, severe protein calorie malnutrition, and UTI. This hospitalization was further complicated by an illeus which has now resolved. Patient refused NG tube. This is the third admission in the last 6 months for hyponatremia and UTI. Palliative medicine consulted for [place].     Clinical Assessment and Goals of Care:  I have reviewed medical records including EPIC notes, labs and imaging, assessed the patient and then met at the bedside along with patient's granddaughter and HCPOA- [name] to discuss diagnosis prognosis, GOC, EOL wishes, disposition and options.  Patient evaluated and was found to be severely cachetic. He was oriented but lethargic. Able to engage in limited [place] conversation, but lengthy and involved conversation exhausted him. He deferred conversation to his [place].   I introduced Palliative Medicine as specialized medical care for people living with serious illness. It focuses on providing relief from the symptoms and stress of a serious illness. The goal is to improve quality of life for both the patient and the family.  We discussed a brief life review of the patient. 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 He has a daughter who has not been involved in his care. His granddaughter- [name] has been his primary caretaker.   As far as functional and nutritional status - there has been  significant decline- especially since a hospitalization for a fall about six months ago. They have noted he has just stopped eating. He has lost significant amount of weight in the last year. Prior to this admission he had a walker he was using in the home, but he notes he was ambulating in his home less and less. Hasn't been leaving his home. Has been getting assistance from home care. Cannot verbalize what brings him joy. There has been changes in his cognitive status- family has noted slowed thinking processes, sleeping more.     We discussed their current illness and what it means in the larger context of their on-going co-morbidities.  Natural disease trajectory and expectations at EOL were discussed. Family notes that he has been "preparing" for EOL. We discussed chronic UTI's, decrease in functional, cognitive and nutritional status indicate he is progressing toward end of life. Family agrees. Their [place] would be for him to return home with him and live time that he has as best possible with comfort and dignity.   The difference between aggressive medical intervention and comfort care was considered in light of the patient's goals of care.   Advanced directives, concepts specific to code status, artifical feeding  and hydration, and rehospitalization were considered and discussed. Patient is DNR status prior to this consult. He would not want artificial feeding. Rehospitalization does not align with GOC.  Hospice and Palliative Care services outpatient were explained and offered. They believe Hospice services would provide support to help keep patient in their home to live time of life best possible with comfort and quality. Returning to hospital and treating future possible septicemia or weakness from hyponatremia, and malnutrition would not align with their goals.   Questions and concerns were addressed. The family was encouraged to call with questions or concerns.   Primary Decision Maker HCPOA-  [name]    SUMMARY OF RECOMMENDATIONS -Finish course of treatment without escalating and discharge home with [place] referral- home with Hospice    Code Status/Advance Care Planning:  DNR  Palliative Prophylaxis:   Delirium Protocol   Prognosis:    < 6 months d/t severe protein malnutrition aeb severe cachexia with temporal muscle wasting, recurrent hyponatremia, significant changes in nutritional, cognitive and functional status, chronic foley with recurrent UTI's, desire to transition care to comfort at discharge  Discharge Planning: Home with Hospice  Primary Diagnoses: Present on Admission: . GERD (gastroesophageal reflux disease) . HLD (hyperlipidemia) . HTN (hypertension) . Hyponatremia . BPH with obstruction/lower urinary tract symptoms . UTI (urinary tract infection)   I have reviewed the medical record, interviewed the patient and family, and examined the patient. The following aspects are pertinent.  Past Medical History:  Diagnosis Date  . BPH (benign prostatic hyperplasia)   . Diabetes mellitus without complication ([place])   . GERD (gastroesophageal reflux disease)   . HLD (hyperlipidemia)   . Hypertension    Social History   Socioeconomic History  . Marital status: Widowed    Spouse name: None  . Number of children: None  . Years of education: None  . Highest education level: None  Social Needs  . Financial resource strain: None  . Food insecurity - worry: None  . Food insecurity - inability: None  . Transportation needs - medical: None  . Transportation needs - non-medical: None  Occupational History  . None  Tobacco Use  . Smoking status: Never Smoker  . Smokeless tobacco: Never Used  Substance and Sexual Activity  . Alcohol use: No  . Drug use: No  . Sexual activity: No  Other Topics Concern  . None  Social History Narrative  . None   Family History  Problem Relation Age of Onset  . Diabetes Father   .  Hypertension Father   . Prostate cancer Father    Scheduled Meds: . atorvastatin  20 mg Oral Daily  . docusate sodium  100 mg Oral BID  . enoxaparin (LOVENOX) injection  40 mg Subcutaneous Q24H  . feeding supplement (ENSURE ENLIVE)  237 mL Oral BID BM  . finasteride  5 mg Oral Daily  . insulin aspart  0-5 Units Subcutaneous QHS  . insulin aspart  0-9 Units Subcutaneous TID WC  . latanoprost  1 drop Both Eyes QHS  . levofloxacin  500 mg Oral Daily  . polyethylene glycol  17 g Oral BID  . sodium chloride  1 g Oral BID   Continuous Infusions: . 0.9 % NaCl with KCl 20 mEq / L 50 mL/hr at 05/01/17 2356   PRN Meds:.acetaminophen **OR** acetaminophen, alum & mag hydroxide-simeth, ondansetron **OR** ondansetron (ZOFRAN) IV Medications Prior to Admission:  Prior to Admission medications   Medication Sig Start Date End Date  Taking? Authorizing Provider  atorvastatin (LIPITOR) 20 MG tablet Take 20 mg by mouth daily.   Yes [provider]  finasteride (PROSCAR) 5 MG tablet Take 1 tablet (5 mg total) by mouth daily. 02/15/17  Yes McGowan, Larene Beach A, PA-C  furosemide (LASIX) 20 MG tablet Take 20 mg by mouth daily.   Yes [provider]  latanoprost (XALATAN) 0.005 % ophthalmic solution Place 1 drop into both eyes at bedtime.   Yes [provider]  potassium chloride (K-DUR,KLOR-CON) 10 MEQ tablet Take 10 mEq by mouth daily.   Yes [provider]  sodium chloride 1 g tablet Take 1 tablet (1 g total) by mouth 2 (two) times daily with a meal. 04/07/17  Yes Gladstone Lighter, MD  feeding supplement, ENSURE ENLIVE, (ENSURE ENLIVE) LIQD Take 237 mLs by mouth 2 (two) times daily between meals. Patient not taking: Reported on 04/28/2017 04/07/17   Gladstone Lighter, MD  lisinopril (PRINIVIL,ZESTRIL) 10 MG tablet Take 1 tablet (10 mg total) by mouth daily. 03/20/17 04/19/17  Hillary Bow, MD  tamsulosin (FLOMAX) 0.4 MG CAPS capsule Take 1 capsule (0.4 mg total) by mouth  daily. Patient not taking: Reported on 04/28/2017 02/15/17   Zara Council A, PA-C   No Known Allergies Review of Systems  Physical Exam  Constitutional:  cachetic  Cardiovascular: Normal rate and regular rhythm.  Pulmonary/Chest: Effort normal. He has wheezes.  Abdominal: Soft. Bowel sounds are normal.  Musculoskeletal:  Diffuse musle wasting  Skin: There is pallor.  Nursing note and vitals reviewed.   Vital Signs: BP (!) 113/56 (BP Location: Left Arm)   Pulse 99   Temp 98.2 F (36.8 C)   Resp 18   Ht _0  (1.778 m)   Wt 54.2 kg (119 lb 8 oz)   SpO2 94%   BMI 17.15 kg/m  Pain Assessment: No/denies pain   Pain Score: 0-No pain   SpO2: SpO2: 94 % O2 Device:SpO2: 94 % O2 Flow Rate: .   IO: Intake/output summary: No intake or output data in the 24 hours ending 05/02/17 1540  LBM: Last BM Date: 05/02/17 Baseline Weight: Weight: 56.7 kg (125 lb) Most recent weight: Weight: 54.2 kg (119 lb 8 oz)     Palliative Assessment/Data: PPS: 10%     Thank you for this consult. Palliative medicine will continue to follow and assist as needed.   Time In: 1445 Time Out: 1600 Time Total: 75 mins Greater than 50%  of this time was spent counseling and coordinating care related to the above assessment and plan.  Signed by: Mariana Kaufman, AGNP-C Palliative Medicine    Please contact Palliative Medicine Team phone at [phone] for questions and concerns.  For individual provider: See Shea Evans

## 2017-05-02 NOTE — Progress Notes (Signed)
05/02/2017  Subjective: Patient had a bowel movement last night and this morning as well.  No further emesis.  Abdomen no longer distended  Vital signs: Temp:  [97.7 F (36.5 C)-98.2 F (36.8 C)] 98.2 F (36.8 C) (02/26 1144) Pulse Rate:  [88-99] 99 (02/26 1144) Resp:  [18] 18 (02/26 1144) BP: (113-125)/(56-66) 113/56 (02/26 1144) SpO2:  [94 %-99 %] 94 % (02/26 1144)   Intake/Output: No intake/output data recorded. Last BM Date: 05/02/17  Physical Exam: Constitutional: No acute distress Abdomen: soft, nondistended, nontender to palpation.  Labs:  Recent Labs    05/01/17 1244 05/02/17 0434  WBC 20.6* 17.5*  HGB 13.2 11.7*  HCT 38.3* 34.8*  PLT 330 260   Recent Labs    04/30/17 0721  05/02/17 0434 05/02/17 1239  NA 117*   < > 127* 126*  K 3.2*  --  4.2  --   CL 88*  --  97*  --   CO2 21*  --  20*  --   GLUCOSE 144*  --  108*  --   BUN 22*  --  28*  --   CREATININE 0.80  --  0.89  --   CALCIUM 7.4*  --  7.7*  --    < > = values in this interval not displayed.   No results for input(s): LABPROT, INR in the last 72 hours.  Imaging: No results found.  Assessment/Plan: 84 yo male with abdominal distention and emesis  --Patient's symptoms seem to have resolved.  Unclear if this was ileus or the exact etiology.  He is afebrile, without pain, and he has been given a dysphagia diet this morning.   --No acute surgical needs.  Will sign off for now.  Please feel free to call or consult with any questions or concerns.   Howie IllJose Luis Vegas Fritze, MD [facility] ASCOM 7a-7p: [phone] ASCOM 7p-7a: [phone]

## 2017-05-02 NOTE — Progress Notes (Signed)
[facility] inadvertently ordered earlier) to NG tube. However, will hold on placement for NG as the patient has had no further episodes of vomiting, and CT abdomen shows no indication of ileus or obstruction.

## 2017-05-03 ENCOUNTER — Encounter: Payer: Self-pay | Admitting: Internal Medicine

## 2017-05-03 DIAGNOSIS — Z515 Encounter for palliative care: Secondary | ICD-10-CM

## 2017-05-03 LAB — BASIC METABOLIC PANEL
ANION GAP: 7 (ref 5–15)
BUN: 37 mg/dL — ABNORMAL HIGH (ref 6–20)
CO2: 22 mmol/L (ref 22–32)
Calcium: 7.7 mg/dL — ABNORMAL LOW (ref 8.9–10.3)
Chloride: 99 mmol/L — ABNORMAL LOW (ref 101–111)
Creatinine, Ser: 0.92 mg/dL (ref 0.61–1.24)
GFR calc Af Amer: >60 mL/min (ref >60)
Glucose, Bld: 173 mg/dL — ABNORMAL HIGH (ref 65–99)
POTASSIUM: 4.6 mmol/L (ref 3.5–5.1)
Sodium: 128 mmol/L — ABNORMAL LOW (ref 135–145)

## 2017-05-03 LAB — CBC WITH DIFFERENTIAL/PLATELET
BASOS ABS: 0 10*3/uL (ref 0–0.1)
Basophils Relative: 0 %
Eosinophils Absolute: 0 10*3/uL (ref 0–0.7)
Eosinophils Relative: 0 %
HEMATOCRIT: 35.4 % — AB (ref 40.0–52.0)
HEMOGLOBIN: 11.8 g/dL — AB (ref 13.0–18.0)
Lymphocytes Relative: 2 %
Lymphs Abs: 0.2 10*3/uL — ABNORMAL LOW (ref 1.0–3.6)
MCH: 30.1 pg (ref 26.0–34.0)
MCHC: 33.3 g/dL (ref 32.0–36.0)
MCV: 90.4 fL (ref 80.0–100.0)
Monocytes Absolute: 0.5 10*3/uL (ref 0.2–1.0)
Monocytes Relative: 4 %
NEUTROS ABS: 14.1 10*3/uL — AB (ref 1.4–6.5)
Neutrophils Relative %: 94 %
Platelets: 244 10*3/uL (ref 150–440)
RBC: 3.92 MIL/uL — AB (ref 4.40–5.90)
RDW: 14.7 % — ABNORMAL HIGH (ref 11.5–14.5)
WBC: 14.9 10*3/uL — AB (ref 3.8–10.6)

## 2017-05-03 LAB — GLUCOSE, CAPILLARY
GLUCOSE-CAPILLARY: 182 mg/dL — AB (ref 65–99)
Glucose-Capillary: 164 mg/dL — ABNORMAL HIGH (ref 65–99)

## 2017-05-03 MED ORDER — APIXABAN 5 MG PO TABS: 10.0000 mg | ORAL_TABLET | Freq: Two times a day (BID) | ORAL | Status: DC

## 2017-05-03 MED ORDER — APIXABAN 5 MG PO TABS
10.0000 mg | ORAL_TABLET | Freq: Two times a day (BID) | ORAL | Status: DC
  Filled 2017-05-03: qty 2

## 2017-05-03 MED ORDER — ELIQUIS 5 MG VTE STARTER PACK: ORAL_TABLET | ORAL | 0 refills | Status: AC

## 2017-05-03 MED ORDER — LEVOFLOXACIN 500 MG PO TABS: 500.0000 mg | ORAL_TABLET | Freq: Every day | ORAL | 0 refills | Status: AC

## 2017-05-03 NOTE — Progress Notes (Signed)
Daily Progress Note   Patient Name: [name]       Date: 05/03/2017 DOB: 06/07/1926  Age: 84 y.o. MRN#: [id number] Attending Physician: [name], Srikar, MD Primary Care Physician: [name], Christian W, MD Admit Date: 04/28/2017  Reason for Consultation/Follow-up: Establishing goals of care  Subjective: Patient requesting to drink water. Has been recommended nectar thick by SLP. Discussed comfort feeding at EOL and accepting aspiration risk with HCPOA- [name]. Per Christian SandyBeth- ok to give patient water to increase comfort and quality of life. Stated she planned to give patient whatever food and drink he requests when she takes him home.  She is eager to discuss discharge planning with case manager- let her know that referral to CM had been made. Gave patient water- he was able to drink without complication.   ROS  Length of Stay: 4  Current Medications: Scheduled Meds:  . apixaban  10 mg Oral BID  . atorvastatin  20 mg Oral Daily  . docusate sodium  100 mg Oral BID  . feeding supplement (ENSURE ENLIVE)  237 mL Oral BID BM  . finasteride  5 mg Oral Daily  . insulin aspart  0-5 Units Subcutaneous QHS  . insulin aspart  0-9 Units Subcutaneous TID WC  . latanoprost  1 drop Both Eyes QHS  . levofloxacin  250 mg Oral Daily  . polyethylene glycol  17 g Oral BID  . sodium chloride  1 g Oral BID    Continuous Infusions: . 0.9 % NaCl with KCl 20 mEq / L 50 mL/hr at 05/02/17 1928    PRN Meds: acetaminophen **OR** acetaminophen, alum & mag hydroxide-simeth, ondansetron **OR** ondansetron (ZOFRAN) IV  Physical Exam  Constitutional:  cachetic  Cardiovascular: Normal rate.  Pulmonary/Chest: Effort normal.  Neurological: He is alert.  Skin: There is pallor.  Nursing note and vitals  reviewed.           Vital Signs: BP (!) 134/93 (BP Location: Left Arm)   Pulse 100   Temp 97.6 F (36.4 C) (Axillary)   Resp 16   Ht 5\' 10"  (1.778 m)   Wt 54.2 kg (119 lb 8 oz)   SpO2 96%   BMI 17.15 kg/m  SpO2: SpO2: 96 % O2 Device: O2 Device: Room Air O2 Flow Rate:    Intake/output summary:  Intake/Output Summary (Last 24 hours) at 05/03/2017 1153 Last data filed at 05/03/2017 1610 Gross per 24 hour  Intake -  Output 700 ml  Net -700 ml   LBM: Last BM Date: 05/02/17 Baseline Weight: Weight: 56.7 kg (125 lb) Most recent weight: Weight: 54.2 kg (119 lb 8 oz)       Palliative Assessment/Data: PPS: 20%     Patient Active Problem List   Diagnosis Date Noted  . Pressure injury of skin 05/02/2017  . Severe protein-calorie malnutrition (HCC)   . Chronic UTI   . Advance care planning   . Goals of care, counseling/discussion   . Mild dementia 05/01/2017  . Protein-calorie malnutrition, severe 03/20/2017  . UTI (urinary tract infection) 03/17/2017  . Hyponatremia 03/17/2017  . BPH with obstruction/lower urinary tract symptoms 03/17/2017  . Diabetes (HCC) 03/17/2017  . HTN (hypertension) 03/17/2017  . GERD (gastroesophageal reflux disease) 03/17/2017  . HLD (hyperlipidemia) 03/17/2017    Palliative Care Assessment & Plan   Patient Profile: 84 y.o. male  with past medical history of  DM, GERD, HLD, HTN, BPH with indwelling foley cath admitted on 04/28/2017 with progressing weakness and leg edema. Workup reveals hyponatremia, severe protein calorie malnutrition, and UTI. This hospitalization was further complicated by an illeus which has now resolved. Patient refused NG tube. This is the third admission in the last 6 months for hyponatremia and UTI. Palliative medicine consulted for GOC.      Assessment/Recommendations/Plan   Plan for pt to be d/c to patient's Granddaughter's home with Hospice  Comfort feeding with acceptance of aspiration risk  Change diet to  thin liquids  Goals of Care and Additional Recommendations:  Limitations on Scope of Treatment: Avoid Hospitalization and Initiate Comfort Feeding  Code Status:  DNR  Prognosis:   < 6 months in setting of severe calorie malnutrition, dysphagia, chronic UTI with indwelling foley, recurrent hospital admissions, transition to comfort care, planned d/c home with Hospice  Discharge Planning:  Home with Hospice  Care plan was discussed with Dr. Elpidio [name] and patient's HCPOA- [name]  Thank you for allowing the Palliative Medicine Team to assist in the care of this patient.   Time In: 1125 Time Out: 1200 Total Time 35 mins Prolonged Time Billed  No       Greater than 50%  of this time was spent counseling and coordinating care related to the above assessment and plan.  [name], AGNP-C Palliative Medicine   Please contact Palliative Medicine Team phone at [phone] for questions and concerns.

## 2017-05-03 NOTE — Progress Notes (Signed)
New referral for [facility] at discharge received from [facility] following a Palliative [place] consult. Patient is an 84 year old man with a history of BPH (chronic foley placement), DM II, GERD, HLD and HTN admitted to [facility] on 2/22 for treatment of hyponatremia and UTI. He has received antibiotics, but has continued with poor oral intake and increased weakness. Speech Evaluation revealed dysphasia, diet changed to pureed with nectar thick liquids, which patient is declining to eat. Palliative Medicine was consulted for goals of care and met with patient's granddaughter, who has chosen to focus on patient's comfort at home with the support of hospice services. Patient seen sitting up in bed, alert, appears cachectic and pale. He is able to converse and answer questions, lunch tray at bedside. Writer offered to assist patient with his lunch, but her declined, stating he only wants to drink his water. Confirmed with Staff RN Gerald Stabs that patient was able to have regular thin water. Patient able to hold the cup and drink with a  Straw, he does cough after each sip. Patient to be discharged today. Writer placed a call to patient's granddaughter Delos Haring to initiate education regarding hospice services. Message left with writer's contact number to call. Hospital staff updated. Flo Shanks RN, BSN, [place] [facility] [place], hospital Liaison [phone]

## 2017-05-03 NOTE — Progress Notes (Signed)
Calorie Count Note  48 hour calorie count order was resumed on 2/26 when patient was put back on diet. Day 1 results below:  Diet: Dysphagia 1 with Nectar Thick liquids (Ensure Enlive approved by SLP) Supplements:  -Ensure Enlive po BID, each supplement provides 350 kcal and 20 grams of protein -Magic cup TID with meals, each supplement provides 290 kcal and 9 grams of protein  Lunch 2/26: N/A Dinner 2/26: 20 kcal, 1 gram of protein (5% vanilla Magic Cup, 5% puree chicken) Breakfast 2/27: 23 kcal, 0 grams of protein (33% nectar-thick orange juice) Supplements: 18 kcal, 1 gram of protein (5% bottle of Ensure Enlive strawberry on 2/26)  Total intake: 61 kcal (4% of minimum estimated needs)  2 grams of protein (3% of minimum estimated needs)  Estimated Nutritional Needs:  Kcal:  1470-1715 (MSJ x 1.2-1.4) Protein:  75-85 grams (1.4-1.6 grams/kg) Fluid:  1.3 L/day (25 mL/kg)  Nutrition Dx: Severe Malnutrition related to social / environmental circumstances(poor appetite, lived alone PTA, difficulty preparing meals, advanced age) as evidenced by severe fat depletion, severe muscle depletion.  Goal: Patient will meet greater than or equal to 90% of their needs  Intervention: -Noted plan is now for patient to discharge home with hospice and transition to comfort care at time of discharge -Recommend pleasure feeds as tolerated/requested by patient  Helane RimaLeanne Amisadai Woodford, MS, RD, LDN Office: [phone] Pager: [phone] After Hours/Weekend Pager: [phone]

## 2017-05-03 NOTE — Progress Notes (Addendum)
Please note per patient's granddaughter [name],  patient is discharging to her home at [address]  [facility] Steward DroneBrenda and staff RN Thayer OhmChris made aware.  [name] would also like a bed to be delivered tomorrow as she will be home. Referral made aware. Hospice services explained with good understanding voiced. Hospice information and contact numbers placed in discharge folder, [name] aware. Staff RN requested to contact [place] when EMS is notified for transport.  Dayna BarkerKaren Robertson RN, BSN, [facility] [place], hospital liaison [phone]

## 2017-05-03 NOTE — Progress Notes (Signed)
Discharge instructions reviewed with granddaughter [name] via telephone.

## 2017-05-03 NOTE — Care Management Important Message (Signed)
Important Message  Patient Details  Name: Christian LarocheClarence J Aguino MRN: [id number] Date of Birth: 05/15/1926   Medicare Important Message Given:  Yes    Gwenette GreetBrenda S Mionna Advincula, RN 05/03/2017, 7:51 AM

## 2017-05-03 NOTE — Care Management (Signed)
Discharge to home with hospice services in place per Dr. Elpidio AnisSudini. Spoke with family member, Buyer, retailBeth. Discussed Hospice agencies. [facility] [place]. Dayna BarkerKaren Robertson Rn representative updated. Gwenette GreetBrenda S Jamason Peckham RN MSN CCM Care Management [phone]

## 2017-05-04 NOTE — Discharge Summary (Signed)
[facility] [place] [facility]   PATIENT NAME: [name]    MR#:  [id number]  DATE OF BIRTH:  November 25, 1926  DATE OF ADMISSION:  04/28/2017 ADMITTING PHYSICIAN: Oralia Manis, MD  DATE OF DISCHARGE: 05/03/2017  5:00 PM  PRIMARY CARE PHYSICIAN: Lauro Regulus, MD   ADMISSION DIAGNOSIS:  Acute hyponatremia [E87.1] Acute encephalopathy [G93.40]  DISCHARGE DIAGNOSIS:  Principal Problem:   Mild dementia Active Problems:   UTI (urinary tract infection)   Hyponatremia   BPH with obstruction/lower urinary tract symptoms   Diabetes (HCC)   HTN (hypertension)   GERD (gastroesophageal reflux disease)   HLD (hyperlipidemia)   Pressure injury of skin   Severe protein-calorie malnutrition (HCC)   Chronic UTI   Advance care planning   Goals of care, counseling/discussion   Palliative care by specialist   SECONDARY DIAGNOSIS:   Past Medical History:  Diagnosis Date  . BPH (benign prostatic hyperplasia)   . Diabetes mellitus without complication (HCC)   . GERD (gastroesophageal reflux disease)   . HLD (hyperlipidemia)   . Hypertension      ADMITTING HISTORY  HISTORY OF PRESENT ILLNESS:  [name]  is a 84 y.o. male who presents with several days of increasing weakness, lower extremity swelling, foul-smelling urine.  Patient and daughter states that he has had very poor p.o. intake.  Here in the ED he was found to have hyponatremia.  Also found to have UTI.  Hospitalist were called for admission  HOSPITAL COURSE:   * Ileus Resolved.  2 bowel movements showed normal.  Tolerating diet without vomiting.  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  Improved.  Does have chronic hyponatremia. Appreciate nephrology help.   Sodium close to his baseline.  He said  *UTI (urinary tract infection), catheter related.  Stenotrophomonas and enterococcus -IV antibiotics with Zosyn Chronic Foley catheter Changed to  Levaquin orally at discharge  *BPH with urinary retention and chronic Foley catheter  *Diabetes (HCC) -sliding scale insulin with corresponding glucose checks  *HTN (hypertension)  blood pressure medications from home health.  Blood pressure normal.  *Severe protein calorie malnutrition.  Significant weight loss.  CT scan abdomen and pelvis checked and showed no malignancies.   Palliative care was consulted.  Discussed with patient and granddaughter who is his healthcare power of attorney.  Decision has been made to discharge patient home with hospice.  He has severe protein calorie malnutrition, failure to thrive.  High risk for aspiration.  Transition to end-of-life care with hospice if any deterioration which is likely in the near future.    CONSULTS OBTAINED:  Treatment Team:  Mosetta Pigeon, MD [place], Jackquline Denmark, MD  DRUG ALLERGIES:  No Known Allergies  DISCHARGE MEDICATIONS:   Allergies as of 05/03/2017   No Known Allergies     Medication List    STOP taking these medications   furosemide 20 MG tablet Commonly known as:  LASIX   lisinopril 10 MG tablet Commonly known as:  PRINIVIL,ZESTRIL   tamsulosin 0.4 MG Caps capsule Commonly known as:  FLOMAX     TAKE these medications   atorvastatin 20 MG tablet Commonly known as:  LIPITOR Take 20 mg by mouth daily.   ELIQUIS STARTER PACK 5 MG Tabs Take as directed on package: start with two-5mg  tablets twice daily for 7 days. On day 8, switch to one-5mg  tablet twice daily.   feeding supplement (ENSURE ENLIVE) Liqd Take 237 mLs by mouth 2 (two)  times daily between meals.   finasteride 5 MG tablet Commonly known as:  PROSCAR Take 1 tablet (5 mg total) by mouth daily.   latanoprost 0.005 % ophthalmic solution Commonly known as:  XALATAN Place 1 drop into both eyes at bedtime.   levofloxacin 500 MG tablet Commonly known as:  LEVAQUIN Take 1 tablet (500 mg total) by mouth daily.   potassium chloride 10  MEQ tablet Commonly known as:  K-DUR,KLOR-CON Take 10 mEq by mouth daily.   sodium chloride 1 g tablet Take 1 tablet (1 g total) by mouth 2 (two) times daily with a meal.       Today   VITAL SIGNS:  Blood pressure 137/74, pulse (!) 59, temperature 98 F (36.7 C), resp. rate 18, height 5\' 10"  (1.778 m), weight 54.2 kg (119 lb 8 oz), SpO2 98 %.  I/O:    Intake/Output Summary (Last 24 hours) at 05/04/2017 1417 Last data filed at 05/03/2017 1443 Gross per 24 hour  Intake 0 ml  Output -  Net 0 ml    PHYSICAL EXAMINATION:  Physical Exam  GENERAL:  84 y.o.-year-old patient lying in the bed with no acute distress.  LUNGS: Normal breath sounds bilaterally, no wheezing, rales,rhonchi or crepitation. No use of accessory muscles of respiration.  CARDIOVASCULAR: S1, S2 normal. No murmurs, rubs, or gallops.  ABDOMEN: Soft, non-tender, non-distended. Bowel sounds present. No organomegaly or mass.  NEUROLOGIC: Moves all 4 extremities. PSYCHIATRIC: The patient is alert and oriented x 3.  SKIN: No obvious rash, lesion, or ulcer.   DATA REVIEW:   CBC Recent Labs  Lab 05/03/17 0347  WBC 14.9*  HGB 11.8*  HCT 35.4*  PLT 244    Chemistries  Recent Labs  Lab 04/28/17 2151  05/01/17 0442  05/03/17 0347  NA 113*   < > 122*   < > 128*  K 2.8*   < >  --    < > 4.6  CL 78*   < >  --    < > 99*  CO2 23   < >  --    < > 22  GLUCOSE 182*   < >  --    < > 173*  BUN 24*   < >  --    < > 37*  CREATININE 1.01   < >  --    < > 0.92  CALCIUM 7.8*   < >  --    < > 7.7*  MG  --    < > 2.4  --   --   AST 61*  --   --   --   --   ALT 37  --   --   --   --   ALKPHOS 65  --   --   --   --   BILITOT 1.8*  --   --   --   --    < > = values in this interval not displayed.    Cardiac Enzymes No results for input(s): TROPONINI in the last 168 hours.  Microbiology Results  Results for orders placed or performed during the hospital encounter of 04/28/17  Urine Culture     Status: Abnormal    Collection Time: 04/29/17  1:03 AM  Result Value Ref Range Status   Specimen Description   Final    URINE, RANDOM Performed at [facility], [address] [place], [place] [address]    Special Requests   Final  NONE Performed at [facility], [address] [place], [place] [address]    Culture (A)  Final    >=100,000 COLONIES/mL STENOTROPHOMONAS MALTOPHILIA >=100,000 COLONIES/mL ENTEROCOCCUS FAECALIS    Report Status 05/01/2017 FINAL  Final   Organism ID, Bacteria ENTEROCOCCUS FAECALIS (A)  Final   Organism ID, Bacteria STENOTROPHOMONAS MALTOPHILIA (A)  Final      Susceptibility   Enterococcus faecalis - MIC*    AMPICILLIN <=2 SENSITIVE Sensitive     LEVOFLOXACIN 0.5 SENSITIVE Sensitive     NITROFURANTOIN <=16 SENSITIVE Sensitive     VANCOMYCIN 1 SENSITIVE Sensitive     * >=100,000 COLONIES/mL ENTEROCOCCUS FAECALIS   Stenotrophomonas maltophilia - MIC*    LEVOFLOXACIN 2 SENSITIVE Sensitive     TRIMETH/SULFA <=20 SENSITIVE Sensitive     * >=100,000 COLONIES/mL STENOTROPHOMONAS MALTOPHILIA    RADIOLOGY:  No results found.  Follow up with PCP in 1 week.  Management plans discussed with the patient, family and they are in agreement.  CODE STATUS:  Code Status History    Date Active Date Inactive Code Status Order ID Comments User Context   04/29/2017 02:39 05/03/2017 21:39 DNR [id number]  Oralia Manis, MD Inpatient   04/06/2017 15:20 04/07/2017 21:57 DNR [id number]  Shaune Pollack, MD Inpatient   03/17/2017 22:50 03/20/2017 21:12 Full Code [id number]  Oralia Manis, MD Inpatient    Questions for Most Recent Historical Code Status (Order [id number])    Question Answer Comment   In the event of cardiac or respiratory ARREST Do not call a "code blue"    In the event of cardiac or respiratory ARREST Do not perform Intubation, CPR, defibrillation or ACLS    In the event of cardiac or respiratory ARREST Use medication by any route, position, wound care, and other  measures to relive pain and suffering. May use oxygen, suction and manual treatment of airway obstruction as needed for comfort.         Advance Directive Documentation     Most Recent Value  Type of Advance Directive  Healthcare Power of Attorney  Pre-existing out of facility DNR order (yellow form or pink MOST form)  No data  "MOST" Form in Place?  No data      TOTAL TIME TAKING CARE OF THIS PATIENT ON DAY OF DISCHARGE: more than 30 minutes.   Molinda Bailiff Roc Streett M.D on 05/04/2017 at 2:17 PM  Between 7am to 6pm - Pager - [phone]  After 6pm go to [email or web address] - password [email or web address] [facility]  [facility]  Office  [phone]  CC: Primary care physician; Lauro Regulus, MD  Note: This dictation was prepared with Dragon dictation along with smaller phrase technology. Any transcriptional errors that result from this process are unintentional.

## 2017-05-17 ENCOUNTER — Ambulatory Visit: Payer: Medicare Other | Admitting: Urology

## 2017-06-05 DEATH — deceased

## 2019-02-03 IMAGING — CR DG ELBOW COMPLETE 3+V*L*
4 series · 4 of 4 positions shown · non-contrast
Comparison: None.

CLINICAL DATA: Fell today with laceration and pain.

EXAM:
LEFT ELBOW - COMPLETE 3+ VIEW

[elbow ap]
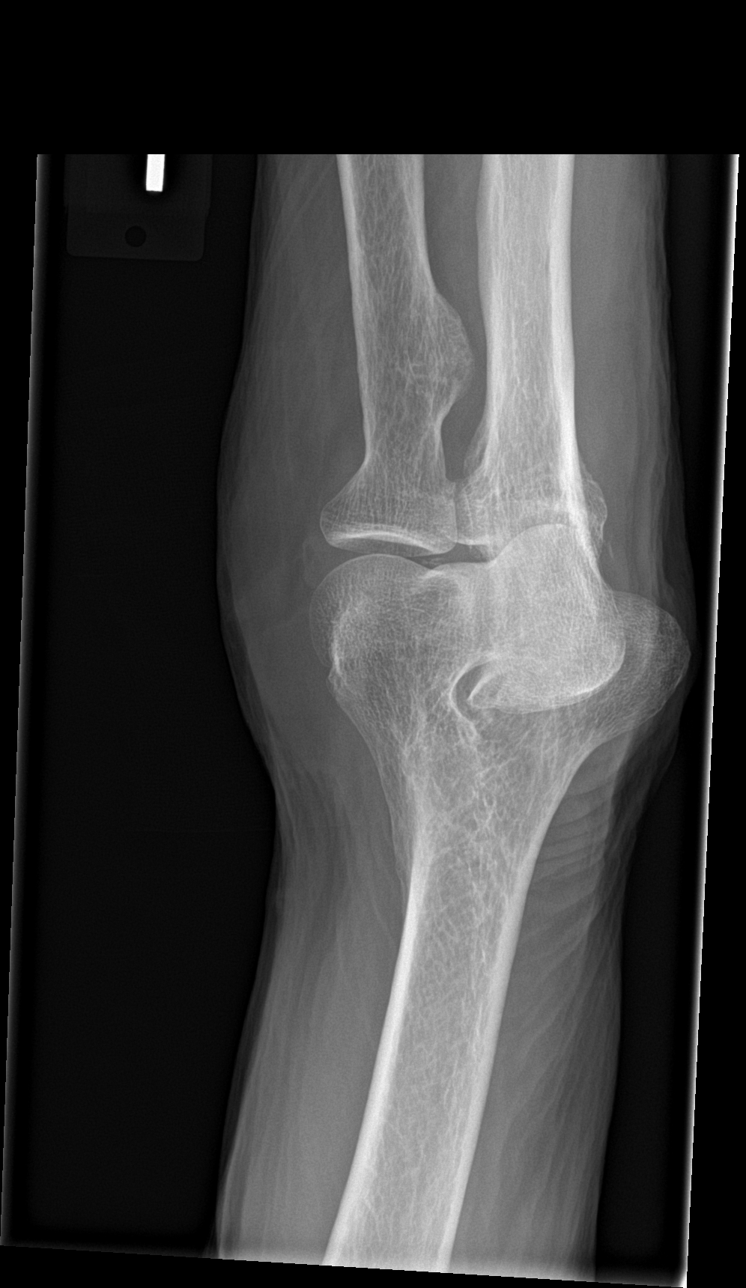

[elbow obl (1 of 2)]
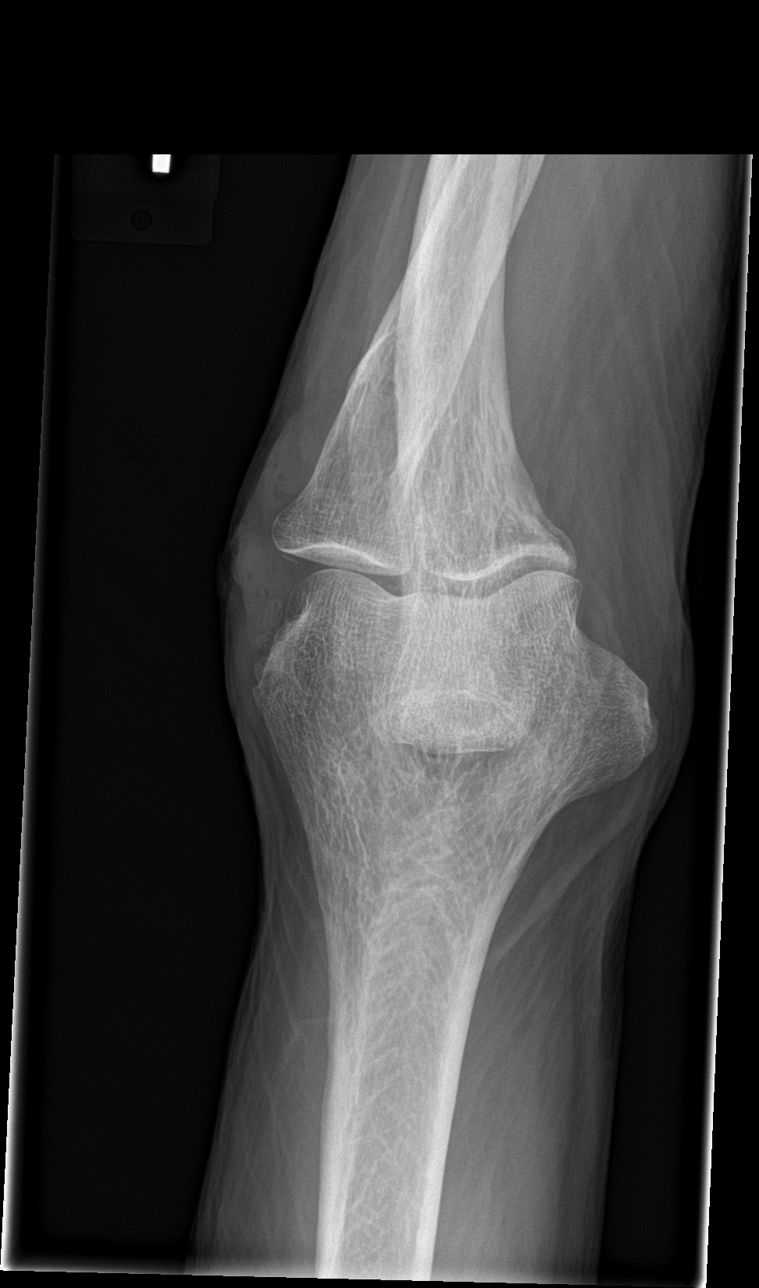

[elbow obl (2 of 2)]
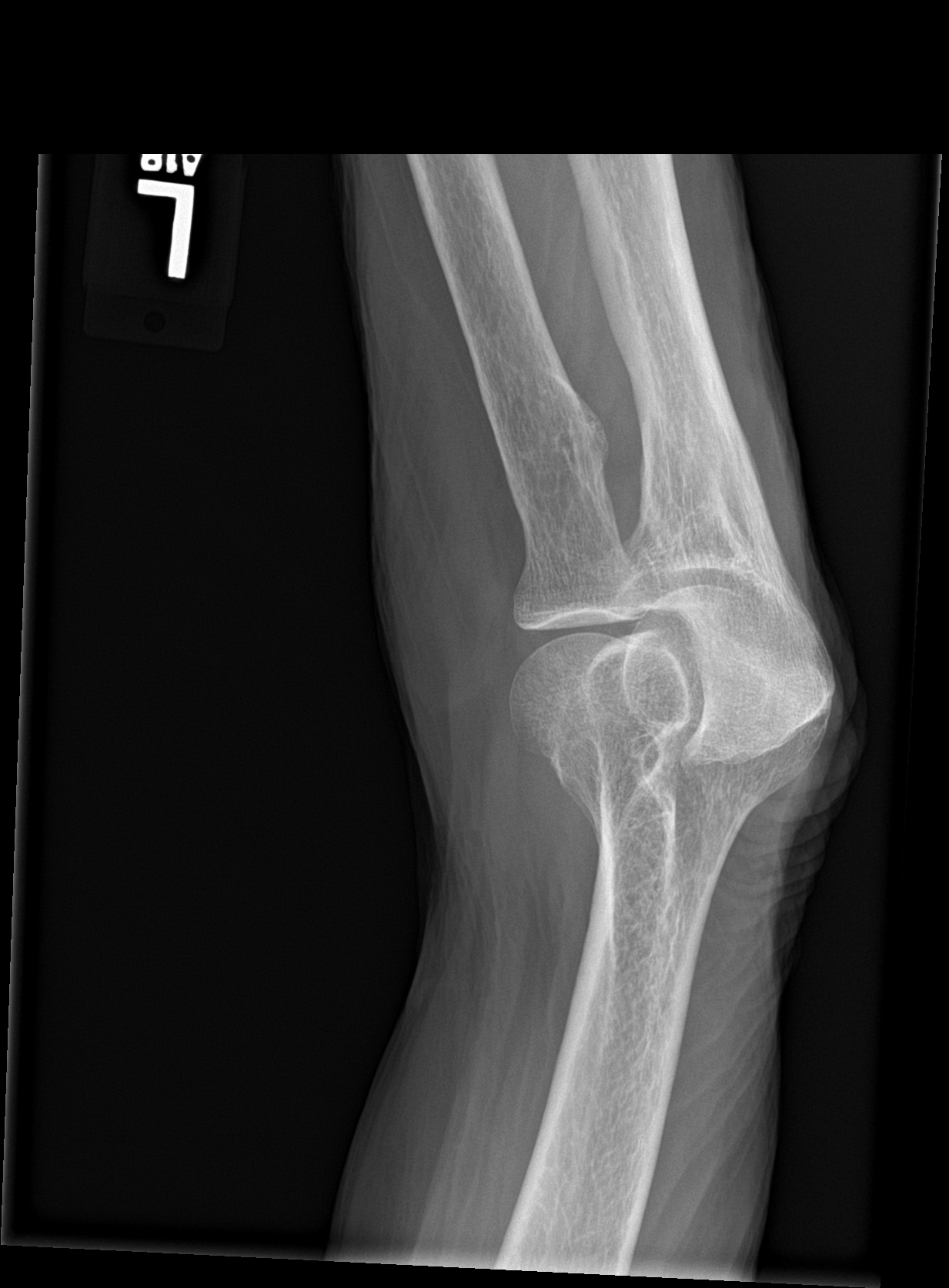

[elbow lat]
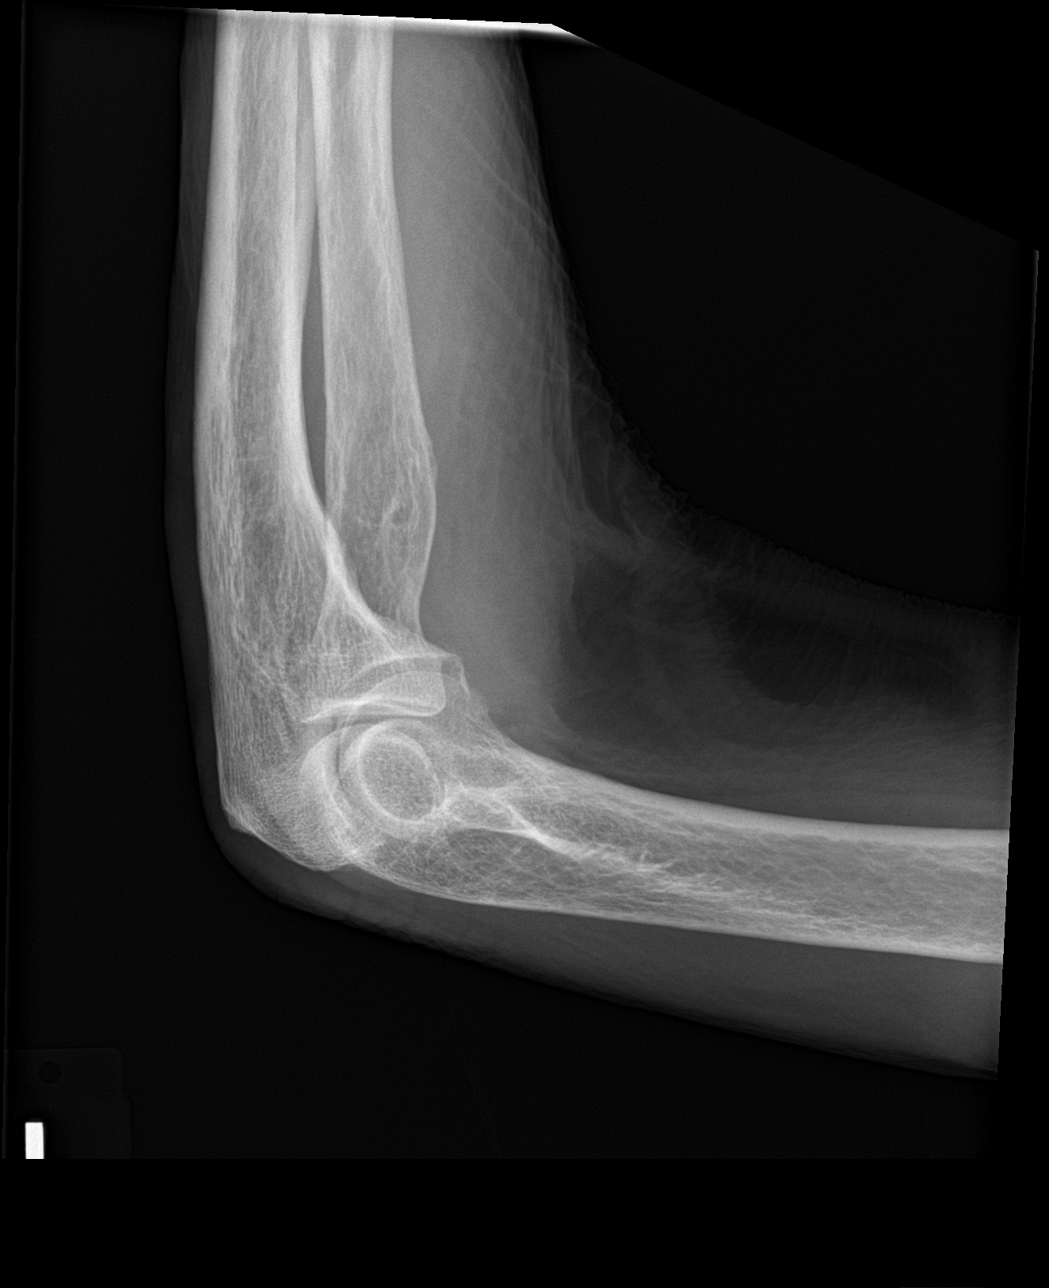

[4 of 4 positions shown; findings below may reference images not displayed]

FINDINGS: Lateral soft tissue deformity. No evidence of fracture, dislocation
or pre-existing degenerative change. Age related chondrocalcinosis.
IMPRESSION: Lateral soft tissue injury. No evidence of fracture, dislocation or
radiopaque foreign object.

## 2019-02-03 IMAGING — CT CT HEAD W/O CM
3 series · 16 of 47 positions shown, 19 images · non-contrast
Comparison: None.

CLINICAL DATA: The patient suffered an episode of dizziness with a
fall 10/20/2016. Initial encounter.

EXAM:
CT HEAD WITHOUT CONTRAST
TECHNIQUE: Contiguous axial images were obtained from the base of the skull
through the vertex without intravenous contrast.

[Series 2: head wo · axial · 0.44mm/px · z∈[-104,+26]mm · 10 of 32 slices shown, 13 images]
[im 3/32  brain]
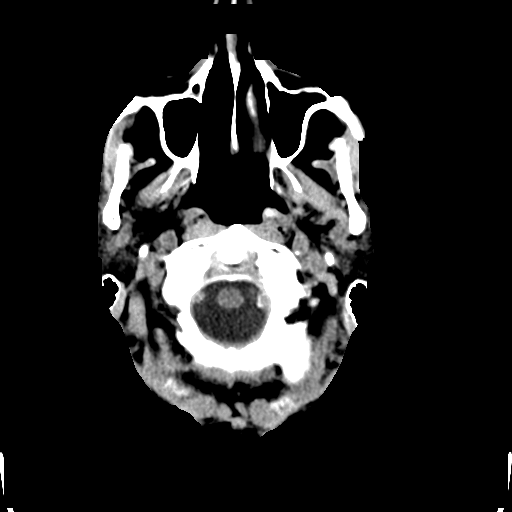
[im 3/32  bone]
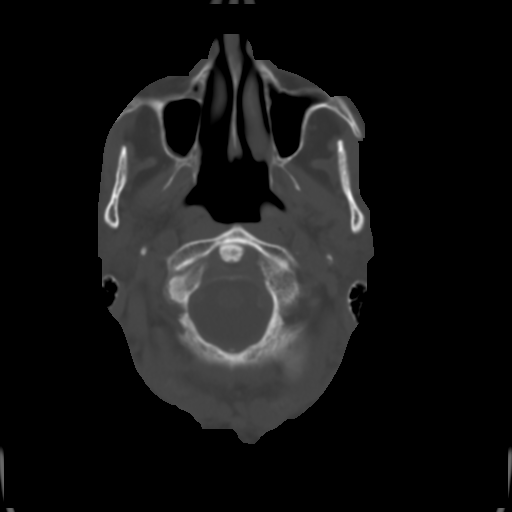
[im 6/32  brain]
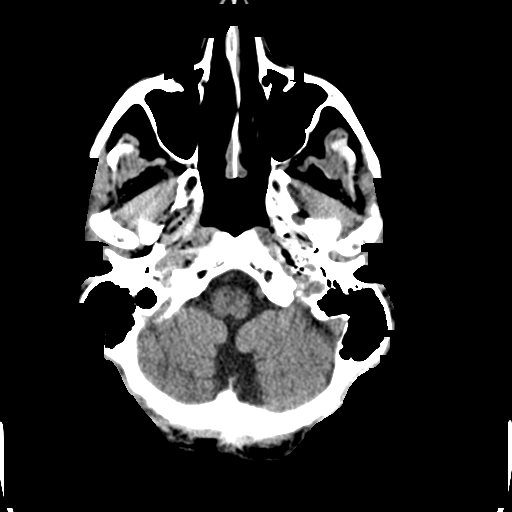
[im 9/32  brain]
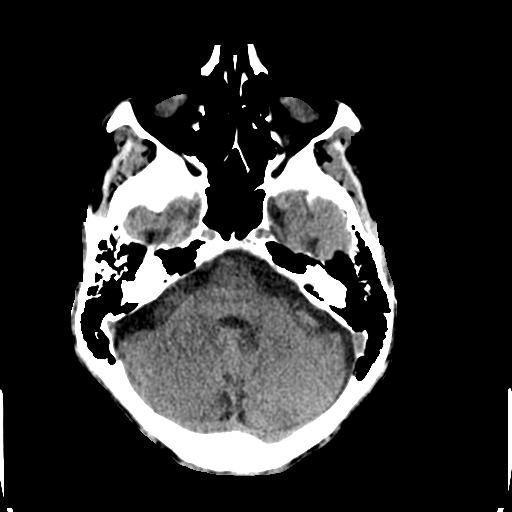
[im 11/32  brain]
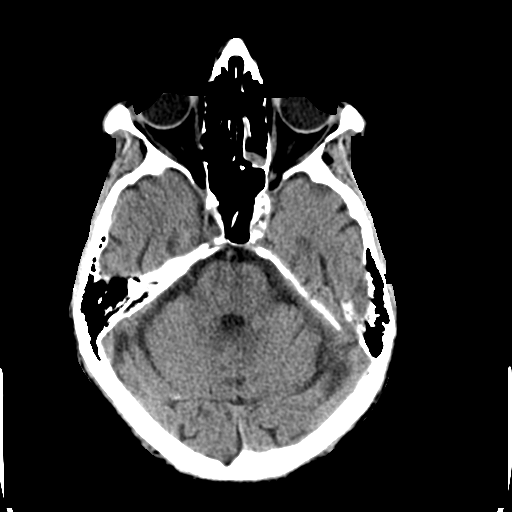
[im 14/32  brain]
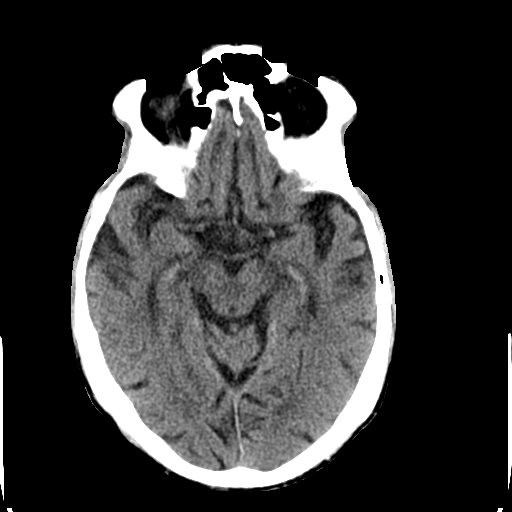
[im 14/32  bone]
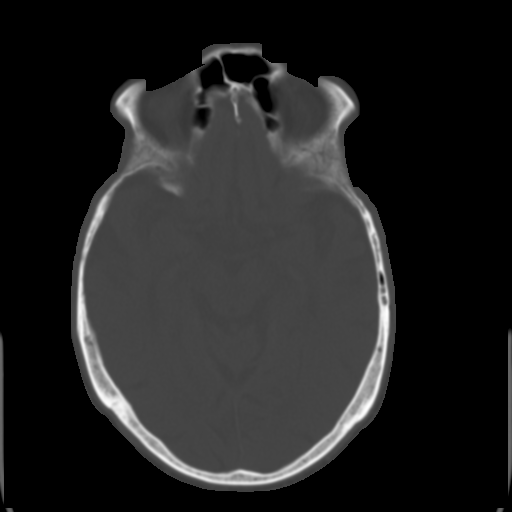
[im 18/32  brain]
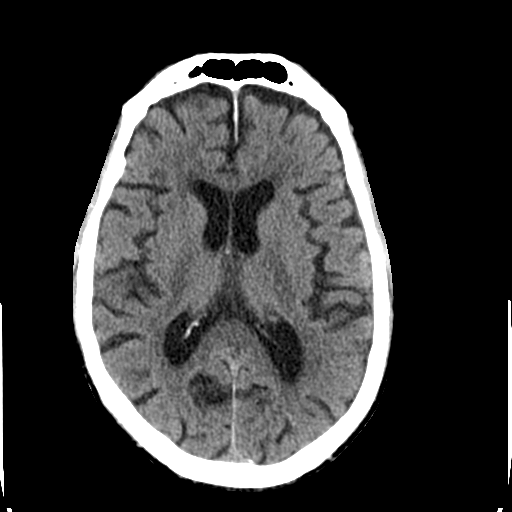
[im 21/32  brain]
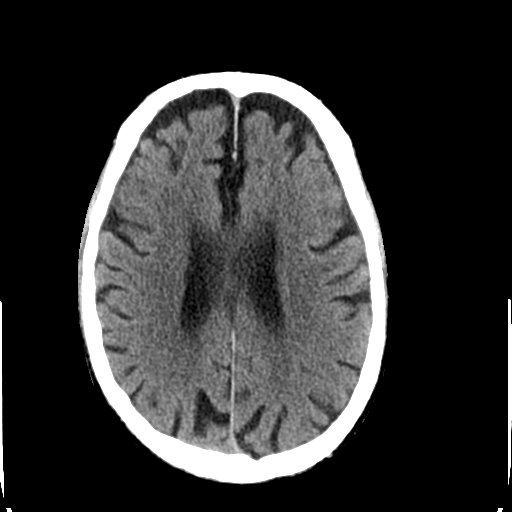
[im 24/32  brain]
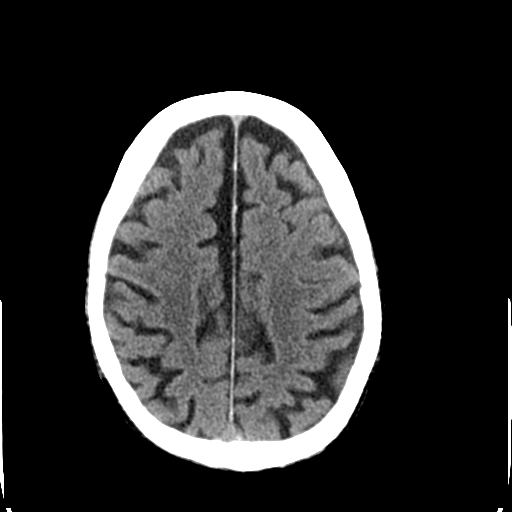
[im 26/32  brain]
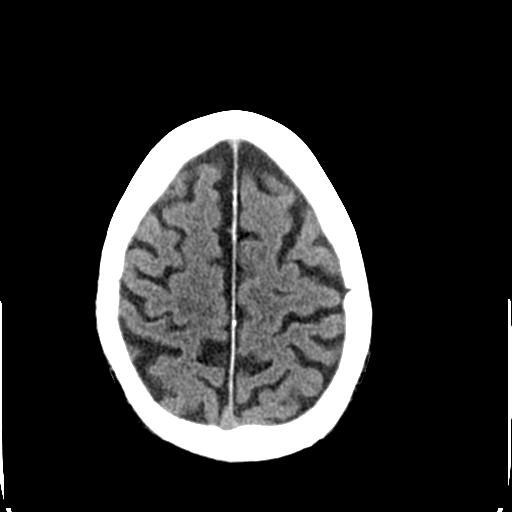
[im 26/32  bone]
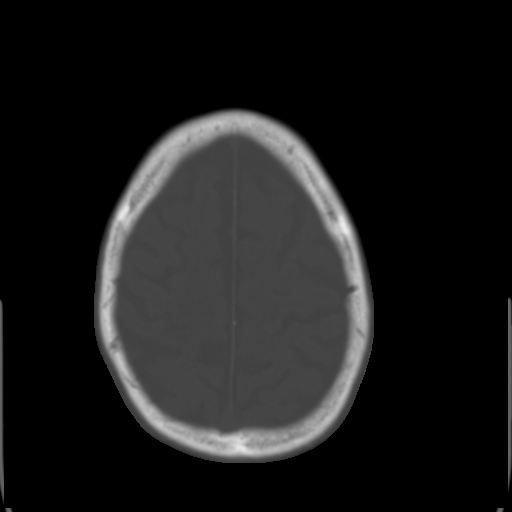
[im 29/32  brain]
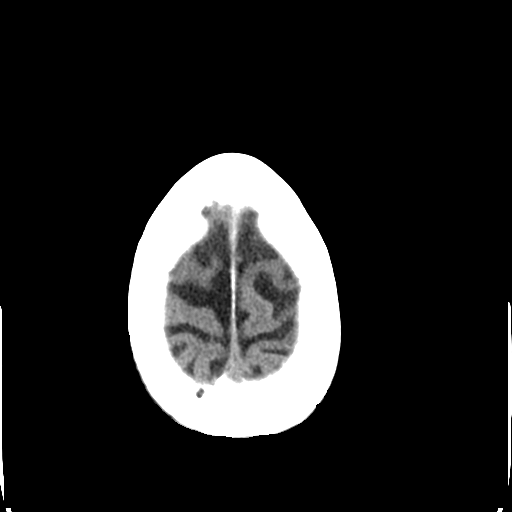

[Series 4: coronal soft tissue · coronal · 0.33mm/px · 3 of 74 slices shown]
[im 25/74  brain]
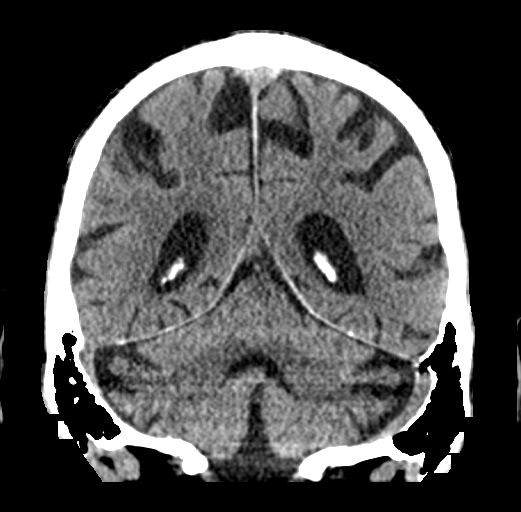
[im 33/74  brain]
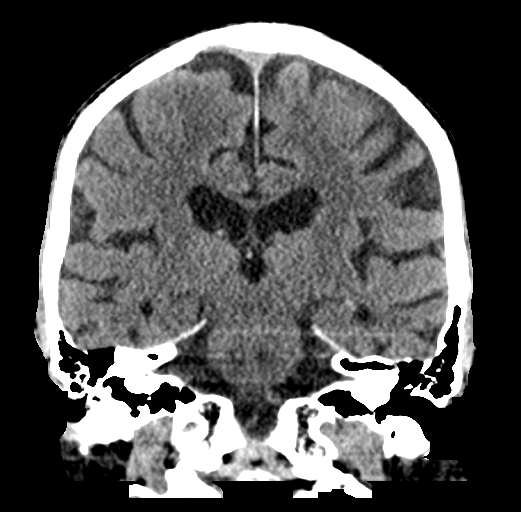
[im 41/74  brain]
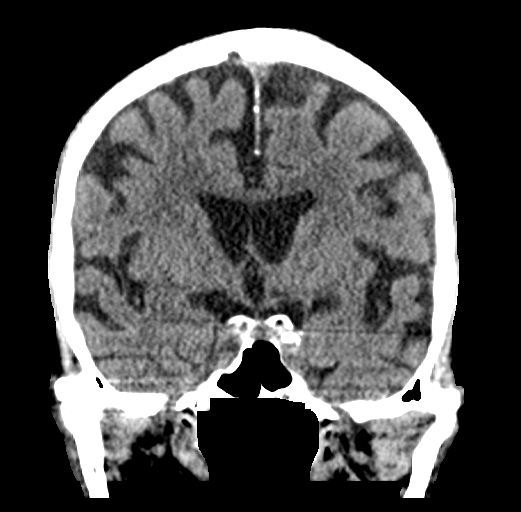

[Series 5: sagittal soft tissue · sagittal · 0.35mm/px · 3 of 55 slices shown]
[im 19/55  brain]
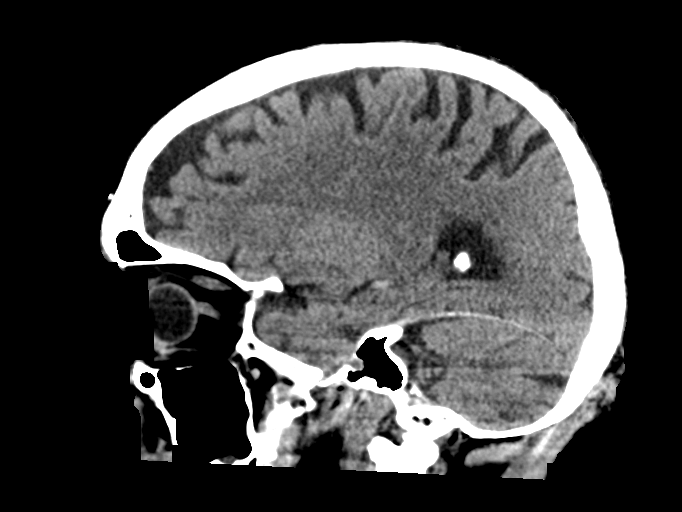
[im 28/55  brain]
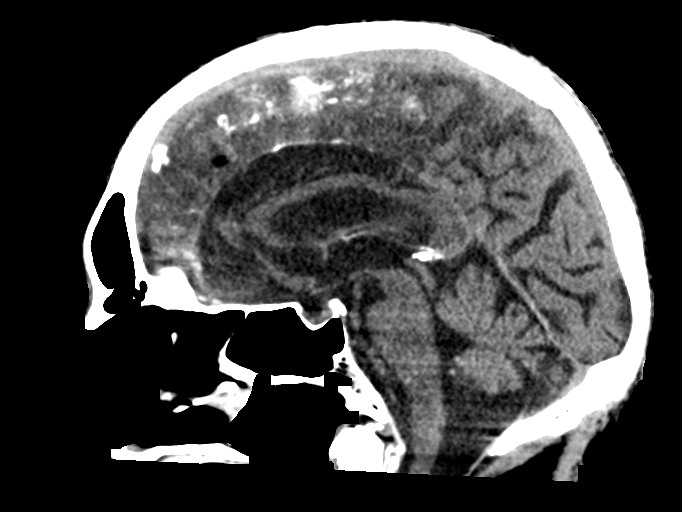
[im 37/55  brain]
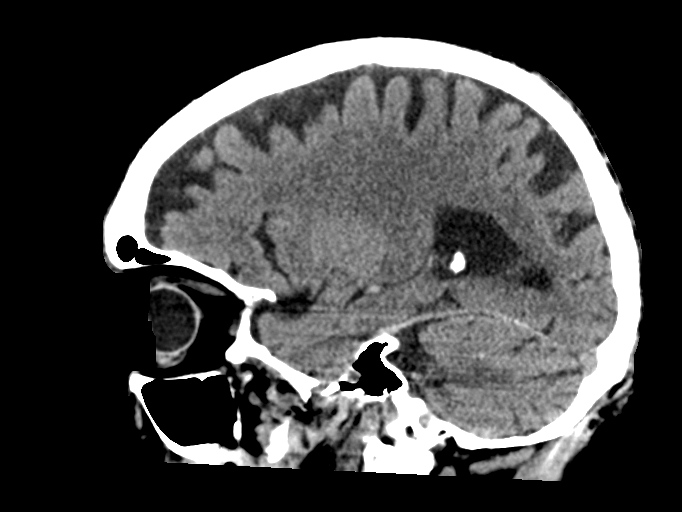

[16 of 47 positions shown; findings below may reference images not displayed]

FINDINGS: Brain: There is cortical atrophy and chronic microvascular ischemic
change. No evidence of acute intracranial abnormality including
hemorrhage, infarct, mass lesion, mass effect, midline shift or
abnormal extra-axial fluid collection. No hydrocephalus or
pneumocephalus.

Vascular: Atherosclerosis noted.

Skull: Intact.

Sinuses/Orbits: Minimal ethmoid air cell disease on the left noted.

Other: None.
IMPRESSION: No acute abnormality.

Atrophy and mild chronic microvascular ischemic change.

Atherosclerosis.
